# Patient Record
Sex: Female | Born: 1945 | Race: White | Hispanic: No | Marital: Married | Smoking: Former smoker
Health system: Southern US, Community
[De-identification: ages and names within clinical notes are randomized; demographics above are authoritative.]

## PROBLEM LIST (undated history)

## (undated) DIAGNOSIS — G43909 Migraine, unspecified, not intractable, without status migrainosus: Secondary | ICD-10-CM

## (undated) DIAGNOSIS — F1020 Alcohol dependence, uncomplicated: Secondary | ICD-10-CM

## (undated) DIAGNOSIS — E079 Disorder of thyroid, unspecified: Secondary | ICD-10-CM

## (undated) DIAGNOSIS — IMO0001 Reserved for inherently not codable concepts without codable children: Secondary | ICD-10-CM

## (undated) DIAGNOSIS — E039 Hypothyroidism, unspecified: Secondary | ICD-10-CM

## (undated) DIAGNOSIS — M1990 Unspecified osteoarthritis, unspecified site: Secondary | ICD-10-CM

## (undated) HISTORY — PX: KNEE ARTHROSCOPY: SHX127

## (undated) HISTORY — PX: JOINT REPLACEMENT: SHX530

## (undated) HISTORY — DX: Disorder of thyroid, unspecified: E07.9

## (undated) HISTORY — DX: Alcohol dependence, uncomplicated: F10.20

## (undated) HISTORY — PX: TONSILLECTOMY: SUR1361

## (undated) HISTORY — DX: Reserved for inherently not codable concepts without codable children: IMO0001

## (undated) HISTORY — DX: Unspecified osteoarthritis, unspecified site: M19.90

## (undated) HISTORY — PX: COLONOSCOPY: SHX5424

---

## 1975-12-04 HISTORY — PX: BREAST BIOPSY: SHX20

## 1975-12-04 HISTORY — PX: TUBAL LIGATION: SHX77

## 1977-12-03 DIAGNOSIS — G43909 Migraine, unspecified, not intractable, without status migrainosus: Secondary | ICD-10-CM

## 1977-12-03 HISTORY — DX: Migraine, unspecified, not intractable, without status migrainosus: G43.909

## 1998-03-16 ENCOUNTER — Other Ambulatory Visit: Admission: RE | Admit: 1998-03-16 | Discharge: 1998-03-16 | Payer: Self-pay | Admitting: Obstetrics and Gynecology

## 1999-03-21 ENCOUNTER — Other Ambulatory Visit: Admission: RE | Admit: 1999-03-21 | Discharge: 1999-03-21 | Payer: Self-pay | Admitting: Obstetrics and Gynecology

## 1999-12-29 ENCOUNTER — Ambulatory Visit (HOSPITAL_BASED_OUTPATIENT_CLINIC_OR_DEPARTMENT_OTHER): Admission: RE | Admit: 1999-12-29 | Discharge: 1999-12-29 | Payer: Self-pay | Admitting: Orthopedic Surgery

## 1999-12-29 ENCOUNTER — Encounter (INDEPENDENT_AMBULATORY_CARE_PROVIDER_SITE_OTHER): Payer: Self-pay | Admitting: *Deleted

## 2000-05-16 ENCOUNTER — Other Ambulatory Visit: Admission: RE | Admit: 2000-05-16 | Discharge: 2000-05-16 | Payer: Self-pay | Admitting: Obstetrics and Gynecology

## 2001-06-03 ENCOUNTER — Other Ambulatory Visit: Admission: RE | Admit: 2001-06-03 | Discharge: 2001-06-03 | Payer: Self-pay | Admitting: Obstetrics and Gynecology

## 2001-07-15 ENCOUNTER — Ambulatory Visit (HOSPITAL_COMMUNITY): Admission: RE | Admit: 2001-07-15 | Discharge: 2001-07-15 | Payer: Self-pay | Admitting: Plastic Surgery

## 2002-03-26 ENCOUNTER — Encounter (INDEPENDENT_AMBULATORY_CARE_PROVIDER_SITE_OTHER): Payer: Self-pay | Admitting: *Deleted

## 2002-07-03 ENCOUNTER — Other Ambulatory Visit: Admission: RE | Admit: 2002-07-03 | Discharge: 2002-07-03 | Payer: Self-pay | Admitting: Obstetrics and Gynecology

## 2002-12-03 HISTORY — PX: SHOULDER ARTHROSCOPY: SHX128

## 2003-03-31 ENCOUNTER — Encounter: Admission: RE | Admit: 2003-03-31 | Discharge: 2003-03-31 | Payer: Self-pay | Admitting: Orthopedic Surgery

## 2003-03-31 ENCOUNTER — Encounter: Payer: Self-pay | Admitting: Orthopedic Surgery

## 2003-08-03 ENCOUNTER — Other Ambulatory Visit: Admission: RE | Admit: 2003-08-03 | Discharge: 2003-08-03 | Payer: Self-pay | Admitting: Obstetrics and Gynecology

## 2004-08-10 ENCOUNTER — Other Ambulatory Visit: Admission: RE | Admit: 2004-08-10 | Discharge: 2004-08-10 | Payer: Self-pay | Admitting: Obstetrics and Gynecology

## 2004-12-27 ENCOUNTER — Ambulatory Visit: Payer: Self-pay | Admitting: Family Medicine

## 2005-04-26 ENCOUNTER — Ambulatory Visit: Payer: Self-pay | Admitting: Family Medicine

## 2005-06-12 ENCOUNTER — Ambulatory Visit: Payer: Self-pay | Admitting: Gastroenterology

## 2005-06-28 ENCOUNTER — Encounter (INDEPENDENT_AMBULATORY_CARE_PROVIDER_SITE_OTHER): Payer: Self-pay | Admitting: *Deleted

## 2005-06-28 ENCOUNTER — Ambulatory Visit: Payer: Self-pay | Admitting: Gastroenterology

## 2005-12-06 ENCOUNTER — Ambulatory Visit: Payer: Self-pay | Admitting: Family Medicine

## 2005-12-06 ENCOUNTER — Encounter: Admission: RE | Admit: 2005-12-06 | Discharge: 2005-12-06 | Payer: Self-pay | Admitting: Family Medicine

## 2005-12-26 ENCOUNTER — Other Ambulatory Visit: Admission: RE | Admit: 2005-12-26 | Discharge: 2005-12-26 | Payer: Self-pay | Admitting: Obstetrics and Gynecology

## 2006-06-20 ENCOUNTER — Ambulatory Visit: Payer: Self-pay | Admitting: Family Medicine

## 2006-07-03 ENCOUNTER — Ambulatory Visit: Payer: Self-pay | Admitting: Family Medicine

## 2007-07-15 ENCOUNTER — Encounter: Payer: Self-pay | Admitting: Family Medicine

## 2007-07-28 ENCOUNTER — Encounter (INDEPENDENT_AMBULATORY_CARE_PROVIDER_SITE_OTHER): Payer: Self-pay

## 2007-08-18 ENCOUNTER — Ambulatory Visit: Payer: Self-pay | Admitting: Family Medicine

## 2007-08-18 LAB — CONVERTED CEMR LAB
Glucose, Urine, Semiquant: NEGATIVE
Protein, U semiquant: NEGATIVE
WBC Urine, dipstick: NEGATIVE
pH: 7

## 2007-08-26 ENCOUNTER — Ambulatory Visit: Payer: Self-pay | Admitting: Family Medicine

## 2007-08-28 LAB — CONVERTED CEMR LAB
Basophils Absolute: 0 10*3/uL (ref 0.0–0.1)
Basophils Relative: 0.6 % (ref 0.0–1.0)
Bilirubin, Direct: 0.1 mg/dL (ref 0.0–0.3)
CO2: 29 meq/L (ref 19–32)
Creatinine, Ser: 0.9 mg/dL (ref 0.4–1.2)
Direct LDL: 105 mg/dL
HCT: 34.9 % — ABNORMAL LOW (ref 36.0–46.0)
Hemoglobin: 11.9 g/dL — ABNORMAL LOW (ref 12.0–15.0)
MCHC: 34.1 g/dL (ref 30.0–36.0)
Monocytes Absolute: 0.3 10*3/uL (ref 0.2–0.7)
Neutrophils Relative %: 60.8 % (ref 43.0–77.0)
Potassium: 4.6 meq/L (ref 3.5–5.1)
RDW: 12.5 % (ref 11.5–14.6)
Sodium: 143 meq/L (ref 135–145)
TSH: 0.81 microintl units/mL (ref 0.35–5.50)
Total Bilirubin: 0.8 mg/dL (ref 0.3–1.2)
Total CHOL/HDL Ratio: 2.5
Total Protein: 5.5 g/dL — ABNORMAL LOW (ref 6.0–8.3)

## 2007-09-23 ENCOUNTER — Encounter: Payer: Self-pay | Admitting: Family Medicine

## 2007-10-08 ENCOUNTER — Encounter: Payer: Self-pay | Admitting: Family Medicine

## 2007-10-16 ENCOUNTER — Telehealth: Payer: Self-pay | Admitting: Family Medicine

## 2008-09-03 DIAGNOSIS — M5137 Other intervertebral disc degeneration, lumbosacral region: Secondary | ICD-10-CM | POA: Insufficient documentation

## 2008-09-06 ENCOUNTER — Ambulatory Visit: Payer: Self-pay | Admitting: Family Medicine

## 2008-09-08 ENCOUNTER — Ambulatory Visit: Payer: Self-pay | Admitting: Family Medicine

## 2008-09-15 ENCOUNTER — Ambulatory Visit: Payer: Self-pay | Admitting: Family Medicine

## 2008-09-15 LAB — CONVERTED CEMR LAB
Nitrite: NEGATIVE
Specific Gravity, Urine: 1.015
WBC Urine, dipstick: NEGATIVE

## 2008-09-28 ENCOUNTER — Ambulatory Visit: Payer: Self-pay | Admitting: Family Medicine

## 2008-09-28 DIAGNOSIS — M479 Spondylosis, unspecified: Secondary | ICD-10-CM | POA: Insufficient documentation

## 2008-09-28 DIAGNOSIS — M899 Disorder of bone, unspecified: Secondary | ICD-10-CM | POA: Insufficient documentation

## 2008-09-28 DIAGNOSIS — M949 Disorder of cartilage, unspecified: Secondary | ICD-10-CM

## 2008-09-28 DIAGNOSIS — E039 Hypothyroidism, unspecified: Secondary | ICD-10-CM | POA: Insufficient documentation

## 2008-09-29 LAB — CONVERTED CEMR LAB
ALT: 32 units/L (ref 0–35)
AST: 34 units/L (ref 0–37)
Albumin: 3.7 g/dL (ref 3.5–5.2)
BUN: 16 mg/dL (ref 6–23)
Basophils Relative: 1 % (ref 0.0–3.0)
Chloride: 106 meq/L (ref 96–112)
Creatinine, Ser: 0.9 mg/dL (ref 0.4–1.2)
Direct LDL: 119.5 mg/dL
Eosinophils Absolute: 0.1 10*3/uL (ref 0.0–0.7)
Eosinophils Relative: 2.4 % (ref 0.0–5.0)
GFR calc non Af Amer: 68 mL/min
Glucose, Bld: 90 mg/dL (ref 70–99)
HCT: 37.6 % (ref 36.0–46.0)
MCV: 97.5 fL (ref 78.0–100.0)
Monocytes Relative: 9.5 % (ref 3.0–12.0)
Neutrophils Relative %: 47.8 % (ref 43.0–77.0)
RBC: 3.86 M/uL — ABNORMAL LOW (ref 3.87–5.11)
Total Protein: 5.9 g/dL — ABNORMAL LOW (ref 6.0–8.3)
WBC: 5.3 10*3/uL (ref 4.5–10.5)

## 2008-12-07 ENCOUNTER — Ambulatory Visit: Payer: Self-pay | Admitting: Family Medicine

## 2008-12-07 DIAGNOSIS — M129 Arthropathy, unspecified: Secondary | ICD-10-CM | POA: Insufficient documentation

## 2009-04-18 ENCOUNTER — Ambulatory Visit: Payer: Self-pay | Admitting: Internal Medicine

## 2009-04-18 DIAGNOSIS — J069 Acute upper respiratory infection, unspecified: Secondary | ICD-10-CM | POA: Insufficient documentation

## 2009-11-02 ENCOUNTER — Ambulatory Visit: Payer: Self-pay | Admitting: Family Medicine

## 2009-11-15 ENCOUNTER — Ambulatory Visit: Payer: Self-pay | Admitting: Family Medicine

## 2009-11-15 DIAGNOSIS — M19049 Primary osteoarthritis, unspecified hand: Secondary | ICD-10-CM | POA: Insufficient documentation

## 2009-11-21 LAB — CONVERTED CEMR LAB
ALT: 28 units/L (ref 0–35)
Albumin: 3.6 g/dL (ref 3.5–5.2)
Basophils Relative: 1.4 % (ref 0.0–3.0)
Bilirubin Urine: NEGATIVE
CO2: 30 meq/L (ref 19–32)
Chloride: 109 meq/L (ref 96–112)
Cholesterol: 204 mg/dL — ABNORMAL HIGH (ref 0–200)
Direct LDL: 104.6 mg/dL
Eosinophils Absolute: 0.1 10*3/uL (ref 0.0–0.7)
Eosinophils Relative: 3.3 % (ref 0.0–5.0)
HCT: 35.4 % — ABNORMAL LOW (ref 36.0–46.0)
Hemoglobin, Urine: NEGATIVE
Hemoglobin: 12 g/dL (ref 12.0–15.0)
MCHC: 33.9 g/dL (ref 30.0–36.0)
MCV: 97.2 fL (ref 78.0–100.0)
Monocytes Absolute: 0.4 10*3/uL (ref 0.1–1.0)
Neutro Abs: 2.2 10*3/uL (ref 1.4–7.7)
Nitrite: NEGATIVE
Potassium: 4.4 meq/L (ref 3.5–5.1)
RBC: 3.64 M/uL — ABNORMAL LOW (ref 3.87–5.11)
Sodium: 142 meq/L (ref 135–145)
Total CHOL/HDL Ratio: 2
Total Protein, Urine: NEGATIVE mg/dL
Total Protein: 5.9 g/dL — ABNORMAL LOW (ref 6.0–8.3)
Urine Glucose: NEGATIVE mg/dL
Urobilinogen, UA: 0.2 (ref 0.0–1.0)
VLDL: 4.8 mg/dL (ref 0.0–40.0)
WBC: 4 10*3/uL — ABNORMAL LOW (ref 4.5–10.5)

## 2010-02-15 ENCOUNTER — Ambulatory Visit: Payer: Self-pay | Admitting: Family Medicine

## 2010-02-15 DIAGNOSIS — K589 Irritable bowel syndrome without diarrhea: Secondary | ICD-10-CM | POA: Insufficient documentation

## 2010-02-15 DIAGNOSIS — K6289 Other specified diseases of anus and rectum: Secondary | ICD-10-CM | POA: Insufficient documentation

## 2010-03-07 ENCOUNTER — Telehealth: Payer: Self-pay | Admitting: Family Medicine

## 2010-03-08 ENCOUNTER — Telehealth: Payer: Self-pay | Admitting: Internal Medicine

## 2010-03-14 ENCOUNTER — Ambulatory Visit: Payer: Self-pay | Admitting: Internal Medicine

## 2010-03-14 DIAGNOSIS — Z860100 Personal history of colon polyps, unspecified: Secondary | ICD-10-CM | POA: Insufficient documentation

## 2010-03-14 DIAGNOSIS — R159 Full incontinence of feces: Secondary | ICD-10-CM | POA: Insufficient documentation

## 2010-03-14 DIAGNOSIS — K625 Hemorrhage of anus and rectum: Secondary | ICD-10-CM | POA: Insufficient documentation

## 2010-03-16 DIAGNOSIS — R198 Other specified symptoms and signs involving the digestive system and abdomen: Secondary | ICD-10-CM | POA: Insufficient documentation

## 2010-03-31 ENCOUNTER — Ambulatory Visit: Payer: Self-pay | Admitting: Internal Medicine

## 2010-03-31 LAB — HM COLONOSCOPY

## 2010-08-03 ENCOUNTER — Telehealth: Payer: Self-pay | Admitting: Family Medicine

## 2010-08-21 ENCOUNTER — Encounter: Payer: Self-pay | Admitting: Family Medicine

## 2010-09-01 ENCOUNTER — Encounter: Payer: Self-pay | Admitting: Family Medicine

## 2010-10-05 ENCOUNTER — Telehealth: Payer: Self-pay | Admitting: Family Medicine

## 2010-10-18 ENCOUNTER — Ambulatory Visit (HOSPITAL_BASED_OUTPATIENT_CLINIC_OR_DEPARTMENT_OTHER): Admission: RE | Admit: 2010-10-18 | Discharge: 2010-10-18 | Payer: Self-pay | Admitting: Orthopedic Surgery

## 2011-01-02 NOTE — Procedures (Signed)
Summary: Sanda Klein, MD   Colonoscopy  Procedure date:  06/28/2010  Findings:      Location:  [facility].  Results: Normal.  Patient Name: Erin Carroll, Erin Carroll MRN: [id number] Procedure Procedures: Colonoscopy CPT: [phone].  Personnel: Endoscopist: Ulyess Mort, MD.  Exam Location: Exam performed in Outpatient Clinic. Outpatient  Patient Consent: Procedure, Alternatives, Risks and Benefits discussed, consent obtained, from patient. Consent was obtained by the RN.  Indications  Surveillance of: Adenomatous Polyp(s).  History  Current Medications: Patient is not currently taking Coumadin.  Pre-Exam Physical: Performed Mar 26, 2002. Entire physical exam was normal. Rectal exam, Abdominal exam, Extremity exam, Mental status exam WNL.  Exam Exam: Extent of exam reached: Cecum, extent intended: Cecum.  The cecum was identified by appendiceal orifice and IC valve. Colon retroflexion performed. Images were not taken. ASA Classification: II. Tolerance: excellent.  Monitoring: Pulse and BP monitoring, Oximetry used. Supplemental O2 given.  Sedation Meds: Patient assessed and found to be appropriate for moderate (conscious) sedation. Fentanyl given IV. Versed given IV.  Findings - NOT SEEN ON EXAM: Cecum to Rectum. Polyps, AVM's, Colitis, Tumors, Melanosis, Crohn's, Diverticulosis,   Assessment Normal examination.  Events  Unplanned Interventions: No intervention was required.  Unplanned Events: There were no complications. Plans Medication Plan: Continue current medications.  Patient Education: Patient given standard instructions for: a normal exam. Yearly hemoccult testing recommended. Patient instructed to get routine colonoscopy every 5 years.  Disposition: After procedure patient sent to recovery. After recovery patient sent home.   This report was created from the original endoscopy report, which was reviewed and signed by the  above listed endoscopist.   cc:  Dianna Limbo, MD

## 2011-01-02 NOTE — Progress Notes (Signed)
Summary: rectal leakage not better LMTCB 4-5  Phone Note Call from Patient Call back at [facility] Phone [phone]   Summary of Call: Rectal leakage not significantly better on Hyoscyamine.  Also now seeing an occasional smear of blood on toliet paper, red & not old blood.  No pain.  Still urgency at times & then leakage.  Some leakage without urgency.  No fever.  No constipation.  Not diarrhea, but loose.  3BM's in am, a change for her over time.  [facility]  NKDA.   Initial call taken by: Rudy Jew, RN,  March 07, 2010 2:58 PM  Follow-up for Phone Call        will refer to GI doctor --has she been seen by GI for anything in past if we will use same one  Follow-up by: Pura Spice, RN,  March 07, 2010 3:05 PM  Additional Follow-up for Phone Call Additional follow up Details #1::        No answer. Rudy Jew, RN  March 07, 2010 3:10 PM Left message to call back. Rudy Jew, RN  March 07, 2010 3:39 PM      Additional Follow-up for Phone Call Additional follow up Details #2::    pt returned call and per dr Scotty Court refer to Dr Marina Goodell. Pt aware that Camelia Eng will call with appt.  Follow-up by: Pura Spice, RN,  March 08, 2010 11:06 AM

## 2011-01-02 NOTE — Procedures (Signed)
Summary: Colonoscopy  Patient: Erin Carroll Note: All result statuses are Final unless otherwise noted.  Tests: (1) Colonoscopy (COL)   COL Colonoscopy           DONE     [facility]     [address]     [place], [place]  [address]           COLONOSCOPY PROCEDURE REPORT           PATIENT:  [name], [name]  MR#:  [id number]     BIRTHDATE:  [birth year], 64 yrs. old  GENDER:  female     ENDOSCOPIST:  Wilhemina Bonito. Eda Keys, MD     REF. BY:  Office     PROCEDURE DATE:  03/31/2010     PROCEDURE:  Surveillance Colonoscopy     ASA CLASS:  Class II     INDICATIONS:  history of pre-cancerous (adenomatous) colon polyps     ; INDEX 2003 W/ ADENOMA; F/U 2006 NEG.     ALSO C/O INCONTINENCE     MEDICATIONS:   Fentanyl 75 mcg IV, Versed 9 mg IV           DESCRIPTION OF PROCEDURE:   After the risks benefits and     alternatives of the procedure were thoroughly explained, informed     consent was obtained.  Digital rectal exam was performed and     revealed no abnormalities.   The LB CF-[id number] endoscope     was introduced through the anus and advanced to the cecum, which     was identified by both the appendix and ileocecal valve, without     limitations.TIME TO CECUM = 3:38 MIN. The quality of the prep was     good, using MoviPrep.  The instrument was then slowly withdrawn     (TIME = 10:15 MIN.)as the colon was fully examined.     <<PROCEDUREIMAGES>>           FINDINGS:  A normal appearing cecum, ileocecal valve, and     appendiceal orifice were identified. The ascending, hepatic     flexure, transverse, splenic flexure, descending, sigmoid colon,     and rectum appeared unremarkable.  No polyps or cancers were seen.     Retroflexed views in the rectum revealed no abnormalities.    The     scope was then withdrawn from the patient and the procedure     completed.           COMPLICATIONS:  None     ENDOSCOPIC IMPRESSION:     1) Normal colon     2) No polyps or cancers  RECOMMENDATIONS:     1) Follow up colonoscopy in 5 years           ______________________________     Wilhemina Bonito. Eda Keys, MD           CC:  Dianna Limbo, MD;The Patient           n.     Rosalie DoctorWilhemina Bonito. Eda Keys at 03/31/2010 05:09 PM           Golden Hurter, [phone]  Note: An exclamation mark (!) indicates a result that was not dispersed into the flowsheet. Document Creation Date: 03/31/2010 5:09 PM _______________________________________________________________________  (1) Order result status: Final Collection or observation date-time: 03/31/2010 17:02 Requested date-time:  Receipt date-time:  Reported date-time:  Referring Physician:   Ordering Physician: Fransico Setters [phone]) Specimen Source:  Source: Launa Grill Order Number: [id number] Lab site:   Appended Document: Colonoscopy    Clinical Lists Changes  Observations: Added new observation of COLONNXTDUE: 03/2015 (03/31/2010 17:48)      Appended Document: Colonoscopy reviewed

## 2011-01-02 NOTE — Letter (Signed)
Summary: [facility]  [facility]   Imported By: Maryln Gottron 08/28/2010 15:44:26  _____________________________________________________________________  External Attachment:    Type:   Image     Comment:   External Document

## 2011-01-02 NOTE — Progress Notes (Signed)
Summary: Vitamin D level  Phone Note Call from Patient Call back at Home Phone [phone] Call back at Work Phone [phone]   Caller: Patient Call For: Judithann Sheen MD Summary of Call: Pt needs to know her last Vitamin D level.  Initial call taken by: Lynann Beaver CMA,  August 03, 2010 10:14 AM  Follow-up for Phone Call        last vit d level was in 2009 and was at 35 no tx was needed thanks Follow-up by: Pura Spice, RN,  August 03, 2010 10:39 AM  Additional Follow-up for Phone Call Additional follow up Details #1::        Left message on personal voice mai. Additional Follow-up by: Lynann Beaver CMA,  August 03, 2010 10:44 AM

## 2011-01-02 NOTE — Progress Notes (Signed)
Summary: Celebrex refill  Phone Note Call from Patient   Caller: Patient Call For: Judithann Sheen MD Summary of Call: [facility]) [phone] Needs Celebrex refill ASAP!  She works at [employer] and is almost out of her meds.  Please call if you have any questions. Initial call taken by: [facility] CMA AAMA,  October 05, 2010 2:38 PM    Prescriptions: CELEBREX 200 MG CAPS (CELECOXIB) 1 capsule two times a day  #180 Capsule x 0   Entered by:   Josph Macho RMA   Authorized by:   Danise Edge MD   Signed by:   Josph Macho RMA on 10/05/2010   Method used:   [facility]ronically to        [facility] [phone]* (retail)       [address]       [place], [place]  [address]       Ph: [phone]       Fax: [phone]   RxID:   [id number]   Appended Document: Celebrex refill Patient informed and states its there

## 2011-01-02 NOTE — Assessment & Plan Note (Signed)
Summary: GI CONCERNS / PAIN / BLOATING // RS   Vital Signs:  Patient profile:   65 year old female Weight:      118 pounds O2 Sat:      95120 % Temp:     98 degrees F Pulse rate:   80 / minute BP sitting:   120 / 76  (left arm) Cuff size:   regular  Vitals Entered By: Pura Spice, RN (February 15, 2010 2:25 PM) CC: rectal incont.    History of Present Illness: This 65 year old white married female is in today complaining of light rectal incontinence or seepage of stool when she when she bends over or has any abdominal pressure she also complaints of some ROM and hand slight discomfort over the lower abdomen. As several bowel movements during the day multiple culture and formed stool however with his seepage he has some mucus with the stool, has not seen any blood at any time. Lungs Examination is up-to-date there no upper GI symptoms  Allergies (verified): No Known Drug Allergies  Past History:  Past Medical History: Last updated: 06/17/2007 Thyroid problems  Past Surgical History: Last updated: 06/17/2007 Lt knee arthroscopy-1987/1991 Lt Shoulder arthroscopy-2004 Breast bx-1977 Colonoscopy-06/2005  Social History: Last updated: 04/18/2009 Married Former Smoker Alcohol use-no Drug use-no Regular exercise-yes Occupation:the pharmacist    Risk Factors: Smoking Status: quit (04/18/2009) Passive Smoke Exposure: no (04/18/2009)  Review of Systems  The patient denies anorexia, fever, weight loss, weight gain, vision loss, decreased hearing, hoarseness, chest pain, syncope, dyspnea on exertion, peripheral edema, prolonged cough, headaches, hemoptysis, abdominal pain, melena, hematochezia, severe indigestion/heartburn, hematuria, incontinence, genital sores, muscle weakness, suspicious skin lesions, transient blindness, difficulty walking, depression, unusual weight change, abnormal bleeding, enlarged lymph nodes, angioedema, breast masses, and testicular masses.     Physical Exam  General:  Well-developed,well-nourished,in no acute distress; alert,appropriate and cooperative throughout examination Lungs:  Normal respiratory effort, chest expands symmetrically. Lungs are clear to auscultation, no crackles or wheezes. Heart:  Normal rate and regular rhythm. S1 and S2 normal without gallop, murmur, click, rub or other extra sounds. 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 1 qd  Complete Medication List: 1)  Meloxicam 15 Mg Tabs (Meloxicam) .... One by mouth daily 2)  Synthroid 75 Mcg Tabs (Levothyroxine sodium) .Marland Kitchen.. 1 qd 3)  Hyoscyamine Sulfate 0.125 Mg Tabs (Hyoscyamine sulfate) .Marland Kitchen.. 1 morn, luch and bedtime for irritable bowel  Patient Instructions: 1)  I feel the rectal seepage is secondary to beginning year-old bowel syndrome and possible early proctitis if there is new improvement we'll treat the proctitis with Analpram-HC crem 2)  Continue eating yogurt 3)  Start Hyoscamine .125 mg  3 times daily before meals Prescriptions: HYOSCYAMINE SULFATE 0.125 MG TABS (HYOSCYAMINE SULFATE) 1 morn, luch and bedtime for irritable bowel  #90 x 1   Entered and Authorized by:   Ivar Drape  Danice Goltz MD   Signed by:   Judithann Sheen MD on 02/15/2010   Method used:   Electronically to         [facility] [phone]* (retail)       [address]       [place], [place]  [address]       Ph: [phone]       Fax: [phone]   RxID:   [id number]

## 2011-01-02 NOTE — Procedures (Signed)
Summary: Sanda Klein, MD   Colonoscopy  Procedure date:  03/26/2002  Findings:      [facility]  Pathology:  Adenomatous polyp.          Procedures Next Due Date:    Colonoscopy: 04/2005 Patient Name: Erin Carroll, Erin Carroll MRN: [id number] Procedure Procedures: Colonoscopy CPT: 72536.    with Hot Biopsy(s)CPT: Z451292.  Colorectal cancer screening, average risk CPT: G0121.  Personnel: Endoscopist: Ulyess Mort, MD.  Referred By: Rogene Houston, MD.  Exam Location: Exam performed in Outpatient Clinic. Outpatient  Patient Consent: Procedure, Alternatives, Risks and Benefits discussed, consent obtained, from patient. Consent was obtained by the RN.  Indications  Average Risk Screening Routine.  History  Pre-Exam Physical: Performed Mar 26, 2002. Rectal exam, Abdominal exam, Extremity exam, Mental status exam WNL.  Exam Exam: Extent of exam reached: Cecum, extent intended: Cecum.  The cecum was identified by appendiceal orifice and IC valve. Colon retroflexion performed. Images were not taken. ASA Classification: I. Tolerance: good.  Monitoring: Pulse and BP monitoring, Oximetry used. Supplemental O2 given.  Colon Prep Prep results: fair, exam compromised.  Sedation Meds: Patient assessed and found to be appropriate for moderate (conscious) sedation. Fentanyl 100 mcg. given IV. Versed 5 mg. given IV.  Findings POLYP: Ascending Colon, Maximum size: 6 mm. sessile polyp. Procedure:  hot biopsy, removed, retrieved, Polyp sent to pathology. ICD9: Colon Polyps: 211.3.  - NOT SEEN ON EXAM: Cecum to Rectum. AVM's, Colitis, Tumors, Melanosis, Crohn's, Diverticulosis, Hemorrhoids,   Assessment Abnormal examination, see findings above.  Diagnoses: 211.3: Colon Polyps.   Events  Unplanned Interventions: No intervention was required.  Unplanned Events: There were no complications. Plans Medication Plan: Continue current  medications.  Patient Education: Patient given standard instructions for: Polyps. Yearly hemoccult testing recommended. Patient instructed to get routine colonoscopy every 3 years.  Disposition: After procedure patient sent to recovery. After recovery patient sent home.   This report was created from the original endoscopy report, which was reviewed and signed by the above listed endoscopist.   cc:  Rogene Houston, MD

## 2011-01-02 NOTE — Assessment & Plan Note (Signed)
Summary: rectal seepage -Ref.by Dr.Stafford    History of Present Illness Visit Type: new pateint  Primary GI MD: Yancey Flemings MD Primary Provider: Valarie Merino, MD  Requesting Provider: n/a Chief Complaint: fecal incontinence and rectal bleeding  History of Present Illness:   65 year old female with a history of hypothyroidism, osteoarthritis, remote alcoholism, and adenomatous colon polyps. She is seen today regarding change in bowel habits, fecal incontinence, and minor rectal bleeding. Her index colonoscopy in 2003 revealed adenomatous colon polyps. Followup colonoscopy in 2006 revealed no polyps. She is due for followup this year. The patient's present history is that of change in bowel habits over a six-month period. She describes rectal seepage of liquid and frank incontinence when bending. She also describes a sensation of rectal prolapse or fullness. The symptoms are frequent in terms of seepage and infrequent in terms of frank incontinence. She cannot identify any factors that improve the situation, though Valsalva seems to exacerbate symptoms. Recently she mentions trivial amounts of bleeding on the tissue only. She has been treated with Levsin but this did not help significantly. Her GI review of systems otherwise negativeshe denies urinary incontinence or back pain. Her chronic medical problems are stable. She is employed as a Teacher, early years/pre at [employer].   GI Review of Systems      Denies abdominal pain, acid reflux, belching, bloating, chest pain, dysphagia with liquids, dysphagia with solids, heartburn, loss of appetite, nausea, vomiting, vomiting blood, weight loss, and  weight gain.      Reports change in bowel habits, fecal incontinence, and  rectal bleeding.     Denies anal fissure, black tarry stools, constipation, diarrhea, diverticulosis, heme positive stool, hemorrhoids, irritable bowel syndrome, jaundice, light color stool, liver problems, and  rectal pain.    Current  Medications (verified): 1)  Meloxicam 15 Mg Tabs (Meloxicam) .... One By Mouth Daily 2)  Synthroid 75 Mcg Tabs (Levothyroxine Sodium) .Marland Kitchen.. 1 Qd 3)  Hyoscyamine Sulfate 0.125 Mg Tabs (Hyoscyamine Sulfate) .Marland Kitchen.. 1 Morn, Luch and Bedtime For Irritable Bowel 4)  Aspir-Low 81 Mg Tbec (Aspirin) .... One Tablet By Mouth Once Daily 5)  Transdermal Patch 2" X 3"  Ptch (Transdermal Patch) .... Change Twice A Week 6)  Multivitamins   Tabs (Multiple Vitamin) .... One Tablet By Mouth Once Daily  Allergies (verified): No Known Drug Allergies  Past History:  Past Medical History: Thyroid problems Alcoholism--Sober since 1996  Past Surgical History: Reviewed history from 06/17/2007 and no changes required. Lt knee arthroscopy-1987/1991 Lt Shoulder arthroscopy-2004 Breast bx-1977 Colonoscopy-06/2005  Family History: Family History High cholesterol Family History Hypertension Family History of Cardiovascular disorder: Mom and Dad  No FH of Colon Cancer:  Social History: Pharmacist  Married Former Smoker Alcohol use-no Drug use-no Regular exercise-yes  Daily Caffeine Use: six cups of coffee daily   Review of Systems  The patient denies allergy/sinus, anemia, anxiety-new, arthritis/joint pain, back pain, blood in urine, breast changes/lumps, change in vision, confusion, cough, coughing up blood, depression-new, fainting, fatigue, fever, headaches-new, hearing problems, heart murmur, heart rhythm changes, itching, menstrual pain, muscle pains/cramps, night sweats, nosebleeds, pregnancy symptoms, shortness of breath, skin rash, sleeping problems, sore throat, swelling of feet/legs, swollen lymph glands, thirst - excessive , urination - excessive , urination changes/pain, urine leakage, vision changes, and voice change.    Vital Signs:  Patient profile:   65 year old female Height:      62 inches Weight:      117 pounds BMI:     21.48  BSA:     1.52 Pulse rate:   88 / minute Pulse rhythm:    regular BP sitting:   128 / 76  (left arm) Cuff size:   regular  Vitals Entered By: Ok Anis CMA (March 14, 2010 11:24 AM)  Physical Exam  General:  Well developed, well nourished, no acute distress. Head:  Normocephalic and atraumatic. Eyes:  PERRLA, no icterus. Ears:  Normal auditory acuity. Nose:  No deformity, discharge,  or lesions. Mouth:  No deformity or lesions, dentition normal. Neck:  Supple; no masses or thyromegaly. Lungs:  Clear throughout to auscultation. Heart:  Regular rate and rhythm; no murmurs, rubs,  or bruits. Abdomen:  Soft, nontender and nondistended. No masses, hepatosplenomegaly or hernias noted. Normal bowel sounds. Rectal:  external sensation is intact. Rectal tone normal. No tenderness or mass. Brown Hemoccult negative stool. Msk:  Symmetrical with no gross deformities. Normal posture. Pulses:  Normal pulses noted. Extremities:  No clubbing, cyanosis, edema or deformities noted. Neurologic:  Alert and  oriented x4;  grossly normal neurologically. Skin:  Intact without significant lesions or rashes. Cervical Nodes:  No significant cervical adenopathy.no supraclavicular adenopathy Psych:  Alert and cooperative. Normal mood and affect.   Impression & Recommendations:  Problem # 1:  FECAL INCONTINENCE (ICD-787.6) intermittent fecal incontinence with intact sphincter and sensation.  Plan #1. Bulk stools with fiber #2. Protective undergarments #3. Colonoscopy to exclude anatomic abnormality for symptom as well as provide neoplasia surveillance for which she is due. See below  Problem # 2:  OTHER SYMPTOMS INVOLVING DIGESTIVE SYSTEM OTHER (ICD-787.99) see above  Problem # 3:  RECTAL BLEEDING (ICD-569.3) minor. No abnormalities on physical exam today. Further evaluation with colonoscopy see below  Problem # 4:  PERSONAL HX COLONIC POLYPS (ICD-V12.72) personal history of adenomatous colon polyps in 2003. Negative exam in 2006. Now due for followup  surveillance.  .Plan: #1. Colonoscopy. The nature of the procedure as well as the risks, benefits, and alternatives were reviewed. She understood and agreed to proceed. #2. Movi prep prescribed. The patient instructed on its use.  Patient Instructions: 1)  Colonoscopy [facility] 03/31/10 [facility] 4:00 pm arrive at 3:00 pm 2)  Movi prep instructions given to pt. and Rx. sent to your pharmacy for you to pick up. 3)  Colonoscopy brochure given.  4)  Copy sent to : Rickard Patience, MD 5)  The medication list was reviewed and reconciled.  All changed / newly prescribed medications were explained.  A complete medication list was provided to the patient / caregiver. 6)  printed and given to patient. Milford Cage [facility]  March 14, 2010 12:00 PM Prescriptions: MOVIPREP 100 GM  SOLR (PEG-KCL-NACL-NASULF-NA ASC-C) As per prep instructions.  #1 x 0   Entered by:   Milford Cage NCMA   Authorized by:   Hilarie Fredrickson MD   Signed by:   Milford Cage NCMA on 03/14/2010   Method used:   Electronically to        [facility] [phone][facility] (retail)       [address]       [place], [place]  [address]       Ph: [phone]       Fax: [phone]   RxID:   [id number]   Appended Document: rectal seepage -Ref.by Dr.Stafford ADDENDUM: Review of outside laboratories from December 2010 show unremarkable CBC and comprehensive metabolic panel as well as TSH and urinalysis  Appended Document:  rectal seepage -Ref.by Dr.Stafford [email or web address]

## 2011-01-02 NOTE — Progress Notes (Signed)
Summary: Triage; loose stools not diarrhea   Phone Note From Other Clinic   Call For:   Caller: Terr @ Dr Charmian Muff office [phone] Call For: Dr Marina Goodell Reason for Call: Schedule Patient Appt Summary of Call: Red blood on toilet paper, some leakage.loose stools not diarrhea.  Former pt of Dr Corinda Gubler and Dr Scotty Court would like patient seen by Dr Marina Goodell sooner than next available please. Initial call taken by: Leanor Kail [facility],  March 08, 2010 3:31 PM  Follow-up for Phone Call        Message left on Teri's voice mail that appt. is 03/14/10 at 11 am and she needs to contact pt. Follow-up by: Teryl Lucy RN,  March 08, 2010 3:48 PM

## 2011-01-02 NOTE — Letter (Signed)
Summary: [facility] Instructions  [facility]  [address] [place], [place] [address]   Phone: [phone]  Fax: [phone]       Erin Carroll    March 21, 1960    MRN: [id number]        Procedure Day /Date:FRIDAY, 03/31/10     Arrival Time:3:00 PM     Procedure Time:4:00 PM     Location of Procedure:                    X  [facility] (4th Floor)   PREPARATION FOR COLONOSCOPY WITH MOVIPREP   Starting 5 days prior to your procedure 03/26/10 do not eat nuts, seeds, popcorn, corn, beans, peas,  salads, or any raw vegetables.  Do not take any fiber supplements (e.g. Metamucil, Citrucel, and Benefiber).  THE DAY BEFORE YOUR PROCEDURE         DATE:03/30/10  DAY: THURSDAY  1.  Drink clear liquids the entire day-NO SOLID FOOD  2.  Do not drink anything colored red or purple.  Avoid juices with pulp.  No orange juice.  3.  Drink at least 64 oz. (8 glasses) of fluid/clear liquids during the day to prevent dehydration and help the prep work efficiently.  CLEAR LIQUIDS INCLUDE: Water Jello Ice Popsicles Tea (sugar ok, no milk/cream) Powdered fruit flavored drinks Coffee (sugar ok, no milk/cream) Gatorade Juice: apple, white grape, white cranberry  Lemonade Clear bullion, consomm, broth Carbonated beverages (any kind) Strained chicken noodle soup Hard Candy                             4.  In the morning, mix first dose of MoviPrep solution:    Empty 1 Pouch A and 1 Pouch B into the disposable container    Add lukewarm drinking water to the top line of the container. Mix to dissolve    Refrigerate (mixed solution should be used within 24 hrs)  5.  Begin drinking the prep at 5:00 p.m. The MoviPrep container is divided by 4 marks.   Every 15 minutes drink the solution down to the next mark (approximately 8 oz) until the full liter is complete.   6.  Follow completed prep with 16 oz of clear liquid of your choice (Nothing red or purple).  Continue  to drink clear liquids until bedtime.  7.  Before going to bed, mix second dose of MoviPrep solution:    Empty 1 Pouch A and 1 Pouch B into the disposable container    Add lukewarm drinking water to the top line of the container. Mix to dissolve    Refrigerate  THE DAY OF YOUR PROCEDURE      DATE: 03/31/10 DAY: FRIDAY  Beginning at 11:00 a.m. (5 hours before procedure):         1. Every 15 minutes, drink the solution down to the next mark (approx 8 oz) until the full liter is complete.  2. Follow completed prep with 16 oz. of clear liquid of your choice.    3. You may drink clear liquids until 2:00 PM (2 HOURS BEFORE PROCEDURE).   MEDICATION INSTRUCTIONS  Unless otherwise instructed, you should take regular prescription medications with a small sip of water   as early as possible the morning of your procedure.         OTHER INSTRUCTIONS  You will need a responsible adult at least 63 years of age to  accompany you and drive you home.   This person must remain in the waiting room during your procedure.  Wear loose fitting clothing that is easily removed.  Leave jewelry and other valuables at home.  However, you may wish to bring a book to read or  an iPod/MP3 player to listen to music as you wait for your procedure to start.  Remove all body piercing jewelry and leave at home.  Total time from sign-in until discharge is approximately 2-3 hours.  You should go home directly after your procedure and rest.  You can resume normal activities the  day after your procedure.  The day of your procedure you should not:   Drive   Make legal decisions   Operate machinery   Drink alcohol   Return to work  You will receive specific instructions about eating, activities and medications before you leave.    The above instructions have been reviewed and explained to me by   _______________________    I fully understand and can verbalize these instructions  _____________________________ Date _________

## 2011-02-12 ENCOUNTER — Encounter: Payer: Self-pay | Admitting: Internal Medicine

## 2011-02-12 ENCOUNTER — Ambulatory Visit (INDEPENDENT_AMBULATORY_CARE_PROVIDER_SITE_OTHER): Payer: BC Managed Care – PPO | Admitting: Internal Medicine

## 2011-02-12 VITALS — BP 110/70 | Temp 98.4°F | Wt 123.0 lb

## 2011-02-12 DIAGNOSIS — J029 Acute pharyngitis, unspecified: Secondary | ICD-10-CM

## 2011-02-12 LAB — POCT RAPID STREP A (OFFICE): Rapid Strep A Screen: NEGATIVE

## 2011-02-12 NOTE — Progress Notes (Signed)
  Subjective:    Patient ID: Erin Carroll, female    DOB: August 04, 1946, 65 y.o.   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 She is exposed to multiple ill individuals throughout her workday. She had fever yesterday which was low grade her chief complaint is sore throat but today has developed some slight hoarseness. A rapid strep negative associated symptoms include some mild myalgias and chills    Review of Systems  Constitutional: Positive for fever, chills and fatigue.       Objective:   Physical Exam  Constitutional: She is oriented to person, place, and time. She appears well-developed and well-nourished.  HENT:  Head: Normocephalic.  Right Ear: External ear normal.  Left Ear: External ear normal.        Mild erythema of the oropharynx without cervical adenopathy. There is no exudate noted  Eyes: Conjunctivae and EOM are normal. Pupils are equal, round, and reactive to light.  Neck: Normal range of motion. Neck supple. No thyromegaly present.  Cardiovascular: Normal rate, regular rhythm, normal heart sounds and intact distal pulses.   Pulmonary/Chest: Effort normal and breath sounds normal.  Abdominal: Soft. Bowel sounds are normal. She exhibits no mass. There is no tenderness.  Musculoskeletal: Normal range of motion.  Lymphadenopathy:    She has no cervical adenopathy.  Neurological: She is alert and oriented to person, place, and time.  Skin: Skin is warm and dry. No rash noted.  Psychiatric: She has a normal mood and affect. Her behavior is normal.          Assessment & Plan:   viral pharyngitis. We'll continue symptomatic treatment with Alka-Seltzer plus. She will add Cepacol lozenge or

## 2011-02-12 NOTE — Patient Instructions (Signed)
Get plenty of rest, Drink lots of  clear liquids, and use Tylenol or ibuprofen for fever and discomfort.    

## 2011-02-19 ENCOUNTER — Encounter: Payer: Self-pay | Admitting: Family Medicine

## 2011-02-28 ENCOUNTER — Other Ambulatory Visit (INDEPENDENT_AMBULATORY_CARE_PROVIDER_SITE_OTHER): Payer: BC Managed Care – PPO | Admitting: Family Medicine

## 2011-02-28 DIAGNOSIS — Z0000 Encounter for general adult medical examination without abnormal findings: Secondary | ICD-10-CM

## 2011-02-28 LAB — HEPATIC FUNCTION PANEL
AST: 39 U/L — ABNORMAL HIGH (ref 0–37)
Albumin: 3.7 g/dL (ref 3.5–5.2)
Alkaline Phosphatase: 57 U/L (ref 39–117)
Bilirubin, Direct: 0.1 mg/dL (ref 0.0–0.3)
Total Bilirubin: 0.1 mg/dL — ABNORMAL LOW (ref 0.3–1.2)

## 2011-02-28 LAB — POCT URINALYSIS DIPSTICK
Glucose, UA: NEGATIVE
Nitrite, UA: NEGATIVE
Spec Grav, UA: 1.015
Urobilinogen, UA: 0.2

## 2011-02-28 LAB — CBC WITH DIFFERENTIAL/PLATELET
Basophils Absolute: 0 10*3/uL (ref 0.0–0.1)
Hemoglobin: 11.8 g/dL — ABNORMAL LOW (ref 12.0–15.0)
Lymphocytes Relative: 29.4 % (ref 12.0–46.0)
Monocytes Relative: 8.7 % (ref 3.0–12.0)
Neutro Abs: 2.3 10*3/uL (ref 1.4–7.7)
Neutrophils Relative %: 56.9 % (ref 43.0–77.0)
RDW: 13.9 % (ref 11.5–14.6)

## 2011-02-28 LAB — BASIC METABOLIC PANEL
Calcium: 9.1 mg/dL (ref 8.4–10.5)
GFR: 85.58 mL/min (ref >60.00)
Glucose, Bld: 78 mg/dL (ref 70–99)
Sodium: 141 mEq/L (ref 135–145)

## 2011-02-28 LAB — TSH: TSH: 0.44 u[IU]/mL (ref 0.35–5.50)

## 2011-02-28 LAB — LDL CHOLESTEROL, DIRECT: Direct LDL: 107.5 mg/dL

## 2011-02-28 LAB — LIPID PANEL
Total CHOL/HDL Ratio: 2
VLDL: 6.2 mg/dL (ref 0.0–40.0)

## 2011-03-08 ENCOUNTER — Ambulatory Visit (INDEPENDENT_AMBULATORY_CARE_PROVIDER_SITE_OTHER): Payer: BC Managed Care – PPO | Admitting: Family Medicine

## 2011-03-08 ENCOUNTER — Encounter: Payer: Self-pay | Admitting: Family Medicine

## 2011-03-08 VITALS — BP 112/70 | HR 91 | Temp 97.9°F | Ht 62.25 in | Wt 117.0 lb

## 2011-03-08 DIAGNOSIS — Z136 Encounter for screening for cardiovascular disorders: Secondary | ICD-10-CM

## 2011-03-08 DIAGNOSIS — Z0000 Encounter for general adult medical examination without abnormal findings: Secondary | ICD-10-CM

## 2011-03-08 DIAGNOSIS — M25549 Pain in joints of unspecified hand: Secondary | ICD-10-CM

## 2011-03-08 DIAGNOSIS — M1990 Unspecified osteoarthritis, unspecified site: Secondary | ICD-10-CM

## 2011-03-08 MED ORDER — LEVOTHYROXINE SODIUM 75 MCG PO TABS: 75.0000 ug | ORAL_TABLET | Freq: Every day | ORAL | Status: DC

## 2011-03-08 MED ORDER — DICLOFENAC SODIUM 75 MG PO TBEC: 75.0000 mg | DELAYED_RELEASE_TABLET | Freq: Two times a day (BID) | ORAL | Status: DC

## 2011-03-08 NOTE — Progress Notes (Signed)
  Subjective:    Patient ID: Erin Carroll, female    DOB: 08-15-1946, 65 y.o.   MRN: [id number] [phone] yr old white married female is in for annual physical examination. Her primary complaints stiffness and painful hands or fingers. In  Nov had arthroscopic surgery rt knee, torn medial meniscus And is doing much better. No  Other complaints. Has continued AA for 16 yrs, doing fine.   HPI    Review of Systems     Objective:   Physical Exam        Assessment & Plan:

## 2011-03-13 ENCOUNTER — Ambulatory Visit (INDEPENDENT_AMBULATORY_CARE_PROVIDER_SITE_OTHER): Payer: BC Managed Care – PPO | Admitting: Internal Medicine

## 2011-03-13 ENCOUNTER — Encounter: Payer: Self-pay | Admitting: Internal Medicine

## 2011-03-13 VITALS — BP 100/60 | HR 85 | Temp 98.8°F | Wt 120.0 lb

## 2011-03-13 DIAGNOSIS — R059 Cough, unspecified: Secondary | ICD-10-CM

## 2011-03-13 DIAGNOSIS — R05 Cough: Secondary | ICD-10-CM

## 2011-03-13 DIAGNOSIS — J042 Acute laryngotracheitis: Secondary | ICD-10-CM | POA: Insufficient documentation

## 2011-03-13 MED ORDER — HYDROCODONE-HOMATROPINE 5-1.5 MG/5ML PO SYRP: 5.0000 mL | ORAL_SOLUTION | ORAL | Status: AC | PRN

## 2011-03-13 NOTE — Progress Notes (Signed)
  Subjective:    Patient ID: Erin Carroll, female    DOB: [birth year], 65 y.o.   MRN: [id number]  HPI  Patient comes in for an acute visit with the onset of sore throat 2-3 days ago and then becoming very congested in the nasal area. She is using saline irrigation but still is very stuffy and clogged. She cannot take pseudoephedrine because it causes palpitations .  NyQuil was plus minus helpful.  No fever.       No asthma  Allergies .  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 She is a nonsmoker and has no history of asthma or chronic lung disease she states that her throat feels tight but has no wheezing or shortness of breath.   Review of Systems As per history of present illness  no significant headache nausea vomiting diarrhea skin rashes.    Objective:   Physical Exam Well-developed well-nourished in no acute distress who is moderately hoarse with an occasional cough quiet respirations without retractions good color and capillary perfusion.  HEENT: Normocephalic ;atraumatic , Eyes;  PERRL, EOMs  Full, lids and conjunctiva clear,,Ears: no deformities, canals nl, TM landmarks normal, Nose: no deformity or discharge 3+ turbinates face nontender  Mouth : OP clear without lesion or edema . Some drainage tracts noted.   Neck: Tender a.c. nodes no enlargement supple Chest:  Clear to A&P without wheezes rales or rhonchi  there is no stridor CV:  S1-S2 no gallops or murmurs peripheral perfusion is normal Skin normal perfusion.      Assessment & Plan:  Acute laryngitis tracheitis most likely viral although allergy could be playing some part. However she has not had a history of allergy.   Discussed treatment regimen and expectant management antibiotics would not be helpful local care.   Can use cough medicine if needed but does not need to get this filled.

## 2011-03-13 NOTE — Patient Instructions (Signed)
Take afrin   For 2-3 days especially at night   For nose congestion . Cough med as needed Steam and voice rest .    Expect  Voice to improve  in the next 2 days cough may last 7-10 days. Call if fever shortness of breath or wheezing.

## 2011-03-16 ENCOUNTER — Telehealth: Payer: Self-pay | Admitting: *Deleted

## 2011-03-16 NOTE — Telephone Encounter (Signed)
This is not unusual to last this long.  If still with laryngitis in one week f/u with Dr Fabian Sharp.

## 2011-03-16 NOTE — Telephone Encounter (Addendum)
Pt is still having symptoms of laryngitis, and wants to know if she should  come back.  Dr. Fabian Sharp diagnosed her  With a  viral illness.

## 2011-03-16 NOTE — Telephone Encounter (Signed)
Pt informed and she voiced her understanding 

## 2011-05-09 ENCOUNTER — Telehealth: Payer: Self-pay | Admitting: Family Medicine

## 2011-05-09 NOTE — Telephone Encounter (Signed)
Triage vm----------was on Diclofenac for arthritis. Her started falling out, so she stopped it. Having a twitch in her left eyelid for 3 months. Please advise.

## 2011-05-11 NOTE — Telephone Encounter (Signed)
Dr. Stafford called pt.  

## 2011-05-22 ENCOUNTER — Telehealth: Payer: Self-pay | Admitting: Family Medicine

## 2011-05-22 NOTE — Telephone Encounter (Signed)
Pt is still having eye lid issues  - problem is more frequent -  pt needs to know if she should see the doctor or be referred to a specialist.. Another contact# [phone]

## 2011-05-23 NOTE — Telephone Encounter (Signed)
Called left message to call in am

## 2011-05-23 NOTE — Telephone Encounter (Signed)
Left message to return call in am

## 2011-05-29 ENCOUNTER — Other Ambulatory Visit: Payer: Self-pay | Admitting: Family Medicine

## 2011-05-31 ENCOUNTER — Other Ambulatory Visit: Payer: Self-pay | Admitting: Family Medicine

## 2011-05-31 ENCOUNTER — Other Ambulatory Visit: Payer: Self-pay

## 2011-05-31 MED ORDER — CELECOXIB 200 MG PO CAPS: 200.0000 mg | ORAL_CAPSULE | Freq: Two times a day (BID) | ORAL | Status: AC

## 2011-05-31 NOTE — Telephone Encounter (Signed)
Called uable to reach pt, to call later

## 2011-05-31 NOTE — Telephone Encounter (Signed)
Ok per Dr. Scotty Court to switch from diclofenac to celebrex

## 2011-05-31 NOTE — Telephone Encounter (Signed)
Pt does mnot want to take valium nor beta blocker for twitching

## 2011-06-08 ENCOUNTER — Encounter: Payer: Self-pay | Admitting: Internal Medicine

## 2011-06-08 ENCOUNTER — Ambulatory Visit (INDEPENDENT_AMBULATORY_CARE_PROVIDER_SITE_OTHER): Payer: BC Managed Care – PPO | Admitting: Internal Medicine

## 2011-06-08 VITALS — BP 90/66 | HR 80 | Ht 62.0 in | Wt 116.0 lb

## 2011-06-08 DIAGNOSIS — R159 Full incontinence of feces: Secondary | ICD-10-CM

## 2011-06-08 DIAGNOSIS — Z86010 Personal history of colonic polyps: Secondary | ICD-10-CM

## 2011-06-08 DIAGNOSIS — K623 Rectal prolapse: Secondary | ICD-10-CM

## 2011-06-08 NOTE — Progress Notes (Signed)
HISTORY OF PRESENT ILLNESS:  Erin Carroll is a 65 y.o. female with a history of hypothyroidism, osteoarthritis, remote alcoholism, and adenomatous colon polyps. She was evaluated in April of 2011 regarding change in bowel habits, fecal incontinence, and minor rectal bleeding. Previous colonoscopies were performed in 2003 and 2006. She subsequently underwent colonoscopy April 2011. Examination was normal including a retroflexed view of the rectum. Routine followup in 5 years recommended. She now reports to me a several month history of intermittent problems with prolapsing of smooth pinkish mucosa with defecation. Occasional minor rectal bleeding. Still problems with incontinence. Previously advised to bulk stools with fibrin where protective undergarments. No abdominal pain or weight loss. Only other GI complaint is that of increased gas  REVIEW OF SYSTEMS:  All non-GI ROS negative except for arthritis  Past Medical History  Diagnosis Date  . Thyroid disease     problems with thyroid  . Alcoholism /alcohol abuse     sober since 1996  . Arthritis     Past Surgical History  Procedure Date  . Right knee arthroscopy 1987/1991  . Shoulder arthroscopy 2004  . Breast biopsy 1977    left  . Tonsillectomy     Social History Erin Carroll  reports that she quit smoking about 23 years ago. She has never used smokeless tobacco. She reports that she does not drink alcohol or use illicit drugs.  family history includes Aneurysm in her father; Arthritis in her mother; and Heart attack in her father.  No Known Allergies     PHYSICAL EXAMINATION: Vital signs: BP 90/66  Pulse 80  Ht 5\' 2"  (1.575 m)  Wt 116 lb (52.617 kg)  BMI 21.22 kg/m2  Constitutional: generally well-appearing, no acute distress Psychiatric: alert and oriented x3, cooperative Eyes: extraocular movements intact, anicteric, conjunctiva pink Mouth: oral pharynx moist, no lesions Neck: supple no  lymphadenopathy Cardiovascular: heart regular rate and rhythm, no murmur Lungs: clear to auscultation bilaterally Abdomen: soft, nontender, nondistended, no obvious ascites, no peritoneal signs, normal bowel sounds, no organomegaly Rectal: No external abnormalities no internal tenderness or mass. Suggestion of mild rectal prolapse with Valsalva. Hemoccult negative stool Extremities: no lower extremity edema bilaterally Skin: no lesions on visible extremities Neuro: No focal deficits.    ASSESSMENT:  #1. Minor rectal prolapse #2. History of incontinence as previously evaluated #3. History of adenomatous colon polyps   PLAN: #1. Fiber supplementation #2. Discussion on rectal prolapse today #3. Routine colonoscopy around 2016, April #4. Contact the office in the term for worsening problems, new problems, or questions.

## 2011-06-08 NOTE — Patient Instructions (Signed)
Follow up as needed

## 2011-07-02 ENCOUNTER — Ambulatory Visit: Payer: BC Managed Care – PPO | Admitting: Internal Medicine

## 2011-11-19 ENCOUNTER — Ambulatory Visit (INDEPENDENT_AMBULATORY_CARE_PROVIDER_SITE_OTHER): Payer: BC Managed Care – PPO | Admitting: Internal Medicine

## 2011-11-19 ENCOUNTER — Encounter: Payer: Self-pay | Admitting: Internal Medicine

## 2011-11-19 DIAGNOSIS — R7401 Elevation of levels of liver transaminase levels: Secondary | ICD-10-CM

## 2011-11-19 DIAGNOSIS — D649 Anemia, unspecified: Secondary | ICD-10-CM

## 2011-11-19 DIAGNOSIS — R748 Abnormal levels of other serum enzymes: Secondary | ICD-10-CM

## 2011-11-19 DIAGNOSIS — E039 Hypothyroidism, unspecified: Secondary | ICD-10-CM

## 2011-11-19 DIAGNOSIS — Z79899 Other long term (current) drug therapy: Secondary | ICD-10-CM

## 2011-11-20 LAB — COMPREHENSIVE METABOLIC PANEL
ALT: 24 U/L (ref 0–35)
AST: 33 U/L (ref 0–37)
Albumin: 4.2 g/dL (ref 3.5–5.2)
Alkaline Phosphatase: 62 U/L (ref 39–117)
Glucose, Bld: 82 mg/dL (ref 70–99)
Potassium: 4.9 mEq/L (ref 3.5–5.3)
Sodium: 140 mEq/L (ref 135–145)
Total Protein: 6.2 g/dL (ref 6.0–8.3)

## 2011-11-20 LAB — CBC
MCV: 96.6 fL (ref 78.0–100.0)
Platelets: 262 10*3/uL (ref 150–400)
RBC: 3.84 MIL/uL — ABNORMAL LOW (ref 3.87–5.11)
WBC: 7.4 10*3/uL (ref 4.0–10.5)

## 2011-11-20 LAB — FERRITIN: Ferritin: 24 ng/mL (ref 10–291)

## 2011-11-20 LAB — T4, FREE: Free T4: 0.99 ng/dL (ref 0.80–1.80)

## 2011-11-25 DIAGNOSIS — D649 Anemia, unspecified: Secondary | ICD-10-CM | POA: Insufficient documentation

## 2011-11-25 DIAGNOSIS — R748 Abnormal levels of other serum enzymes: Secondary | ICD-10-CM | POA: Insufficient documentation

## 2011-11-25 NOTE — Assessment & Plan Note (Signed)
Stable and asx. Obtain tsh and free t4.

## 2011-11-25 NOTE — Assessment & Plan Note (Signed)
Asx. Obtain cbc, b12, iron levels

## 2011-11-25 NOTE — Progress Notes (Signed)
  Subjective:    Patient ID: Erin Carroll, female    DOB: 1946-04-21, 65 y.o.   MRN: [id number]  HPI Pt presents to clinic to establish care and to follow up multiple medical problems.h/o hypothyroidism without sx's of hypo or hyperthyroidism. Last labs reviewed with pt. Minimally elevated AST. +mild anemia without gross active bleeding. Pap smear, mammogram and colonoscopy utd. No active complaints  Past Medical History  Diagnosis Date  . Thyroid disease     problems with thyroid  . Alcoholism /alcohol abuse     sober since 1996  . Arthritis    Past Surgical History  Procedure Date  . Right knee arthroscopy 1987/1991  . Shoulder arthroscopy 2004  . Breast biopsy 1977    left  . Tonsillectomy     reports that she quit smoking about 23 years ago. She has never used smokeless tobacco. She reports that she does not drink alcohol or use illicit drugs. family history includes Aneurysm in her father; Arthritis in her mother; and Heart attack in her father. No Known Allergies   Review of Systems  Constitutional: Negative for unexpected weight change.  Gastrointestinal: Negative for abdominal pain and blood in stool.  All other systems reviewed and are negative.       Objective:   Physical Exam  Physical Exam  Nursing note and vitals reviewed. Constitutional: Appears well-developed and well-nourished. No distress.  HENT:  Head: Normocephalic and atraumatic.  Right Ear: External ear normal.  Left Ear: External ear normal.  Eyes: Conjunctivae are normal. No scleral icterus.  Neck: Neck supple. Carotid bruit is not present.  Cardiovascular: Normal rate, regular rhythm and normal heart sounds.  Exam reveals no gallop and no friction rub.   No murmur heard. Pulmonary/Chest: Effort normal and breath sounds normal. No respiratory distress. He has no wheezes. no rales.  Lymphadenopathy:    He has no cervical adenopathy.  Neurological:Alert.  Skin: Skin is warm and dry. Not  diaphoretic.  Psychiatric: Has a normal mood and affect.        Assessment & Plan:

## 2011-11-25 NOTE — Assessment & Plan Note (Signed)
Minimally elevated. Asx. Repeat lft

## 2011-12-04 HISTORY — PX: METACARPOPHALANGEAL JOINT ARTHROPLASTY: SUR72

## 2011-12-24 ENCOUNTER — Other Ambulatory Visit: Payer: Self-pay | Admitting: Family Medicine

## 2011-12-25 NOTE — Telephone Encounter (Signed)
ok 

## 2011-12-25 NOTE — Telephone Encounter (Signed)
Rx refill sent to pharmacy. 

## 2012-02-11 ENCOUNTER — Other Ambulatory Visit: Payer: Self-pay | Admitting: Family Medicine

## 2012-02-13 NOTE — Telephone Encounter (Signed)
Rx refill sent to pharmacy. 

## 2012-05-19 ENCOUNTER — Ambulatory Visit (INDEPENDENT_AMBULATORY_CARE_PROVIDER_SITE_OTHER): Payer: BC Managed Care – PPO | Admitting: Internal Medicine

## 2012-05-19 ENCOUNTER — Telehealth: Payer: Self-pay | Admitting: Internal Medicine

## 2012-05-19 ENCOUNTER — Ambulatory Visit (HOSPITAL_BASED_OUTPATIENT_CLINIC_OR_DEPARTMENT_OTHER)
Admission: RE | Admit: 2012-05-19 | Discharge: 2012-05-19 | Disposition: A | Discharge status: Home / Self Care | Payer: BC Managed Care – PPO | Source: Ambulatory Visit | Attending: Internal Medicine | Admitting: Internal Medicine

## 2012-05-19 ENCOUNTER — Encounter: Payer: Self-pay | Admitting: Internal Medicine

## 2012-05-19 VITALS — BP 104/70 | HR 69 | Temp 98.2°F | Resp 16 | Ht 62.25 in | Wt 120.0 lb

## 2012-05-19 DIAGNOSIS — Q762 Congenital spondylolisthesis: Secondary | ICD-10-CM | POA: Insufficient documentation

## 2012-05-19 DIAGNOSIS — E039 Hypothyroidism, unspecified: Secondary | ICD-10-CM

## 2012-05-19 DIAGNOSIS — R209 Unspecified disturbances of skin sensation: Secondary | ICD-10-CM | POA: Insufficient documentation

## 2012-05-19 DIAGNOSIS — R200 Anesthesia of skin: Secondary | ICD-10-CM

## 2012-05-19 DIAGNOSIS — M5030 Other cervical disc degeneration, unspecified cervical region: Secondary | ICD-10-CM | POA: Insufficient documentation

## 2012-05-19 DIAGNOSIS — Z0000 Encounter for general adult medical examination without abnormal findings: Secondary | ICD-10-CM

## 2012-05-19 LAB — T4, FREE: Free T4: 1.17 ng/dL (ref 0.80–1.80)

## 2012-05-19 LAB — TSH: TSH: 0.898 u[IU]/mL (ref 0.350–4.500)

## 2012-05-19 MED ORDER — MELOXICAM 15 MG PO TABS: 15.0000 mg | ORAL_TABLET | Freq: Every day | ORAL | Status: DC

## 2012-05-19 MED ORDER — LEVOTHYROXINE SODIUM 75 MCG PO TABS: 75.0000 ug | ORAL_TABLET | Freq: Every day | ORAL | Status: DC

## 2012-05-19 NOTE — Patient Instructions (Signed)
Please schedule fasting labs prior to next visit Cbc, chem7, lipid, lft, tsh, free t4, ua with reflex micro-v70.0

## 2012-05-19 NOTE — Telephone Encounter (Signed)
Lab orders entered for December 2013. 

## 2012-05-19 NOTE — Assessment & Plan Note (Signed)
Obtain right shoulder and cspine xray.

## 2012-05-19 NOTE — Assessment & Plan Note (Addendum)
Obtain tsh/free t4 

## 2012-05-19 NOTE — Progress Notes (Signed)
  Subjective:    Patient ID: Erin Carroll, female    DOB: [birth year], 66 y.o.   MRN: [id number]  HPI Pt presents to clinic for followup of multiple medical problems. Notes intermittent episodes of right arm numbness from shoulder/upper arm to mid arm. No trigger. Often relieved by position change. No muscle weakness. Occurs daily. No other alleviating or exacerbating factors.  Past Medical History  Diagnosis Date  . Thyroid disease     problems with thyroid  . Alcoholism /alcohol abuse     sober since 1996  . Arthritis    Past Surgical History  Procedure Date  . Right knee arthroscopy 1987/1991  . Shoulder arthroscopy 2004  . Breast biopsy 1977    left  . Tonsillectomy     reports that she quit smoking about 24 years ago. She has never used smokeless tobacco. She reports that she does not drink alcohol or use illicit drugs. family history includes Aneurysm in her father; Arthritis in her mother; and Heart attack in her father. No Known Allergies    Review of Systems see hpi     Objective:   Physical Exam  Physical Exam  Nursing note and vitals reviewed. Constitutional: Appears well-developed and well-nourished. No distress.  HENT:  Head: Normocephalic and atraumatic.  Right Ear: External ear normal.  Left Ear: External ear normal.  Eyes: Conjunctivae are normal. No scleral icterus.  Neck: Neck supple. Carotid bruit is not present.  Cardiovascular: Normal rate, regular rhythm and normal heart sounds.  Exam reveals no gallop and no friction rub.   No murmur heard. Pulmonary/Chest: Effort normal and breath sounds normal. No respiratory distress. He has no wheezes. no rales.  Lymphadenopathy:    He has no cervical adenopathy.  Neurological:Alert.  Skin: Skin is warm and dry. Not diaphoretic.  Psychiatric: Has a normal mood and affect.  WUJ:WJXBJ shoulder:mild tenderness AC and glenoid. FROM. Post neck no midline cervical tenderness. Right hand 5/5 strength       Assessment & Plan:

## 2012-07-01 ENCOUNTER — Encounter: Payer: Self-pay | Admitting: Family

## 2012-07-01 ENCOUNTER — Ambulatory Visit (HOSPITAL_BASED_OUTPATIENT_CLINIC_OR_DEPARTMENT_OTHER)
Admission: RE | Admit: 2012-07-01 | Discharge: 2012-07-01 | Disposition: A | Discharge status: Home / Self Care | Payer: BC Managed Care – PPO | Source: Ambulatory Visit | Attending: Family | Admitting: Family

## 2012-07-01 ENCOUNTER — Telehealth: Payer: Self-pay | Admitting: Family

## 2012-07-01 ENCOUNTER — Other Ambulatory Visit: Payer: Self-pay | Admitting: Family

## 2012-07-01 ENCOUNTER — Ambulatory Visit (INDEPENDENT_AMBULATORY_CARE_PROVIDER_SITE_OTHER): Payer: BC Managed Care – PPO | Admitting: Family

## 2012-07-01 VITALS — BP 100/54 | HR 81 | Temp 98.7°F | Resp 16 | Ht 62.25 in | Wt 121.0 lb

## 2012-07-01 DIAGNOSIS — R079 Chest pain, unspecified: Secondary | ICD-10-CM | POA: Insufficient documentation

## 2012-07-01 DIAGNOSIS — R0781 Pleurodynia: Secondary | ICD-10-CM | POA: Insufficient documentation

## 2012-07-01 DIAGNOSIS — X58XXXA Exposure to other specified factors, initial encounter: Secondary | ICD-10-CM | POA: Insufficient documentation

## 2012-07-01 DIAGNOSIS — S2239XA Fracture of one rib, unspecified side, initial encounter for closed fracture: Secondary | ICD-10-CM | POA: Insufficient documentation

## 2012-07-01 MED ORDER — TRAMADOL HCL 50 MG PO TABS: 50.0000 mg | ORAL_TABLET | Freq: Three times a day (TID) | ORAL | Status: AC | PRN

## 2012-07-01 NOTE — Progress Notes (Signed)
  Subjective:    Patient ID: Erin Carroll, female    DOB: [birth year], 66 y.o.   MRN: [id number]  HPI  Erin Carroll is a 66 yr old female who presents today with chief complaint of left rib pain.  Reports that pain started after reaching over van seat 1 week ago. Reports that area is still tender to touch and getting worse. Meloxicam not helping. Pain is worse with twisting/bending movements.  She denies associated shortness of breath. Review of Systems    see HPI  Past Medical History  Diagnosis Date  . Thyroid disease     problems with thyroid  . Alcoholism /alcohol abuse     sober since 1996  . Arthritis     History   Social History  . Marital Status: Married    Spouse Name: N/A    Number of Children: 0  . Years of Education: N/A   Occupational History  . pharmacist    Social History Main Topics  . Smoking status: Former Smoker    Quit date: 12/04/1987  . Smokeless tobacco: Never Used  . Alcohol Use: No  . Drug Use: No  . Sexually Active: Yes   Other Topics Concern  . 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 Right knee arthroscopy 1987/1991  . Shoulder arthroscopy 2004  . Breast biopsy 1977    left  . Tonsillectomy     Family History  Problem Relation Age of Onset  . Arthritis Mother   . Heart attack Father   . Aneurysm Father     intercranial    No Known Allergies  Current Outpatient Prescriptions on File Prior to Visit  Medication Sig Dispense Refill  . aspirin 81 MG tablet Take 81 mg by mouth daily.        Marland Kitchen estradiol-norethindrone (COMBIPATCH) 0.05-0.14 MG/DAY Place 1 patch onto the skin once a week.      . levothyroxine (SYNTHROID) 75 MCG tablet Take 1 tablet (75 mcg total) by mouth daily.  90 tablet  1  . meloxicam (MOBIC) 15 MG tablet Take 1 tablet (15 mg total) by mouth daily.  90 tablet  1  . Multiple Vitamin (MULTIVITAMIN)  tablet Take 1 tablet by mouth daily.          BP 100/54  Pulse 81  Temp 98.7 F (37.1 C) (Oral)  Resp 16  Ht 5' 2.25" (1.581 m)  Wt 121 lb (54.885 kg)  BMI 21.95 kg/m2  SpO2 99%    Objective:   Physical Exam  Constitutional: She appears well-developed and well-nourished. No distress.  Cardiovascular: Normal rate and regular rhythm.   No murmur heard. Pulmonary/Chest: Effort normal and breath sounds normal. No respiratory distress. She has no wheezes. She has no rales. She exhibits no tenderness.  Musculoskeletal:       Left lateral Rib cage tender to palpation.  No erythema, no ecchymosis.  Psychiatric: She has a normal mood and affect. Her speech is normal and behavior is normal.          Assessment & Plan:

## 2012-07-01 NOTE — Patient Instructions (Addendum)
Please complete x-ray on the first floor.  

## 2012-07-01 NOTE — Telephone Encounter (Signed)
Reviewed X-ray- left ninth rib fracture- mildly displaced.  Recommended tramadol prn pain, pt to avoid heavy lifting, twisting etc.  Call if SOB, worsening pain, or if pain does not improve over the next several weeks.  Pt verbalizes understanding.

## 2012-07-01 NOTE — Assessment & Plan Note (Signed)
66 yr old female with hx of osteopenia and left rib pain, obtain rib film to exclude rib fracture.

## 2012-07-21 ENCOUNTER — Other Ambulatory Visit: Payer: Self-pay | Admitting: Internal Medicine

## 2012-07-21 NOTE — Telephone Encounter (Signed)
DENIED--too early for refill request [last Rx 06.17.13 #90x1]/SLS

## 2012-11-12 LAB — CBC
HCT: 35 % — ABNORMAL LOW (ref 36.0–46.0)
Hemoglobin: 12 g/dL (ref 12.0–15.0)
MCV: 91.9 fL (ref 78.0–100.0)
RDW: 14.5 % (ref 11.5–15.5)
WBC: 5.3 10*3/uL (ref 4.0–10.5)

## 2012-11-12 NOTE — Telephone Encounter (Signed)
Pt presented to the lab, future orders released. 

## 2012-11-12 NOTE — Addendum Note (Signed)
Addended by: Mervin Kung A on: 11/12/2012 09:01 AM   Modules accepted: Orders

## 2012-11-13 LAB — HEPATIC FUNCTION PANEL
Alkaline Phosphatase: 51 U/L (ref 39–117)
Bilirubin, Direct: 0.1 mg/dL (ref 0.0–0.3)
Indirect Bilirubin: 0.3 mg/dL (ref 0.0–0.9)
Total Bilirubin: 0.4 mg/dL (ref 0.3–1.2)
Total Protein: 5.4 g/dL — ABNORMAL LOW (ref 6.0–8.3)

## 2012-11-13 LAB — URINALYSIS, ROUTINE W REFLEX MICROSCOPIC
Glucose, UA: NEGATIVE mg/dL
Leukocytes, UA: NEGATIVE
Nitrite: NEGATIVE
Specific Gravity, Urine: 1.015 (ref 1.005–1.030)
pH: 6 (ref 5.0–8.0)

## 2012-11-13 LAB — BASIC METABOLIC PANEL
BUN: 16 mg/dL (ref 6–23)
Chloride: 107 mEq/L (ref 96–112)
Creat: 0.75 mg/dL (ref 0.50–1.10)
Glucose, Bld: 74 mg/dL (ref 70–99)

## 2012-11-13 LAB — LIPID PANEL
LDL Cholesterol: 115 mg/dL — ABNORMAL HIGH (ref 0–99)
Triglycerides: 43 mg/dL (ref <150)

## 2012-11-13 LAB — T4, FREE: Free T4: 0.97 ng/dL (ref 0.80–1.80)

## 2012-11-14 ENCOUNTER — Other Ambulatory Visit: Payer: Self-pay | Admitting: *Deleted

## 2012-11-14 ENCOUNTER — Encounter: Payer: Self-pay | Admitting: Internal Medicine

## 2012-11-14 ENCOUNTER — Ambulatory Visit (INDEPENDENT_AMBULATORY_CARE_PROVIDER_SITE_OTHER): Payer: BC Managed Care – PPO | Admitting: Internal Medicine

## 2012-11-14 VITALS — BP 112/74 | HR 66 | Temp 97.5°F | Resp 14 | Ht 62.25 in | Wt 118.0 lb

## 2012-11-14 DIAGNOSIS — Z0000 Encounter for general adult medical examination without abnormal findings: Secondary | ICD-10-CM | POA: Insufficient documentation

## 2012-11-14 MED ORDER — LEVOTHYROXINE SODIUM 75 MCG PO TABS: 75.0000 ug | ORAL_TABLET | Freq: Every day | ORAL | Status: DC

## 2012-11-14 MED ORDER — FLUCONAZOLE 150 MG PO TABS: 150.0000 mg | ORAL_TABLET | Freq: Once | ORAL | Status: DC

## 2012-11-14 NOTE — Progress Notes (Signed)
  Subjective:    Patient ID: Erin Carroll, female    DOB: 1946-06-15, 66 y.o.   MRN: [id number]  HPI Pt presents to clinic for annual physical. Sees gyn and utd with pap smear and mammogram. States recently took abx and now has yeast infxn. No other complaints.  Past Medical History  Diagnosis Date  . Thyroid disease     problems with thyroid  . Alcoholism /alcohol abuse     sober since 1996  . Arthritis    Past Surgical History  Procedure Date  . Right knee arthroscopy 1987/1991  . Shoulder arthroscopy 2004  . Breast biopsy 1977    left  . Tonsillectomy     reports that she quit smoking about 24 years ago. She has never used smokeless tobacco. She reports that she does not drink alcohol or use illicit drugs. family history includes Aneurysm in her father; Arthritis in her mother; and Heart attack in her father. No Known Allergies    Review of Systems see hpi    Objective:   Physical Exam  Nursing note and vitals reviewed. Constitutional: She appears well-developed and well-nourished. No distress.  HENT:  Head: Normocephalic and atraumatic.  Right Ear: Tympanic membrane, external ear and ear canal normal.  Left Ear: Tympanic membrane, external ear and ear canal normal.  Nose: Nose normal.  Mouth/Throat: Oropharynx is clear and moist. No oropharyngeal exudate.  Eyes: Conjunctivae normal and EOM are normal. Pupils are equal, round, and reactive to light. No scleral icterus.  Neck: Neck supple. Carotid bruit is not present. No thyromegaly present.  Cardiovascular: Normal rate, regular rhythm, normal heart sounds and intact distal pulses.  Exam reveals no gallop and no friction rub.   No murmur heard. Pulmonary/Chest: Effort normal and breath sounds normal. No respiratory distress. She has no wheezes. She has no rales.  Abdominal: Soft. Normal appearance and bowel sounds are normal. She exhibits no distension and no mass. There is no hepatosplenomegaly. There is no  tenderness. There is no rebound and no guarding.  Lymphadenopathy:    She has no cervical adenopathy.  Neurological: She is alert.  Skin: Skin is warm and dry. She is not diaphoretic.  Psychiatric: She has a normal mood and affect.          Assessment & Plan:

## 2012-11-14 NOTE — Assessment & Plan Note (Signed)
Nl exam. Pap smear/mammogram per gyn. Reviewed labs and provided copy. Provide with diflucan x one. Received outside influenza vaccine this season. EKG demonstrates SB 58 with left axis and nl intervals

## 2012-11-14 NOTE — Telephone Encounter (Signed)
Pt requested refill of levothyroxine be sent to [facility] [address]. Rx sent.

## 2013-05-06 ENCOUNTER — Ambulatory Visit (INDEPENDENT_AMBULATORY_CARE_PROVIDER_SITE_OTHER): Payer: Medicare Other | Admitting: Family

## 2013-05-06 ENCOUNTER — Encounter: Payer: Self-pay | Admitting: Family

## 2013-05-06 VITALS — BP 100/62 | HR 67 | Temp 98.3°F | Resp 16 | Ht 62.25 in | Wt 114.1 lb

## 2013-05-06 DIAGNOSIS — E039 Hypothyroidism, unspecified: Secondary | ICD-10-CM

## 2013-05-06 LAB — TSH: TSH: 0.458 u[IU]/mL (ref 0.350–4.500)

## 2013-05-06 NOTE — Patient Instructions (Addendum)
Please complete your lab work prior to leaving. Schedule medicare wellness at your convenience.

## 2013-05-06 NOTE — Assessment & Plan Note (Signed)
Clinically stable on synthroid, continue same. Obtain follow up TSH.  

## 2013-05-06 NOTE — Progress Notes (Signed)
  Subjective:    Patient ID: Erin Carroll, female    DOB: 30-Oct-1946, 67 y.o.   MRN: [id number]  HPI  Erin Carroll is a 67 yr old female who presents today for follow up.  1) Hypothyroidism- she is currently maintained on synthroid 75 mcg.   2) Trigger finger- reports that she had a trigger finger release left hand by Dr. Amanda Pea recently.  Review of Systems    see HPI Past Medical History  Diagnosis Date  . Thyroid disease     problems with thyroid  . Alcoholism /alcohol abuse     sober since 1996  . Arthritis     History   Social History  . Marital Status: Married    Spouse Name: N/A    Number of Children: 0  . Years of Education: N/A   Occupational History  . pharmacist    Social History Main Topics  . Smoking status: Former Smoker    Quit date: 12/04/1987  . Smokeless tobacco: Never Used  . Alcohol Use: No  . Drug Use: No  . Sexually Active: Yes   Other Topics Concern  . Not on file   Social History Narrative   Works as a Teacher, early years/pre    is married ex-smoker    does do regular exercise    Past Surgical History  Procedure Laterality Date  . Right knee arthroscopy  1987/1991  . Shoulder arthroscopy  2004  . Breast biopsy  1977    left  . Tonsillectomy      Family History  Problem Relation Age of Onset  . Arthritis Mother   . Heart attack Father   . Aneurysm Father     intercranial    No Known Allergies  Current Outpatient Prescriptions on File Prior to Visit  Medication Sig Dispense Refill  . aspirin 81 MG tablet Take 81 mg by mouth daily.        Marland Kitchen estradiol-norethindrone (COMBIPATCH) 0.05-0.14 MG/DAY Place 1 patch onto the skin once a week.      . levothyroxine (SYNTHROID) 75 MCG tablet Take 1 tablet (75 mcg total) by mouth daily.  90 tablet  1  . Multiple Vitamin (MULTIVITAMIN) tablet Take 1 tablet by mouth daily.         No current facility-administered medications on file prior to visit.    BP 100/62  Pulse 67  Temp(Src) 98.3 F  (36.8 C) (Oral)  Resp 16  Ht 5' 2.25" (1.581 m)  Wt 114 lb 1.9 oz (51.764 kg)  BMI 20.71 kg/m2  SpO2 97%    Objective:   Physical Exam  Constitutional: She appears well-developed and well-nourished. No distress.  Neck: No thyromegaly present.  Cardiovascular: Normal rate and regular rhythm.   No murmur heard. Pulmonary/Chest: Effort normal and breath sounds normal. No respiratory distress. She has no wheezes. She has no rales. She exhibits no tenderness.  Psychiatric: She has a normal mood and affect. Her behavior is normal. Judgment and thought content normal.          Assessment & Plan:

## 2013-05-07 ENCOUNTER — Telehealth: Payer: Self-pay | Admitting: Family

## 2013-05-07 MED ORDER — LEVOTHYROXINE SODIUM 125 MCG PO TABS: ORAL_TABLET | ORAL | Status: DC

## 2013-05-07 NOTE — Telephone Encounter (Signed)
Based on lab work I would like to decrease her synthroid slightly. I would like to change her from 75 mcg to 125 mcg (1/2 tab) once daily.  Follow  Up in 6 weeks for TSH (dx hypothyroid).

## 2013-05-07 NOTE — Telephone Encounter (Signed)
Left message on home # to return my call. 

## 2013-05-08 MED ORDER — LEVOTHYROXINE SODIUM 75 MCG PO TABS: 75.0000 ug | ORAL_TABLET | Freq: Every day | ORAL | Status: DC

## 2013-05-08 NOTE — Telephone Encounter (Signed)
Notified pt and she states that she is hesitant to change her dosage at present as she feels well. Not having any "hyperactive" symptoms at present. Pt wants to know if she can stay on current dose ( )?

## 2013-05-08 NOTE — Telephone Encounter (Signed)
FYI I  Did not send 90 day supply as we may need to change her dose.

## 2013-05-08 NOTE — Telephone Encounter (Signed)
Also, I had advised pt she is at the lower normal range and she would still like to continue her current dose. Please advise.

## 2013-05-08 NOTE — Telephone Encounter (Signed)
OK to continue , repeat TSH in 3 months please. I cancelled dose at pharmacy and sent 75 mcg refill.

## 2013-05-12 NOTE — Telephone Encounter (Signed)
Discuss with patient who indicated that she was not reluctant to change med but was a little upset at the fact that no one call to inform her of the change she just received call from pharmacy stating that a Rx was ready. Pt indicated that she now has 2 different doses of med because we failed to inform her of the med change. Pt will remain on current dose of 75 mcg and have labs done in 3 month. Pt advise if she decides to change to the 125 mcg since she has them she will needs labs in 6 weeks Pt ok but will stay on present dose but wanted to let Melissa know what the real issue was with the med.

## 2013-05-12 NOTE — Telephone Encounter (Signed)
Spoke to pt. Addressed her concerns. Questions answered. She will continue the .

## 2013-05-12 NOTE — Telephone Encounter (Signed)
Left message to call office to advise Pt to continue with the 75 mcg and repeat TSH in 3 months.

## 2013-07-03 LAB — HM PAP SMEAR: HM Pap smear: NORMAL

## 2013-08-19 ENCOUNTER — Telehealth: Payer: Self-pay | Admitting: *Deleted

## 2013-08-19 DIAGNOSIS — E039 Hypothyroidism, unspecified: Secondary | ICD-10-CM

## 2013-08-19 NOTE — Telephone Encounter (Signed)
Pt presented to the lab. ORder entered for TSH per 05/07/13 phone note.

## 2013-08-20 LAB — TSH: TSH: 0.524 u[IU]/mL (ref 0.350–4.500)

## 2013-08-24 ENCOUNTER — Other Ambulatory Visit: Payer: Self-pay | Admitting: Family

## 2013-10-28 ENCOUNTER — Encounter: Payer: Self-pay | Admitting: Internal Medicine

## 2013-10-28 ENCOUNTER — Ambulatory Visit (INDEPENDENT_AMBULATORY_CARE_PROVIDER_SITE_OTHER): Payer: Medicare Other | Admitting: Internal Medicine

## 2013-10-28 VITALS — BP 106/70 | HR 68 | Ht 62.25 in | Wt 118.0 lb

## 2013-10-28 DIAGNOSIS — K623 Rectal prolapse: Secondary | ICD-10-CM

## 2013-10-28 DIAGNOSIS — R141 Gas pain: Secondary | ICD-10-CM

## 2013-10-28 DIAGNOSIS — Z86010 Personal history of colonic polyps: Secondary | ICD-10-CM

## 2013-10-28 MED ORDER — RESTORA PO CAPS: 1.0000 | ORAL_CAPSULE | Freq: Every day | ORAL | Status: DC

## 2013-10-28 NOTE — Progress Notes (Signed)
HISTORY OF PRESENT ILLNESS:  Erin Carroll is a 67 y.o. female , retired Teacher, early years/pre, who presents today regarding progressive problems with rectal prolapse and increased intestinal gas manifested by flatus. The patient has a history of adenomatous colon polyps and has undergone prior colonoscopy in 2003, 2006, and most recently April of 2011. Her most recent colonoscopy was entirely normal. She was last evaluated in the office July 2012 with minor rectal prolapse associated with occasional incontinence and minor bleeding. She was prescribed fiber supplementation and protective undergarments. She presents today noticing prolapse with each bowel movement. States it is becoming progressively difficult to retract prolapsed tissue. Still with occasional incontinence, leakage, and difficulty cleaning the perirectal area. No bleeding. She does have daily bowel movements. No associated pain. Next, she reports worsening problems with intestinal gas over the past 6 months. More flatus. No other associated complaints. She does use yogurt  REVIEW OF SYSTEMS:  All non-GI ROS negative except for arthritis  Past Medical History  Diagnosis Date  . Thyroid disease     problems with thyroid  . Alcoholism /alcohol abuse     sober since 1996  . Arthritis     Past Surgical History  Procedure Laterality Date  . Right knee arthroscopy  1987/1991  . Shoulder arthroscopy  2004  . Breast biopsy  1977    left  . Tonsillectomy    . Metacarpophalangeal joint arthroplasty      Social History JOLEENE BURNHAM  reports that she quit smoking about 25 years ago. She has never used smokeless tobacco. She reports that she does not drink alcohol or use illicit drugs.  family history includes Aneurysm in her father; Arthritis in her mother; Heart attack in her father.  No Known Allergies     PHYSICAL EXAMINATION: Vital signs: BP 106/70  Pulse 68  Ht 5' 2.25" (1.581 m)  Wt 118 lb (53.524 kg)  BMI 21.41 kg/m2   Constitutional: generally well-appearing, no acute distress Psychiatric: alert and oriented x3, cooperative Eyes: extraocular movements intact, anicteric, conjunctiva pink Mouth: oral pharynx moist, no lesions Neck: supple no lymphadenopathy Cardiovascular: heart regular rate and rhythm, no murmur Lungs: clear to auscultation bilaterally Abdomen: soft, nontender, nondistended, no obvious ascites, no peritoneal signs, normal bowel sounds, no organomegaly Rectal: No external abnormalities. Able to prolapse the rectum slightly with Valsalva maneuver Extremities: no lower extremity edema bilaterally Skin: no lesions on visible extremities Neuro: Hearing aid. No focal deficits. No asterixis.    ASSESSMENT:  #1. Progressive rectal prolapse by history #2. Increased intestinal gas #3. History of adenomatous colon polyps. Last colonoscopy April 2011 was normal   PLAN:  #1. Discussion on intestinal gas #2. Given 3 weeks of probiotic samples to see if this helps #3. Literature on intestinal gas as well as anti-gas and flatulence dietary sheet provided for her review #4. Refer to [facility] motility disorder section for formal evaluation of her rectal prolapse and accompanying treatment recommendations #5. Surveillance colonoscopy around April 2016. Interval followup as needed

## 2013-10-28 NOTE — Patient Instructions (Signed)
You have been given some samples of Restora.  Take one a day until they are gone.  Erin Carroll will call you about setting up an appointment in [facility]

## 2013-11-02 LAB — HM MAMMOGRAPHY: HM Mammogram: NORMAL

## 2013-11-13 ENCOUNTER — Telehealth: Payer: Self-pay

## 2013-11-13 NOTE — Telephone Encounter (Signed)
Pt scheduled to see Dr. Villa Herb at [facility] 02/22/14@1 :20pm.

## 2013-11-16 ENCOUNTER — Other Ambulatory Visit: Payer: Self-pay | Admitting: Family

## 2013-11-17 NOTE — Telephone Encounter (Signed)
Pt was last seen in June and advised a fasting medicare wellness exam.  Please call pt to arrange.

## 2013-11-23 NOTE — Telephone Encounter (Signed)
Left message for patient to return my call.

## 2013-12-02 NOTE — Telephone Encounter (Signed)
Left message for patient to return my call.

## 2013-12-07 NOTE — Telephone Encounter (Signed)
Informed patient of medication refill and she scheduled appointment for 12/29/13

## 2013-12-18 ENCOUNTER — Other Ambulatory Visit: Payer: Self-pay | Admitting: Orthopedic Surgery

## 2013-12-29 ENCOUNTER — Encounter (HOSPITAL_COMMUNITY): Payer: Self-pay | Admitting: Pharmacy Technician

## 2013-12-29 ENCOUNTER — Encounter: Payer: Self-pay | Admitting: Family

## 2013-12-29 ENCOUNTER — Ambulatory Visit (INDEPENDENT_AMBULATORY_CARE_PROVIDER_SITE_OTHER): Payer: Medicare Other | Admitting: Family

## 2013-12-29 VITALS — BP 110/78 | HR 64 | Temp 98.1°F | Resp 16 | Ht 62.75 in | Wt 118.0 lb

## 2013-12-29 DIAGNOSIS — I998 Other disorder of circulatory system: Secondary | ICD-10-CM

## 2013-12-29 DIAGNOSIS — Z23 Encounter for immunization: Secondary | ICD-10-CM

## 2013-12-29 DIAGNOSIS — Z0000 Encounter for general adult medical examination without abnormal findings: Secondary | ICD-10-CM

## 2013-12-29 DIAGNOSIS — M8580 Other specified disorders of bone density and structure, unspecified site: Secondary | ICD-10-CM

## 2013-12-29 DIAGNOSIS — M899 Disorder of bone, unspecified: Secondary | ICD-10-CM

## 2013-12-29 DIAGNOSIS — E785 Hyperlipidemia, unspecified: Secondary | ICD-10-CM

## 2013-12-29 DIAGNOSIS — E039 Hypothyroidism, unspecified: Secondary | ICD-10-CM

## 2013-12-29 DIAGNOSIS — M949 Disorder of cartilage, unspecified: Secondary | ICD-10-CM

## 2013-12-29 DIAGNOSIS — R58 Hemorrhage, not elsewhere classified: Secondary | ICD-10-CM | POA: Insufficient documentation

## 2013-12-29 LAB — CBC WITH DIFFERENTIAL/PLATELET
BASOS ABS: 0.1 10*3/uL (ref 0.0–0.1)
Basophils Relative: 1 % (ref 0–1)
Eosinophils Absolute: 0.2 10*3/uL (ref 0.0–0.7)
Eosinophils Relative: 4 % (ref 0–5)
HEMATOCRIT: 36.3 % (ref 36.0–46.0)
HEMOGLOBIN: 11.9 g/dL — AB (ref 12.0–15.0)
LYMPHS ABS: 1.3 10*3/uL (ref 0.7–4.0)
LYMPHS PCT: 27 % (ref 12–46)
MCH: 30.9 pg (ref 26.0–34.0)
MCHC: 32.8 g/dL (ref 30.0–36.0)
MCV: 94.3 fL (ref 78.0–100.0)
MONO ABS: 0.5 10*3/uL (ref 0.1–1.0)
Monocytes Relative: 10 % (ref 3–12)
NEUTROS ABS: 2.8 10*3/uL (ref 1.7–7.7)
Neutrophils Relative %: 58 % (ref 43–77)
Platelets: 270 10*3/uL (ref 150–400)
RBC: 3.85 MIL/uL — ABNORMAL LOW (ref 3.87–5.11)
RDW: 13.9 % (ref 11.5–15.5)
WBC: 4.8 10*3/uL (ref 4.0–10.5)

## 2013-12-29 LAB — HEPATIC FUNCTION PANEL
ALT: 20 U/L (ref 0–35)
AST: 28 U/L (ref 0–37)
Albumin: 3.9 g/dL (ref 3.5–5.2)
Alkaline Phosphatase: 54 U/L (ref 39–117)
BILIRUBIN TOTAL: 0.5 mg/dL (ref 0.3–1.2)
Bilirubin, Direct: 0.1 mg/dL (ref 0.0–0.3)
Indirect Bilirubin: 0.4 mg/dL (ref 0.0–0.9)
Total Protein: 5.6 g/dL — ABNORMAL LOW (ref 6.0–8.3)

## 2013-12-29 LAB — LIPID PANEL
CHOL/HDL RATIO: 2.3 ratio
Cholesterol: 226 mg/dL — ABNORMAL HIGH (ref 0–200)
HDL: 100 mg/dL (ref >39)
LDL CALC: 116 mg/dL — AB (ref 0–99)
Triglycerides: 51 mg/dL (ref <150)
VLDL: 10 mg/dL (ref 0–40)

## 2013-12-29 LAB — TSH: TSH: 0.392 u[IU]/mL (ref 0.350–4.500)

## 2013-12-29 NOTE — Assessment & Plan Note (Signed)
Stable on synthroid, obtain TSH.  

## 2013-12-29 NOTE — Progress Notes (Signed)
Pre visit review using our clinic review tool, if applicable. No additional management support is needed unless otherwise documented below in the visit note. 

## 2013-12-29 NOTE — Assessment & Plan Note (Signed)
Add caltrate. Advised pt to schedule follow up osteopenia

## 2013-12-29 NOTE — Patient Instructions (Signed)
Please complete lab work prior to leaving. Follow up in 6 months sooner if problems/concerns. Please schedule bone density test. Add Caltrate 600mg  + D twice daily for bone health in addition to your regular weight bearing exercise.

## 2013-12-29 NOTE — Assessment & Plan Note (Signed)
She is concerned re: bruising on her arms. Will check CBC. She did have some mild anemia in the past.

## 2013-12-29 NOTE — Progress Notes (Signed)
Subjective:    Patient ID: Erin Carroll, female    DOB: 1946-09-16, 68 y.o.   MRN: [id number]  HPI  Subjective:   Patient here for Medicare annual wellness visit and management of other chronic and acute problems.  Patient presents today for complete physical.  Immunizations: due for pneumovax and zostavax Diet: healthy Exercise: walks, strength/core trainin Colonoscopy: up to date Dexa: due- pt wishes to schedule Pap Smear: 8/14. Sees GYN- richard Holland Mammogram: up to date  Hypothyroid- on synthroid  Hip tendon tear- left hip- has surgery scheduled with Dr. Maureen Ralphs  Risk factors:   Roster of Physicians Providing Medical Care to Patient: Dr. Matthew Saras Dr. Maureen Ralphs    Activities of Daily Living  In your present state of health, do you have any difficulty performing the following activities? Preparing food and eating?: No  Bathing yourself: No  Getting dressed: No  Using the toilet:No  Moving around from place to place: No  In the past year have you fallen or had a near fall?:No    Home Safety: Has smoke detector and wears seat belts. No firearms. No excess sun exposure.  Diet and Exercise  Current exercise habits:  regulra Dietary issues discussed: healthy diet   Depression Screen  (Note: if answer to either of the following is "Yes", then a more complete depression screening is indicated)  Q1: Over the past two weeks, have you felt down, depressed or hopeless?no  Q2: Over the past two weeks, have you felt little interest or pleasure in doing things? no   The following portions of the patient's history were reviewed and updated as appropriate: allergies, current medications, past family history, past medical history, past social history, past surgical history and problem list.   Objective:   Vision: see nuring Hearing: wears hearing aids- able to hear forced whisper at 6 feet Body mass index: Cognitive Impairment Assessment: cognition, memory and judgment  appear normal.  Assessment:   Medicare wellness utd on preventive parameters   Plan:    During the course of the visit the patient was educated and counseled about appropriate screening and preventive services including:  Bone densitometry screening  Diabetes screening  Nutrition counseling   Vaccines / LABS Zostavax rx- hand written and give to pt. She will bring to pharmacy. Pnemonccoal Vaccine  today  PSA  Patient Instructions (the written plan) was given to the patient.       Review of Systems     Objective:   Physical Exam Physical Exam  Constitutional: She is oriented to person, place, and time. She appears well-developed and well-nourished. No distress.  HENT:  Head: Normocephalic and atraumatic.  Right Ear: Tympanic membrane and ear canal normal.  Left Ear: Tympanic membrane and ear canal normal.  Mouth/Throat: Oropharynx is clear and moist.  Eyes: Pupils are equal, round, and reactive to light. No scleral icterus.  Neck: Normal range of motion. No thyromegaly present.  Cardiovascular: Normal rate and regular rhythm.   No murmur heard. Pulmonary/Chest: Effort normal and breath sounds normal. No respiratory distress. He has no wheezes. She has no rales. She exhibits no tenderness.  Abdominal: Soft. Bowel sounds are normal. He exhibits no distension and no mass. There is no tenderness. There is no rebound and no guarding.  Musculoskeletal: She exhibits no edema.  Lymphadenopathy:    She has no cervical adenopathy.  Neurological: She is alert and oriented to person, place, and time.  She exhibits normal muscle tone. Coordination normal.  Skin:  Skin is warm and dry. Mild ecchymosis noted left forearm Psychiatric: She has a normal mood and affect. Her behavior is normal. Judgment and thought content normal.          Assessment & Plan:          Assessment & Plan:

## 2013-12-31 ENCOUNTER — Encounter (HOSPITAL_COMMUNITY)
Admission: RE | Admit: 2013-12-31 | Discharge: 2013-12-31 | Disposition: A | Discharge status: Home / Self Care | Payer: Medicare Other | Source: Ambulatory Visit | Attending: Orthopedic Surgery | Admitting: Orthopedic Surgery

## 2013-12-31 ENCOUNTER — Encounter (HOSPITAL_COMMUNITY): Payer: Self-pay

## 2013-12-31 DIAGNOSIS — Z01818 Encounter for other preprocedural examination: Secondary | ICD-10-CM | POA: Insufficient documentation

## 2013-12-31 HISTORY — PX: FINGER SURGERY: SHX640

## 2013-12-31 HISTORY — DX: Hypothyroidism, unspecified: E03.9

## 2013-12-31 NOTE — Patient Instructions (Signed)
[place]  12/31/2013   Your procedure is scheduled on:   01-08-2014  Report to [place] at    1045    AM .  Call this number if you have problems the morning of surgery: [phone]  Or Presurgical Testing [phone](Dodie Parisi) For Living Will and/or Health Care Power Attorney Forms: please provide copy for your medical record,may bring AM of surgery(Forms should be already notarized -we do not provide this service).     Do not eat food:After Midnight.  May have Clear liquids -tea, black coffee, apple or grape juice, broth.May have until 0700 AM.  Take these medicines the morning of surgery with A SIP OF WATER: Levothyroxine   Do not wear jewelry, make-up or nail polish.  Do not wear lotions, powders, or perfumes. You may wear deodorant.  Do not shave 12 hours prior to first CHG shower(legs and under arms).(face and neck okay.)  Do not bring valuables to the hospital.(Hospital is not responsible for lost valuables).  Contacts, dentures or removable bridgework, body piercing, hair pins may not be worn into surgery.  Leave suitcase in the car. After surgery it may be brought to your room.  For patients admitted to the hospital, checkout time is 11:00 AM the day of discharge.(Restricted visitors-Persons, age 23 or younger - may not visit at this time.)    Patients discharged the day of surgery will not be allowed to drive home. Must have responsible person with you x 24 hours once discharged.  Name and phone number of your driver:Ray Wiemers -spouse [phone] cell   Special Instructions: CHG(Chlorhedine 4%-"Hibiclens","Betasept","Aplicare") Shower Use Special Wash: see special instructions.(avoid face and genitals)   Please read over the following fact sheets that you were given:  Incentive Spirometry Instruction.    Failure to follow these instructions may result in Cancellation of your surgery.   Patient  signature_______________________________________________________

## 2014-01-08 ENCOUNTER — Ambulatory Visit (HOSPITAL_COMMUNITY)
Admission: RE | Admit: 2014-01-08 | Discharge: 2014-01-08 | Disposition: A | Discharge status: Home / Self Care | Payer: Medicare Other | Source: Ambulatory Visit | Attending: Orthopedic Surgery | Admitting: Orthopedic Surgery

## 2014-01-08 ENCOUNTER — Encounter (HOSPITAL_COMMUNITY): Payer: Medicare Other | Admitting: Certified Registered"

## 2014-01-08 ENCOUNTER — Ambulatory Visit (HOSPITAL_COMMUNITY): Payer: Medicare Other | Admitting: Certified Registered"

## 2014-01-08 ENCOUNTER — Encounter (HOSPITAL_COMMUNITY)
Admission: RE | Disposition: A | Discharge status: Home / Self Care | Payer: Self-pay | Source: Ambulatory Visit | Attending: Orthopedic Surgery

## 2014-01-08 ENCOUNTER — Encounter (HOSPITAL_COMMUNITY): Payer: Self-pay | Admitting: *Deleted

## 2014-01-08 DIAGNOSIS — M1710 Unilateral primary osteoarthritis, unspecified knee: Secondary | ICD-10-CM | POA: Insufficient documentation

## 2014-01-08 DIAGNOSIS — R32 Unspecified urinary incontinence: Secondary | ICD-10-CM | POA: Insufficient documentation

## 2014-01-08 DIAGNOSIS — F1021 Alcohol dependence, in remission: Secondary | ICD-10-CM | POA: Insufficient documentation

## 2014-01-08 DIAGNOSIS — E039 Hypothyroidism, unspecified: Secondary | ICD-10-CM | POA: Insufficient documentation

## 2014-01-08 DIAGNOSIS — X58XXXA Exposure to other specified factors, initial encounter: Secondary | ICD-10-CM | POA: Insufficient documentation

## 2014-01-08 DIAGNOSIS — Z87891 Personal history of nicotine dependence: Secondary | ICD-10-CM | POA: Insufficient documentation

## 2014-01-08 DIAGNOSIS — M19049 Primary osteoarthritis, unspecified hand: Secondary | ICD-10-CM | POA: Insufficient documentation

## 2014-01-08 DIAGNOSIS — Z7982 Long term (current) use of aspirin: Secondary | ICD-10-CM | POA: Insufficient documentation

## 2014-01-08 DIAGNOSIS — M76899 Other specified enthesopathies of unspecified lower limb, excluding foot: Secondary | ICD-10-CM | POA: Insufficient documentation

## 2014-01-08 DIAGNOSIS — E079 Disorder of thyroid, unspecified: Secondary | ICD-10-CM | POA: Insufficient documentation

## 2014-01-08 DIAGNOSIS — Z79899 Other long term (current) drug therapy: Secondary | ICD-10-CM | POA: Insufficient documentation

## 2014-01-08 DIAGNOSIS — IMO0002 Reserved for concepts with insufficient information to code with codable children: Secondary | ICD-10-CM | POA: Insufficient documentation

## 2014-01-08 DIAGNOSIS — M7070 Other bursitis of hip, unspecified hip: Secondary | ICD-10-CM | POA: Diagnosis present

## 2014-01-08 HISTORY — PX: EXCISION/RELEASE BURSA HIP: SHX5014

## 2014-01-08 SURGERY — RELEASE, BURSA, TROCHANTERIC
Anesthesia: Spinal | Site: Hip | Laterality: Left

## 2014-01-08 MED ORDER — ACETAMINOPHEN 10 MG/ML IV SOLN
1000.0000 mg | Freq: Once | INTRAVENOUS | Status: AC
  Administered 2014-01-08: 1000 mg via INTRAVENOUS
  Filled 2014-01-08: qty 100

## 2014-01-08 MED ORDER — PROMETHAZINE HCL 25 MG/ML IJ SOLN: 6.2500 mg | INTRAMUSCULAR | Status: DC | PRN

## 2014-01-08 MED ORDER — MIDAZOLAM HCL 2 MG/2ML IJ SOLN
INTRAMUSCULAR | Status: AC
  Filled 2014-01-08: qty 2

## 2014-01-08 MED ORDER — BUPIVACAINE HCL 0.25 % IJ SOLN
INTRAMUSCULAR | Status: DC | PRN
  Administered 2014-01-08: 20 mL

## 2014-01-08 MED ORDER — FENTANYL CITRATE 0.05 MG/ML IJ SOLN: 25.0000 ug | INTRAMUSCULAR | Status: DC | PRN

## 2014-01-08 MED ORDER — ONDANSETRON HCL 4 MG/2ML IJ SOLN
INTRAMUSCULAR | Status: AC
  Filled 2014-01-08: qty 2

## 2014-01-08 MED ORDER — MEPERIDINE HCL 50 MG/ML IJ SOLN: 6.2500 mg | INTRAMUSCULAR | Status: DC | PRN

## 2014-01-08 MED ORDER — PROPOFOL INFUSION 10 MG/ML OPTIME
INTRAVENOUS | Status: DC | PRN
  Administered 2014-01-08: 100 ug/kg/min via INTRAVENOUS

## 2014-01-08 MED ORDER — CHLORHEXIDINE GLUCONATE 4 % EX LIQD: 60.0000 mL | Freq: Once | CUTANEOUS | Status: DC

## 2014-01-08 MED ORDER — ACETAMINOPHEN 10 MG/ML IV SOLN
INTRAVENOUS | Status: DC | PRN
  Administered 2014-01-08: 1000 mg via INTRAVENOUS

## 2014-01-08 MED ORDER — BUPIVACAINE LIPOSOME 1.3 % IJ SUSP
INTRAMUSCULAR | Status: DC | PRN
  Administered 2014-01-08: 14:00:00

## 2014-01-08 MED ORDER — CEFAZOLIN SODIUM 1 G IJ SOLR: 2.0000 g | INTRAMUSCULAR | Status: DC | PRN

## 2014-01-08 MED ORDER — METHOCARBAMOL 500 MG PO TABS
500.0000 mg | ORAL_TABLET | Freq: Once | ORAL | Status: AC
  Administered 2014-01-08: 500 mg via ORAL
  Filled 2014-01-08: qty 1

## 2014-01-08 MED ORDER — DEXAMETHASONE SODIUM PHOSPHATE 10 MG/ML IJ SOLN
10.0000 mg | Freq: Once | INTRAMUSCULAR | Status: AC
  Administered 2014-01-08: 10 mg via INTRAVENOUS

## 2014-01-08 MED ORDER — BUPIVACAINE HCL (PF) 0.25 % IJ SOLN
INTRAMUSCULAR | Status: AC
  Filled 2014-01-08: qty 30

## 2014-01-08 MED ORDER — PROPOFOL 10 MG/ML IV BOLUS
INTRAVENOUS | Status: AC
  Filled 2014-01-08: qty 20

## 2014-01-08 MED ORDER — EPHEDRINE SULFATE 50 MG/ML IJ SOLN
INTRAMUSCULAR | Status: DC | PRN
  Administered 2014-01-08 (×2): 5 mg via INTRAVENOUS

## 2014-01-08 MED ORDER — CEFAZOLIN SODIUM-DEXTROSE 2-3 GM-% IV SOLR: 2.0000 g | INTRAVENOUS | Status: DC | PRN

## 2014-01-08 MED ORDER — FENTANYL CITRATE 0.05 MG/ML IJ SOLN
INTRAMUSCULAR | Status: AC
  Filled 2014-01-08: qty 2

## 2014-01-08 MED ORDER — SODIUM CHLORIDE 0.9 % IV SOLN
INTRAVENOUS | Status: DC
Start: 2014-01-08 — End: 2014-01-08

## 2014-01-08 MED ORDER — CEFAZOLIN SODIUM-DEXTROSE 2-3 GM-% IV SOLR
INTRAVENOUS | Status: AC
  Filled 2014-01-08: qty 50

## 2014-01-08 MED ORDER — HYDROCODONE-ACETAMINOPHEN 5-325 MG PO TABS: 1.0000 | ORAL_TABLET | Freq: Four times a day (QID) | ORAL | Status: DC | PRN

## 2014-01-08 MED ORDER — DEXAMETHASONE SODIUM PHOSPHATE 10 MG/ML IJ SOLN
INTRAMUSCULAR | Status: AC
  Filled 2014-01-08: qty 1

## 2014-01-08 MED ORDER — BUPIVACAINE LIPOSOME 1.3 % IJ SUSP
20.0000 mL | Freq: Once | INTRAMUSCULAR | Status: DC
Start: 2014-01-08 — End: 2014-01-08
  Filled 2014-01-08: qty 20

## 2014-01-08 MED ORDER — BUPIVACAINE IN DEXTROSE 0.75-8.25 % IT SOLN
INTRATHECAL | Status: DC | PRN
  Administered 2014-01-08: 1.8 mL via INTRATHECAL

## 2014-01-08 MED ORDER — METHOCARBAMOL 500 MG PO TABS: 500.0000 mg | ORAL_TABLET | Freq: Four times a day (QID) | ORAL | Status: DC

## 2014-01-08 MED ORDER — OXYCODONE HCL 5 MG PO TABS
5.0000 mg | ORAL_TABLET | ORAL | Status: DC | PRN
  Administered 2014-01-08: 5 mg via ORAL
  Filled 2014-01-08: qty 1

## 2014-01-08 MED ORDER — EPHEDRINE SULFATE 50 MG/ML IJ SOLN
INTRAMUSCULAR | Status: AC
  Filled 2014-01-08: qty 1

## 2014-01-08 MED ORDER — LACTATED RINGERS IV SOLN
INTRAVENOUS | Status: DC
  Administered 2014-01-08 (×2): via INTRAVENOUS

## 2014-01-08 MED ORDER — OXYCODONE HCL 5 MG PO TABS: 5.0000 mg | ORAL_TABLET | ORAL | Status: DC | PRN

## 2014-01-08 MED ORDER — CEFAZOLIN SODIUM-DEXTROSE 2-3 GM-% IV SOLR
INTRAVENOUS | Status: DC | PRN
  Administered 2014-01-08: 2 g via INTRAVENOUS

## 2014-01-08 MED ORDER — SODIUM CHLORIDE 0.9 % IJ SOLN
INTRAMUSCULAR | Status: AC
  Filled 2014-01-08: qty 50

## 2014-01-08 MED ORDER — 0.9 % SODIUM CHLORIDE (POUR BTL) OPTIME
TOPICAL | Status: DC | PRN
  Administered 2014-01-08: 1000 mL

## 2014-01-08 SURGICAL SUPPLY — 44 items
ANCH SUT 2 CP-2 EBND QANCHR+ (Anchor) ×2 IMPLANT
ANCHOR SUPER QUICK (Anchor) ×3 IMPLANT
BAG SPEC THK2 15X12 ZIP CLS (MISCELLANEOUS)
BAG ZIPLOCK 12X15 (MISCELLANEOUS) ×1 IMPLANT
BIT DRILL 2.8 QUICK RELEASE (BIT) ×1 IMPLANT
BLADE EXTENDED COATED 6.5IN (ELECTRODE) ×1 IMPLANT
DRAPE INCISE IOBAN 66X45 STRL (DRAPES) ×2 IMPLANT
DRAPE ORTHO SPLIT 77X108 STRL (DRAPES) ×4
DRAPE POUCH INSTRU U-SHP 10X18 (DRAPES) ×2 IMPLANT
DRAPE SURG ORHT 6 SPLT 77X108 (DRAPES) ×2 IMPLANT
DRAPE U-SHAPE 47X51 STRL (DRAPES) ×2 IMPLANT
DRILL 2.8 QUICK RELEASE (BIT)
DRSG ADAPTIC 3X8 NADH LF (GAUZE/BANDAGES/DRESSINGS) ×2 IMPLANT
DRSG MEPILEX BORDER 4X4 (GAUZE/BANDAGES/DRESSINGS) IMPLANT
DRSG MEPILEX BORDER 4X8 (GAUZE/BANDAGES/DRESSINGS) ×1 IMPLANT
DURAPREP 26ML APPLICATOR (WOUND CARE) ×2 IMPLANT
ELECT REM PT RETURN 9FT ADLT (ELECTROSURGICAL) ×2
ELECTRODE REM PT RTRN 9FT ADLT (ELECTROSURGICAL) ×1 IMPLANT
GLOVE BIO SURGEON STRL SZ7.5 (GLOVE) ×2 IMPLANT
GLOVE BIO SURGEON STRL SZ8 (GLOVE) ×3 IMPLANT
GLOVE BIOGEL PI IND STRL 8 (GLOVE) ×3 IMPLANT
GLOVE BIOGEL PI INDICATOR 8 (GLOVE) ×2
GOWN STRL REUS W/TWL LRG LVL3 (GOWN DISPOSABLE) ×2 IMPLANT
GOWN STRL REUS W/TWL XL LVL3 (GOWN DISPOSABLE) ×3 IMPLANT
KIT BASIN OR (CUSTOM PROCEDURE TRAY) ×2 IMPLANT
MANIFOLD NEPTUNE II (INSTRUMENTS) ×2 IMPLANT
NDL SAFETY ECLIPSE 18X1.5 (NEEDLE) ×2 IMPLANT
NEEDLE HYPO 18GX1.5 SHARP (NEEDLE) ×4
NS IRRIG 1000ML POUR BTL (IV SOLUTION) ×2 IMPLANT
PACK TOTAL JOINT (CUSTOM PROCEDURE TRAY) ×2 IMPLANT
PASSER SUT SWANSON 36MM LOOP (INSTRUMENTS) ×1 IMPLANT
POSITIONER SURGICAL ARM (MISCELLANEOUS) ×2 IMPLANT
SPONGE GAUZE 4X4 12PLY (GAUZE/BANDAGES/DRESSINGS) ×2 IMPLANT
STAPLER VISISTAT 35W (STAPLE) ×1 IMPLANT
STRIP CLOSURE SKIN 1/2X4 (GAUZE/BANDAGES/DRESSINGS) ×3 IMPLANT
SUT ETHIBOND NAB CT1 #1 30IN (SUTURE) ×2 IMPLANT
SUT MNCRL AB 4-0 PS2 18 (SUTURE) ×2 IMPLANT
SUT VIC AB 1 CT1 27 (SUTURE)
SUT VIC AB 1 CT1 27XBRD ANTBC (SUTURE) ×3 IMPLANT
SUT VIC AB 2-0 CT1 27 (SUTURE) ×6
SUT VIC AB 2-0 CT1 TAPERPNT 27 (SUTURE) ×3 IMPLANT
SYR 20CC LL (SYRINGE) ×2 IMPLANT
SYR 50ML LL SCALE MARK (SYRINGE) ×2 IMPLANT
TOWEL OR 17X26 10 PK STRL BLUE (TOWEL DISPOSABLE) ×3 IMPLANT

## 2014-01-08 NOTE — Interval H&P Note (Signed)
History and Physical Interval Note:  01/08/2014 12:40 PM  Erin Carroll  has presented today for surgery, with the diagnosis of left hip gluteal tendon tear  The various methods of treatment have been discussed with the patient and family. After consideration of risks, benefits and other options for treatment, the patient has consented to  Procedure(s): [place] (Left) as a surgical intervention .  The patient's history has been reviewed, patient examined, no change in status, stable for surgery.  I have reviewed the patient's chart and labs.  Questions were answered to the patient's satisfaction.     [name]

## 2014-01-08 NOTE — Brief Op Note (Signed)
01/08/2014  2:14 PM  PATIENT:  Erin Carroll  68 y.o. female  PRE-OPERATIVE DIAGNOSIS:  left hip gluteal tendon tear  POST-OPERATIVE DIAGNOSIS:  left hip gluteal tendon tear  PROCEDURE:  Procedure(s): LEFT HIP BURSECTOMY WITH GLUTEAL TENDON REPAIR (Left)  SURGEON:  Surgeon(s) and Role:    * [name], MD - Primary  PHYSICIAN ASSISTANT:   ASSISTANTS: Arlee Muslim, PA-C   ANESTHESIA:   spinal  EBL:  Total I/O In: 1000 [I.V.:1000] Out: -   LOCAL MEDICATIONS USED:  OTHER Exparel  COUNTS:  YES  TOURNIQUET:  * No tourniquets in log *  DICTATION: .Other Dictation: Dictation Number [id number]  PLAN OF CARE: Discharge to home after PACU  PATIENT DISPOSITION:  PACU - hemodynamically stable.

## 2014-01-08 NOTE — Anesthesia Preprocedure Evaluation (Signed)
Anesthesia Evaluation  Patient identified by MRN, date of birth, ID band Patient awake    Reviewed: Allergy & Precautions, H&P , NPO status , Patient's Chart, lab work & pertinent test results  Airway Mallampati: II TM Distance: >3 FB Neck ROM: Full    Dental no notable dental hx.    Pulmonary neg pulmonary ROS, former smoker,  breath sounds clear to auscultation  Pulmonary exam normal       Cardiovascular negative cardio ROS  Rhythm:Regular Rate:Normal     Neuro/Psych negative neurological ROS  negative psych ROS   GI/Hepatic negative GI ROS, Neg liver ROS,   Endo/Other  negative endocrine ROSHypothyroidism   Renal/GU negative Renal ROS  negative genitourinary   Musculoskeletal negative musculoskeletal ROS (+)   Abdominal   Peds negative pediatric ROS (+)  Hematology negative hematology ROS (+)   Anesthesia Other Findings   Reproductive/Obstetrics negative OB ROS                           Anesthesia Physical Anesthesia Plan  ASA: II  Anesthesia Plan: Spinal   Post-op Pain Management:    Induction:   Airway Management Planned: Simple Face Mask  Additional Equipment:   Intra-op Plan:   Post-operative Plan:   Informed Consent: I have reviewed the patients History and Physical, chart, labs and discussed the procedure including the risks, benefits and alternatives for the proposed anesthesia with the patient or authorized representative who has indicated his/her understanding and acceptance.   Dental advisory given  Plan Discussed with: CRNA  Anesthesia Plan Comments:         Anesthesia Quick Evaluation

## 2014-01-08 NOTE — Anesthesia Procedure Notes (Signed)
Spinal  Patient location during procedure: OR End time: 01/08/2014 1:22 PM Staffing CRNA/Resident: Noralyn Pick Performed by: anesthesiologist and resident/CRNA  Preanesthetic Checklist Completed: patient identified, site marked, surgical consent, pre-op evaluation, timeout performed, IV checked, risks and benefits discussed and monitors and equipment checked Spinal Block Patient position: sitting Prep: Betadine Patient monitoring: heart rate, continuous pulse ox and blood pressure Approach: midline Location: L2-3 Injection technique: single-shot Needle Needle type: Sprotte  Needle gauge: 24 G Needle length: 9 cm Assessment Sensory level: T6 Additional Notes Expiration date of kit checked and confirmed. Patient tolerated procedure well, without complications.

## 2014-01-08 NOTE — H&P (Signed)
  CC- Erin Carroll is a 68 y.o. female who presents with left hip pain  Hip Pain: Patient complains of left hip pain. Onset of the symptoms was several months ago. Inciting event: none. Current symptoms include lateral hip pain. Associated symptoms: none. Aggravating symptoms: lying on left side.  Evaluation to date: MRI with gluteal tendon tear.  Treatment to date: injection with partial temporary relief.  Past Medical History  Diagnosis Date  . Thyroid disease     problems with thyroid  . Alcoholism /alcohol abuse     sober since 1996  . Hypothyroidism   . Arthritis     hands, knees.    Past Surgical History  Procedure Laterality Date  . Right knee arthroscopy  1987/1991  . Shoulder arthroscopy  2004  . Breast biopsy  1977    left  . Tonsillectomy    . Metacarpophalangeal joint arthroplasty Bilateral     bilateral thumbs- Gramig  . Finger surgery Left 12-31-13    left middle finger cyst -aspirated    Prior to Admission medications   Medication Sig Start Date End Date Taking? Authorizing Provider  aspirin 81 MG tablet Take 81 mg by mouth daily.      Historical Provider, MD  celecoxib (CELEBREX) 200 MG capsule Take 200 mg by mouth daily.    Historical Provider, MD  estradiol-norethindrone [facility]) 0.05-0.14 MG/DAY Place 1 patch onto the skin once a week.    Historical Provider, MD  levothyroxine (SYNTHROID, LEVOTHROID) 75 MCG tablet Take 75 mcg by mouth daily before breakfast.    Historical Provider, MD  Multiple Vitamin (MULTIVITAMIN) tablet Take 1 tablet by mouth daily.      Historical Provider, MD    Physical Examination: General appearance - alert, well appearing, and in no distress Mental status - alert, oriented to person, place, and time Chest - clear to auscultation, no wheezes, rales or rhonchi, symmetric air entry Heart - normal rate, regular rhythm, normal S1, S2, no murmurs, rubs, clicks or gallops Abdomen - soft, nontender, nondistended, no masses or  organomegaly Neurological - alert, oriented, normal speech, no focal findings or movement disorder noted  A left hip exam was performed. SKIN: intact SWELLING: none WARMTH: no warmth TENDERNESS: maximal at greater trochanter ROM: normal STRENGTH: slight weakness with hip abduction GAIT: normal  ASSESSMENT: Left hip gluteal tendon tear  Plan: Left hip bursectomy and gluteal tendon tear. Discussed procedure, risks and potential complications with patient who elects to proceed.  Dione Plover Erin Licea, MD    01/08/2014, 10:58 AM

## 2014-01-08 NOTE — Anesthesia Postprocedure Evaluation (Signed)
  Anesthesia Post-op Note  Patient: Erin Carroll  Procedure(s) Performed: Procedure(s) (LRB): LEFT HIP BURSECTOMY WITH GLUTEAL TENDON REPAIR (Left)  Patient Location: PACU  Anesthesia Type: Spinal  Level of Consciousness: awake and alert   Airway and Oxygen Therapy: Patient Spontanous Breathing  Post-op Pain: mild  Post-op Assessment: Post-op Vital signs reviewed, Patient's Cardiovascular Status Stable, Respiratory Function Stable, Patent Airway and No signs of Nausea or vomiting  Last Vitals:  Filed Vitals:   01/08/14 1629  BP: 110/64  Pulse: 54  Temp: 36.4 C  Resp: 14    Post-op Vital Signs: stable   Complications: No apparent anesthesia complications

## 2014-01-08 NOTE — Transfer of Care (Signed)
Immediate Anesthesia Transfer of Care Note  Patient: Erin Carroll  Procedure(s) Performed: Procedure(s): LEFT HIP BURSECTOMY WITH GLUTEAL TENDON REPAIR (Left)  Patient Location: PACU  Anesthesia Type:Regional  Level of Consciousness: awake, alert  and oriented  Airway & Oxygen Therapy: Patient Spontanous Breathing and Patient connected to face mask oxygen  Post-op Assessment: Report given to PACU RN and Post -op Vital signs reviewed and stable  Post vital signs: Reviewed and stable  Complications: No apparent anesthesia complications

## 2014-01-08 NOTE — Progress Notes (Addendum)
Patient reports that she does not like hydrocodone as it makes her feel high. She prefers oxycodone. Called into OR and spoke with Dr Wynelle Link. He will write Rx for oxycodone when he gets out of surgery.  71 Dr Wynelle Link delivered prescription for oxycodone IR 5 mg-10 mg Q4H to patient's room.

## 2014-01-08 NOTE — Progress Notes (Signed)
Patient stood at bedside with walker s/p spinal for bursectomy. Able to stand but legs are weak. She was incontinent of urine and unaware of it. Back to bed and skin care given.

## 2014-01-09 NOTE — Op Note (Signed)
NAMEMADDUX, FIRST NO.:  [id number]  MEDICAL RECORD NO.:  [id number]  LOCATION:  [facility]                         FACILITY:  [facility]  PHYSICIAN:  Gaynelle Arabian, M.D.    DATE OF BIRTH:  05-26-1946  DATE OF PROCEDURE:  01/08/2014 DATE OF DISCHARGE:  01/08/2014                              OPERATIVE REPORT   PREOPERATIVE DIAGNOSIS:  Left hip intractable bursitis with gluteal tendon tear.  POSTOPERATIVE DIAGNOSIS:  Left hip intractable bursitis with gluteal tendon tear.  PROCEDURE:  Left hip bursectomy with gluteal tendon repair.  SURGEON:  Gaynelle Arabian, MD  ASSISTANT:  Alexzandrew L. Perkins, PA-C  ANESTHESIA:  Spinal.  ESTIMATED BLOOD LOSS:  Minimal.  DRAINS:  None.  COMPLICATIONS:  None.  CONDITION:  Stable to recovery.  BRIEF CLINICAL NOTE:  Ms. Gignac is a 68 year old female, who has a several month history of significant left lateral hip pain, which has been refractory to nonoperative management including therapeutic exercise and injection.  She had an MRI scan which showed a gluteus medius tendon tear.  She presents now for left hip bursectomy and tendon repair.  PROCEDURE IN DETAIL:  After successful administration of spinal anesthetic, the patient was placed in the right lateral decubitus position with the left side up and held with the hip positioner.  Left lower extremity was isolated from her perineum with plastic drapes and prepped and draped in the usual sterile fashion.  Short lateral incision was made centered over the tip of the greater trochanter.  Skin cut with a 10 blade through subcutaneous tissue to the level of the fascia lata. This was incised in line with the skin incision.  The bursa was identified, fluid filled and thickened.  The bursa was removed with electrocautery.  Meticulous hemostasis was achieved throughout the procedure.  There was a lot of thickened bursa right at the tip of the trochanter.  I removed that and  there was an obvious tear of about 3rd of the gluteus medius tendon and started anterior and courses to the middle aspect of the tendon.  There was a spur present at the tip of the trochanter and this was excised with a rongeur.  I created a trough in the greater trochanter with a rongeur and placed 2 Mitek anchors into the trough.  The sutures were passed through the free edges of the tendon and the tendon is advanced down to the trough and the sutures were tied and then oversewn.  This was a very stable repair.  The hips again inspected and rotated and no other tears were identified.  The wound was copiously irrigated with saline solution and then the fascia lata is closed with a running #1 V-Loc suture and a small triangular area of fascia lata is removed over the tip of the greater trochanter to prevent friction.  We then injected the gluteus medius and the fascia lata, as well as gluteus maximus and subcu tissues with a combination of 20 mL Exparel and 30 mL saline and injected with an additional 20 mL of 0.25% Marcaine into the same tissues.  Subcu then closed with interrupted 2-0 Vicryl in 2 layers and subcuticular running 4-0 Monocryl.  Incision  was cleaned and dried and Steri-Strips and a sterile dressing applied.  She was awakened and transported to recovery in stable condition.     Gaynelle Arabian, M.D.     FA/MEDQ  D:  01/08/2014  T:  01/09/2014  Job:  [id number]

## 2014-01-11 ENCOUNTER — Telehealth: Payer: Self-pay | Admitting: *Deleted

## 2014-01-11 ENCOUNTER — Encounter (HOSPITAL_COMMUNITY): Payer: Self-pay | Admitting: Orthopedic Surgery

## 2014-01-11 DIAGNOSIS — D649 Anemia, unspecified: Secondary | ICD-10-CM

## 2014-01-11 NOTE — Telephone Encounter (Signed)
Message copied by Ronny Flurry on Mon Jan 11, 2014  8:48 AM ------      Message from: Debbrah Alar      Created: Sat Jan 09, 2014  4:35 PM       Please ask pt to return for additional blood work due to mild anemia.   Cholesterol is mildly elevated. She should work on low fat/low cholesterol diet and exercise. Thyroid testing looks good. Liver testing ok. ------

## 2014-01-11 NOTE — Telephone Encounter (Signed)
Message copied by Ronny Flurry on Mon Jan 11, 2014  1:07 PM ------      Message from: O'SULLIVAN, MELISSA      Created: Mon Jan 11, 2014  9:12 AM      Regarding: RE: b12/folate dx       I don't have another diagnosis code unfortunately.  If pt wants to pay out of pocket she can or we will need to cancel b12/folate.       ----- Message -----         From: Ronny Flurry, CMA         Sent: 01/11/2014   9:04 AM           To: Debbrah Alar, NP      Subject: b12/folate dx                                            B12 / folate is flagging as non-covered diagnosis for 285.9.  Is there another dx code we can use?       ------

## 2014-01-11 NOTE — Telephone Encounter (Signed)
Notified pt and she voices understanding. She states she just had surgery and is not able to drive yet. Will be about 2 weeks before she can complete additional tests. Future lab orders entered.  IFOB kit mailed to pt.    Notes Recorded by Debbrah Alar, NP on 01/09/2014 at 4:35 PM Please ask pt to return for additional blood work due to mild anemia. Cholesterol is mildly elevated. She should work on low fat/low cholesterol diet and exercise. Thyroid testing looks good. Liver testing ok. Notes Recorded by Ronny Flurry, CMA on 01/05/2014 at 9:44 AM Lab is unable to add additional tests. I can send pt a letter. Were her other results normal? Notes Recorded by Debbrah Alar, NP on 01/04/2014 at 8:14 AM Please call lab and ask them to add on serum iron, tibc, ferritin, folate, b12- dx anemia. I would also like for pt to complete and IFOB kit please.

## 2014-02-02 ENCOUNTER — Telehealth: Payer: Self-pay | Admitting: Family

## 2014-02-02 MED ORDER — LEVOTHYROXINE SODIUM 75 MCG PO TABS: 75.0000 ug | ORAL_TABLET | Freq: Every day | ORAL | Status: DC

## 2014-02-02 NOTE — Telephone Encounter (Signed)
Refill synthroid

## 2014-02-02 NOTE — Telephone Encounter (Signed)
Refill sent.

## 2014-02-05 ENCOUNTER — Telehealth: Payer: Self-pay | Admitting: Family

## 2014-02-05 NOTE — Telephone Encounter (Signed)
Patient called in to speak with me about her medication refill, She needs to have a new script sent to the pharmacy for a 90 day supply with 1 refill due to her copay. She would like for this to be sent in to [facility] on [address] for them to have on file when her refill is due.

## 2014-02-08 MED ORDER — LEVOTHYROXINE SODIUM 75 MCG PO TABS: 75.0000 ug | ORAL_TABLET | Freq: Every day | ORAL | Status: DC

## 2014-02-08 NOTE — Telephone Encounter (Signed)
Refill sent to pharmacy.   

## 2014-03-02 ENCOUNTER — Ambulatory Visit: Payer: Medicare Other | Admitting: Family Medicine

## 2014-03-10 NOTE — Telephone Encounter (Signed)
Spoke with pt. She states she never received IFOB kit in the mail and does not wish to proceed with additional lab tests at this time. States she just had a rectal manometry done today to determine if she has a rectal prolapse. States she is in the process of finding another PCP and does not wish to persue further testing at this time.

## 2014-03-10 NOTE — Telephone Encounter (Signed)
Per [employer], selfpay cost for b12/folate will be $150. Attempted to reach pt and left message to return my call.

## 2014-04-02 HISTORY — PX: RECTAL PROLAPSE REPAIR: SHX759

## 2014-06-18 ENCOUNTER — Other Ambulatory Visit: Payer: Self-pay | Admitting: Internal Medicine

## 2014-06-18 NOTE — Telephone Encounter (Signed)
Please advise refill? Patient has not taken since 2013.

## 2014-06-21 NOTE — Telephone Encounter (Signed)
Opened in error

## 2014-12-23 ENCOUNTER — Ambulatory Visit: Payer: Self-pay | Admitting: Orthopedic Surgery

## 2014-12-23 NOTE — Progress Notes (Signed)
Preoperative surgical orders have been place into the Epic hospital system for Erin Carroll on 12/23/2014, 5:36 PM  by Mickel Crow for surgery on 01-10-2015.  Preop Total Knee orders including Experal, IV Tylenol, and IV Decadron as long as there are no contraindications to the above medications. Arlee Muslim, PA-C

## 2014-12-31 NOTE — Patient Instructions (Addendum)
Erin Carroll  12/31/2014   Your procedure is scheduled on: 01/10/15   Report to [facility] Main  Entrance and follow signs to               [place] at 7:30 AM.   Call this number if you have problems the morning of surgery [phone]   Remember:  Do not eat food or drink liquids :After Midnight.     Take these medicines the morning of surgery with A SIP OF WATER: LEVOTHYROXINE / MAY TAKE TRAMADOL OR ROBAXIN IF NEEDED                               You may not have any metal on your body including hair pins and              piercings  Do not wear jewelry, make-up, lotions, powders or perfumes.             Do not wear nail polish.  Do not shave  48 hours prior to surgery.              Men may shave face and neck.   Do not bring valuables to the hospital. [place].  Contacts, dentures or bridgework may not be worn into surgery.  Leave suitcase in the car. After surgery it may be brought to your room.     Patients discharged the day of surgery will not be allowed to drive home.  Name and phone number of your driver:  Special Instructions: N/A              Please read over the following fact sheets you were given: _____________________________________________________________________                                                     [place]  Before surgery, you can play an important role.  Because skin is not sterile, your skin needs to be as free of germs as possible.  You can reduce the number of germs on your skin by washing with CHG (chlorahexidine gluconate) soap before surgery.  CHG is an antiseptic cleaner which kills germs and bonds with the skin to continue killing germs even after washing. Please DO NOT use if you have an allergy to CHG or antibacterial soaps.  If your skin becomes reddened/irritated stop using the CHG and inform your nurse when you arrive  at Short Stay. Do not shave (including legs and underarms) for at least 48 hours prior to the first CHG shower.  You may shave your face. Please follow these instructions carefully:   1.  Shower with CHG Soap the night before surgery and the  morning of Surgery.   2.  If you choose to wash your hair, wash your hair first as usual with your  normal  Shampoo.   3.  After you shampoo, rinse your hair and body thoroughly to remove the  shampoo.  4.  Use CHG as you would any other liquid soap.  You can apply chg directly  to the skin and wash . Gently wash with scrungie or clean wascloth    5.  Apply the CHG Soap to your body ONLY FROM THE NECK DOWN.   Do not use on open                           Wound or open sores. Avoid contact with eyes, ears mouth and genitals (private parts).                        Genitals (private parts) with your normal soap.              6.  Wash thoroughly, paying special attention to the area where your surgery  will be performed.   7.  Thoroughly rinse your body with warm water from the neck down.   8.  DO NOT shower/wash with your normal soap after using and rinsing off  the CHG Soap .                9.  Pat yourself dry with a clean towel.             10.  Wear clean pajamas.             11.  Place clean sheets on your bed the night of your first shower and do not  sleep with pets.  Day of Surgery : Do not apply any lotions/deodorants the morning of surgery.  Please wear clean clothes to the hospital/surgery center.  FAILURE TO FOLLOW THESE INSTRUCTIONS MAY RESULT IN THE CANCELLATION OF YOUR SURGERY    PATIENT SIGNATURE_________________________________  ______________________________________________________________________     [name]  An incentive spirometer is a tool that can help keep your lungs clear and active. This tool measures how well you are filling your lungs with each breath. Taking  long deep breaths may help reverse or decrease the chance of developing breathing (pulmonary) problems (especially infection) following:  A long period of time when you are unable to move or be active. BEFORE THE PROCEDURE   If the spirometer includes an indicator to show your best effort, your nurse or respiratory therapist will set it to a desired goal.  If possible, sit up straight or lean slightly forward. Try not to slouch.  Hold the incentive spirometer in an upright position. INSTRUCTIONS FOR USE   Sit on the edge of your bed if possible, or sit up as far as you can in bed or on a chair.  Hold the incentive spirometer in an upright position.  Breathe out normally.  Place the mouthpiece in your mouth and seal your lips tightly around it.  Breathe in slowly and as deeply as possible, raising the piston or the ball toward the top of the column.  Hold your breath for 3-5 seconds or for as long as possible. Allow the piston or ball to fall to the bottom of the column.  Remove the mouthpiece from your mouth and breathe out normally.  Rest for a few seconds and repeat Steps 1 through 7 at least 10 times every 1-2 hours when you are awake. Take your time and take a few normal breaths between deep breaths.  The spirometer may include an indicator to show your best effort. Use the indicator as a goal to work toward during  each repetition.  After each set of 10 deep breaths, practice coughing to be sure your lungs are clear. If you have an incision (the cut made at the time of surgery), support your incision when coughing by placing a pillow or rolled up towels firmly against it. Once you are able to get out of bed, walk around indoors and cough well. You may stop using the incentive spirometer when instructed by your caregiver.  RISKS AND COMPLICATIONS  Take your time so you do not get dizzy or light-headed.  If you are in pain, you may need to take or ask for pain medication before  doing incentive spirometry. It is harder to take a deep breath if you are having pain. AFTER USE  Rest and breathe slowly and easily.  It can be helpful to keep track of a log of your progress. Your caregiver can provide you with a simple table to help with this. If you are using the spirometer at home, follow these instructions: [place] IF:   You are having difficultly using the spirometer.  You have trouble using the spirometer as often as instructed.  Your pain medication is not giving enough relief while using the spirometer.  You develop fever of 100.5 F (38.1 C) or higher. SEEK IMMEDIATE MEDICAL CARE IF:   You cough up bloody sputum that had not been present before.  You develop fever of 102 F (38.9 C) or greater.  You develop worsening pain at or near the incision site. MAKE SURE YOU:   Understand these instructions.  Will watch your condition.  Will get help right away if you are not doing well or get worse. Document Released: 04/01/2007 Document Revised: 02/11/2012 Document Reviewed: 06/02/2007 Exit[employer] Patient Information 2014 [employer], [place].   ________________________________________________________________________  WHAT IS A BLOOD TRANSFUSION? Blood Transfusion Information  A transfusion is the replacement of blood or some of its parts. Blood is made up of multiple cells which provide different functions.  Red blood cells carry oxygen and are used for blood loss replacement.  White blood cells fight against infection.  Platelets control bleeding.  Plasma helps clot blood.  Other blood products are available for specialized needs, such as hemophilia or other clotting disorders. BEFORE THE TRANSFUSION  Who gives blood for transfusions?   Healthy volunteers who are fully evaluated to make sure their blood is safe. This is blood bank blood. Transfusion therapy is the safest it has ever been in the practice of medicine. Before blood is taken  from a donor, a complete history is taken to make sure that person has no history of diseases nor engages in risky social behavior (examples are intravenous drug use or sexual activity with multiple partners). The donor's travel history is screened to minimize risk of transmitting infections, such as malaria. The donated blood is tested for signs of infectious diseases, such as HIV and hepatitis. The blood is then tested to be sure it is compatible with you in order to minimize the chance of a transfusion reaction. If you or a relative donates blood, this is often done in anticipation of surgery and is not appropriate for emergency situations. It takes many days to process the donated blood. RISKS AND COMPLICATIONS Although transfusion therapy is very safe and saves many lives, the main dangers of transfusion include:   Getting an infectious disease.  Developing a transfusion reaction. This is an allergic reaction to something in the blood you were given. Every precaution is taken to prevent  this. The decision to have a blood transfusion has been considered carefully by your caregiver before blood is given. Blood is not given unless the benefits outweigh the risks. AFTER THE TRANSFUSION  Right after receiving a blood transfusion, you will usually feel much better and more energetic. This is especially true if your red blood cells have gotten low (anemic). The transfusion raises the level of the red blood cells which carry oxygen, and this usually causes an energy increase.  The nurse administering the transfusion will monitor you carefully for complications. HOME CARE INSTRUCTIONS  No special instructions are needed after a transfusion. You may find your energy is better. Speak with your caregiver about any limitations on activity for underlying diseases you may have. SEEK MEDICAL CARE IF:   Your condition is not improving after your transfusion.  You develop redness or irritation at the  intravenous (IV) site. SEEK IMMEDIATE MEDICAL CARE IF:  Any of the following symptoms occur over the next 12 hours:  Shaking chills.  You have a temperature by mouth above 102 F (38.9 C), not controlled by medicine.  Chest, back, or muscle pain.  People around you feel you are not acting correctly or are confused.  Shortness of breath or difficulty breathing.  Dizziness and fainting.  You get a rash or develop hives.  You have a decrease in urine output.  Your urine turns a dark color or changes to pink, red, or brown. Any of the following symptoms occur over the next 10 days:  You have a temperature by mouth above 102 F (38.9 C), not controlled by medicine.  Shortness of breath.  Weakness after normal activity.  The white part of the eye turns yellow (jaundice).  You have a decrease in the amount of urine or are urinating less often.  Your urine turns a dark color or changes to pink, red, or brown. Document Released: 11/16/2000 Document Revised: 02/11/2012 Document Reviewed: 07/05/2008 [facility] Patient Information 2014 [place], [place].  _______________________________________________________________________

## 2015-01-03 ENCOUNTER — Encounter (HOSPITAL_COMMUNITY)
Admission: RE | Admit: 2015-01-03 | Discharge: 2015-01-03 | Disposition: A | Discharge status: Home / Self Care | Payer: Medicare Other | Source: Ambulatory Visit | Attending: Orthopedic Surgery | Admitting: Orthopedic Surgery

## 2015-01-03 ENCOUNTER — Encounter (HOSPITAL_COMMUNITY): Payer: Self-pay

## 2015-01-03 DIAGNOSIS — M179 Osteoarthritis of knee, unspecified: Secondary | ICD-10-CM | POA: Diagnosis not present

## 2015-01-03 DIAGNOSIS — Z01818 Encounter for other preprocedural examination: Secondary | ICD-10-CM | POA: Diagnosis present

## 2015-01-03 LAB — URINALYSIS, ROUTINE W REFLEX MICROSCOPIC
BILIRUBIN URINE: NEGATIVE
Glucose, UA: NEGATIVE mg/dL
Hgb urine dipstick: NEGATIVE
Ketones, ur: NEGATIVE mg/dL
LEUKOCYTES UA: NEGATIVE
Nitrite: NEGATIVE
Protein, ur: NEGATIVE mg/dL
SPECIFIC GRAVITY, URINE: 1.006 (ref 1.005–1.030)
Urobilinogen, UA: 0.2 mg/dL (ref 0.0–1.0)
pH: 6 (ref 5.0–8.0)

## 2015-01-03 LAB — PROTIME-INR
INR: 1.01 (ref 0.00–1.49)
Prothrombin Time: 13.4 seconds (ref 11.6–15.2)

## 2015-01-03 LAB — APTT: aPTT: 29 seconds (ref 24–37)

## 2015-01-03 LAB — SURGICAL PCR SCREEN
MRSA, PCR: NEGATIVE
Staphylococcus aureus: POSITIVE — AB

## 2015-01-03 NOTE — Progress Notes (Signed)
CBC / CMET done 12/21/14 with medical clearance on chart

## 2015-01-04 ENCOUNTER — Ambulatory Visit: Payer: Self-pay | Admitting: Orthopedic Surgery

## 2015-01-04 NOTE — H&P (Signed)
Erin Carroll DOB: 25-May-1946 Married / Language: English / Race: White Female Date of Admission:  01/10/2015 CC:  Left Knee Pain History of Present Illness  The patient is a 69 year old female who comes in  for a preoperative History and Physical. The patient is scheduled for a left total knee arthroplasty to be performed by Dr. Dione Plover. Aluisio, MD on 01-10-2015. Knee pain. She has not done well with cortisone or viscosupplements. The patient has not gotten any relief of their symptoms with viscosupplementation. She has near bone on bone arthritis. Unfortunately, she has had progressively worsening pain and dysfunction. At this point the most predictable means of improving pain and function is going to be total knee arthroplasty. She is definitely in favor of doing this because the knee is having a very negative effect on her lifestyle. We will get her set up for surgical treatment. At this point, the most predictable means of improving pain and function is total knee arthroplasty. The procedure, risks, potential complications and rehab course are discussed in detail and the patient elects to proceed. They have been treated conservatively in the past for the above stated problem and despite conservative measures, they continue to have progressive pain and severe functional limitations and dysfunction. They have failed non-operative management including home exercise, medications, and injections. It is felt that they would benefit from undergoing total joint replacement. Risks and benefits of the procedure have been discussed with the patient and they elect to proceed with surgery. There are no active contraindications to surgery such as ongoing infection or rapidly progressive neurological disease.  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 mother Hypertension mother and father Heart Disease father  Social History Alcohol use former drinker Tobacco use Former smoker. quit in 1989 former smoker Copy of Drug/Alcohol Rehab (Previously) yes Exercise Exercises daily; does running / walking and gym / weights Current work status working full time Drug/Alcohol Rehab (Currently) no Pain Contract no Living situation live with spouse Marital status married Tobacco / smoke exposure no Number of flights of stairs before winded 2-3 Advance Directives Living Will, Healthcare POA  Medication History  CeleBREX (200MG  Capsule, 1 (one) Capsule Oral daily, Taken starting 11/04/2014) Active. (with food) Biotin 5000 (5MG  Capsule, Oral) Active. Multivitamins (Oral) Active. Aspirin EC (81MG  Tablet DR, Oral) Active. Synthroid (75MCG Tablet, Oral) Active.  Past Surgical History Tonsillectomy Total Knee Replacement right Arthroscopic Knee Surgery - Right Date: 2011. Bilateral CMC Joint Repairs Date: 2013. Left Hip Tendon Repair Date: 01/2014. Repair Rectal Prolaspe Date: 04/2014.  Review of Systems General Not Present- Chills, Fatigue, Fever, Memory Loss, Night Sweats, Weight Gain and Weight Loss. Skin Not Present- Eczema, Hives, Itching, Lesions and Rash. HEENT Present- Hearing Loss. Not Present- Dentures, Double Vision, Headache, Tinnitus and Visual Loss. Respiratory Not Present- Allergies, Chronic Cough, Coughing up blood, Shortness of breath at rest and Shortness of breath with  exertion. Cardiovascular Not Present- Chest Pain, Difficulty Breathing Lying Down, Murmur, Palpitations, Racing/skipping heartbeats and Swelling. Gastrointestinal Not Present- Abdominal Pain, Bloody Stool, Constipation, Diarrhea, Difficulty Swallowing, Heartburn, Jaundice, Loss of  appetitie, Nausea and Vomiting. Female Genitourinary Not Present- Blood in Urine, Discharge, Flank Pain, Incontinence, Painful Urination, Urgency, Urinary frequency, Urinary Retention, Urinating at Night and Weak urinary stream. Musculoskeletal Present- Joint Pain, Joint Swelling and Muscle Weakness. Not Present- Back Pain, Morning Stiffness, Muscle Pain and Spasms. Neurological Not Present- Blackout spells, Difficulty with balance, Dizziness, Paralysis, Tremor and Weakness. Psychiatric Not Present- Insomnia.  Vitals  Weight: 112 lb Height: 62in Weight was reported by patient. Height was reported by patient. Body Surface Area: 1.49 m Body Mass Index: 20.48 kg/m  BP: 118/68 (Sitting, Right Arm, Standard)   Physical Exam  General Mental Status -Alert, cooperative and good historian. General Appearance-pleasant, Not in acute distress. Orientation-Oriented X3. Build & Nutrition-Well nourished and Well developed.  Head and Neck Head-normocephalic, atraumatic . Neck Global Assessment - supple, no bruit auscultated on the right, no bruit auscultated on the left. Note: hearing aids   Eye Vision-Wears contact lenses. Pupil - Bilateral-Regular and Round. Motion - Bilateral-EOMI.  Chest and Lung Exam Auscultation Breath sounds - clear at anterior chest wall and clear at posterior chest wall. Adventitious sounds - No Adventitious sounds.  Cardiovascular Auscultation Rhythm - Regular rate and rhythm. Heart Sounds - S1 WNL and S2 WNL. Murmurs & Other Heart Sounds - Auscultation of the heart reveals - No Murmurs.  Abdomen Palpation/Percussion Tenderness - Abdomen is non-tender to  palpation. Rigidity (guarding) - Abdomen is soft. Auscultation Auscultation of the abdomen reveals - Bowel sounds normal.  Musculoskeletal Note: Well developed female in no distress. Left knee shows slight valgus deformity. Range is 5-130. Moderate crepitus on range of motion. Tender lateral greater than medial with no instability noted.  RADIOGRAPHS: Radiographs from earlier in the year. She is essentially bone on bone in the lateral compartment on the left with patellofemoral involvement also. Assessment & Plan Primary osteoarthritis of knee, left  Note:Plan is for a Left Total Knee Replacement by Dr. Wynelle Link.  Plan is to go home.  PCP - Anselm Pancoast, PA-C - Patient has been seen preoperatively and felt to be stable for surgery. [place], [place] [address] FAX [phone]  The patient does not have any contraindications and will receive TXA (tranexamic acid) prior to surgery.  Signed electronically by Joelene Millin, III PA-C

## 2015-01-10 ENCOUNTER — Encounter (HOSPITAL_COMMUNITY)
Admission: RE | Disposition: A | Discharge status: Home / Self Care | Payer: Self-pay | Source: Ambulatory Visit | Attending: Orthopedic Surgery

## 2015-01-10 ENCOUNTER — Encounter (HOSPITAL_COMMUNITY): Payer: Self-pay | Admitting: *Deleted

## 2015-01-10 ENCOUNTER — Inpatient Hospital Stay (HOSPITAL_COMMUNITY): Payer: Medicare Other | Admitting: Anesthesiology

## 2015-01-10 ENCOUNTER — Inpatient Hospital Stay (HOSPITAL_COMMUNITY)
Admission: RE | Admit: 2015-01-10 | Discharge: 2015-01-12 | DRG: 470 | Disposition: A | Discharge status: Home / Self Care | Payer: Medicare Other | Source: Ambulatory Visit | Attending: Orthopedic Surgery | Admitting: Orthopedic Surgery

## 2015-01-10 DIAGNOSIS — E039 Hypothyroidism, unspecified: Secondary | ICD-10-CM | POA: Diagnosis present

## 2015-01-10 DIAGNOSIS — M19042 Primary osteoarthritis, left hand: Secondary | ICD-10-CM | POA: Diagnosis present

## 2015-01-10 DIAGNOSIS — M1710 Unilateral primary osteoarthritis, unspecified knee: Secondary | ICD-10-CM | POA: Diagnosis present

## 2015-01-10 DIAGNOSIS — Z87891 Personal history of nicotine dependence: Secondary | ICD-10-CM

## 2015-01-10 DIAGNOSIS — M1712 Unilateral primary osteoarthritis, left knee: Secondary | ICD-10-CM | POA: Diagnosis present

## 2015-01-10 DIAGNOSIS — M179 Osteoarthritis of knee, unspecified: Secondary | ICD-10-CM | POA: Insufficient documentation

## 2015-01-10 DIAGNOSIS — Z96651 Presence of right artificial knee joint: Secondary | ICD-10-CM | POA: Diagnosis present

## 2015-01-10 DIAGNOSIS — Z7982 Long term (current) use of aspirin: Secondary | ICD-10-CM

## 2015-01-10 DIAGNOSIS — M25562 Pain in left knee: Secondary | ICD-10-CM | POA: Diagnosis present

## 2015-01-10 HISTORY — PX: TOTAL KNEE ARTHROPLASTY: SHX125

## 2015-01-10 LAB — TYPE AND SCREEN
ABO/RH(D): O POS
Antibody Screen: NEGATIVE

## 2015-01-10 LAB — ABO/RH: ABO/RH(D): O POS

## 2015-01-10 SURGERY — ARTHROPLASTY, KNEE, TOTAL
Anesthesia: Spinal | Site: Knee | Laterality: Left

## 2015-01-10 MED ORDER — BUPIVACAINE IN DEXTROSE 0.75-8.25 % IT SOLN
INTRATHECAL | Status: DC | PRN
  Administered 2015-01-10: 2 mL via INTRATHECAL

## 2015-01-10 MED ORDER — CEFAZOLIN SODIUM-DEXTROSE 2-3 GM-% IV SOLR
INTRAVENOUS | Status: AC
  Filled 2015-01-10: qty 50

## 2015-01-10 MED ORDER — EPHEDRINE SULFATE 50 MG/ML IJ SOLN
INTRAMUSCULAR | Status: DC | PRN
  Administered 2015-01-10 (×2): 7.5 mg via INTRAVENOUS

## 2015-01-10 MED ORDER — MIDAZOLAM HCL 2 MG/2ML IJ SOLN
INTRAMUSCULAR | Status: AC
  Filled 2015-01-10: qty 2

## 2015-01-10 MED ORDER — EPHEDRINE SULFATE 50 MG/ML IJ SOLN
INTRAMUSCULAR | Status: AC
  Filled 2015-01-10: qty 1

## 2015-01-10 MED ORDER — BUPIVACAINE LIPOSOME 1.3 % IJ SUSP
20.0000 mL | Freq: Once | INTRAMUSCULAR | Status: DC
  Filled 2015-01-10: qty 20

## 2015-01-10 MED ORDER — CEFAZOLIN SODIUM-DEXTROSE 2-3 GM-% IV SOLR
2.0000 g | INTRAVENOUS | Status: AC
  Administered 2015-01-10: 2 g via INTRAVENOUS

## 2015-01-10 MED ORDER — CHLORHEXIDINE GLUCONATE 4 % EX LIQD: 60.0000 mL | Freq: Once | CUTANEOUS | Status: DC

## 2015-01-10 MED ORDER — TRANEXAMIC ACID 100 MG/ML IV SOLN
1000.0000 mg | INTRAVENOUS | Status: AC
  Administered 2015-01-10: 1000 mg via INTRAVENOUS
  Filled 2015-01-10: qty 10

## 2015-01-10 MED ORDER — LEVOTHYROXINE SODIUM 75 MCG PO TABS
75.0000 ug | ORAL_TABLET | Freq: Every day | ORAL | Status: DC
  Administered 2015-01-11 – 2015-01-12 (×2): 75 ug via ORAL
  Filled 2015-01-10 (×3): qty 1

## 2015-01-10 MED ORDER — ACETAMINOPHEN 500 MG PO TABS
1000.0000 mg | ORAL_TABLET | Freq: Four times a day (QID) | ORAL | Status: AC
  Administered 2015-01-10 – 2015-01-11 (×3): 1000 mg via ORAL
  Filled 2015-01-10 (×3): qty 2

## 2015-01-10 MED ORDER — SODIUM CHLORIDE 0.9 % IV SOLN: INTRAVENOUS | Status: DC

## 2015-01-10 MED ORDER — SODIUM CHLORIDE 0.9 % IJ SOLN
INTRAMUSCULAR | Status: AC
  Filled 2015-01-10: qty 50

## 2015-01-10 MED ORDER — MIDAZOLAM HCL 5 MG/5ML IJ SOLN
INTRAMUSCULAR | Status: DC | PRN
  Administered 2015-01-10: 2 mg via INTRAVENOUS

## 2015-01-10 MED ORDER — ACETAMINOPHEN 325 MG PO TABS: 650.0000 mg | ORAL_TABLET | Freq: Four times a day (QID) | ORAL | Status: DC | PRN

## 2015-01-10 MED ORDER — FLEET ENEMA 7-19 GM/118ML RE ENEM: 1.0000 | ENEMA | Freq: Once | RECTAL | Status: AC | PRN

## 2015-01-10 MED ORDER — BUPIVACAINE LIPOSOME 1.3 % IJ SUSP
INTRAMUSCULAR | Status: DC | PRN
  Administered 2015-01-10: 20 mL

## 2015-01-10 MED ORDER — BUPIVACAINE HCL (PF) 0.25 % IJ SOLN
INTRAMUSCULAR | Status: AC
  Filled 2015-01-10: qty 30

## 2015-01-10 MED ORDER — TRAMADOL HCL 50 MG PO TABS
50.0000 mg | ORAL_TABLET | Freq: Four times a day (QID) | ORAL | Status: DC | PRN
  Administered 2015-01-11: 100 mg via ORAL
  Filled 2015-01-10: qty 2

## 2015-01-10 MED ORDER — ONDANSETRON HCL 4 MG/2ML IJ SOLN
4.0000 mg | Freq: Four times a day (QID) | INTRAMUSCULAR | Status: DC | PRN
Start: 2015-01-10 — End: 2015-01-12

## 2015-01-10 MED ORDER — ONDANSETRON HCL 4 MG PO TABS: 4.0000 mg | ORAL_TABLET | Freq: Four times a day (QID) | ORAL | Status: DC | PRN

## 2015-01-10 MED ORDER — MENTHOL 3 MG MT LOZG
1.0000 | LOZENGE | OROMUCOSAL | Status: DC | PRN
  Filled 2015-01-10: qty 9

## 2015-01-10 MED ORDER — BUPIVACAINE HCL 0.25 % IJ SOLN
INTRAMUSCULAR | Status: DC | PRN
  Administered 2015-01-10: 20 mL

## 2015-01-10 MED ORDER — HYDROMORPHONE HCL 1 MG/ML IJ SOLN: 0.2500 mg | INTRAMUSCULAR | Status: DC | PRN

## 2015-01-10 MED ORDER — PROPOFOL 10 MG/ML IV BOLUS
INTRAVENOUS | Status: AC
  Filled 2015-01-10: qty 20

## 2015-01-10 MED ORDER — METHOCARBAMOL 1000 MG/10ML IJ SOLN
500.0000 mg | Freq: Four times a day (QID) | INTRAVENOUS | Status: DC | PRN
  Filled 2015-01-10: qty 5

## 2015-01-10 MED ORDER — SODIUM CHLORIDE 0.9 % IV SOLN
INTRAVENOUS | Status: DC
  Administered 2015-01-10: 15:00:00 via INTRAVENOUS

## 2015-01-10 MED ORDER — PHENOL 1.4 % MT LIQD: 1.0000 | OROMUCOSAL | Status: DC | PRN

## 2015-01-10 MED ORDER — DEXAMETHASONE SODIUM PHOSPHATE 10 MG/ML IJ SOLN
INTRAMUSCULAR | Status: AC
  Filled 2015-01-10: qty 1

## 2015-01-10 MED ORDER — POLYETHYLENE GLYCOL 3350 17 G PO PACK: 17.0000 g | PACK | Freq: Every day | ORAL | Status: DC | PRN

## 2015-01-10 MED ORDER — PROPOFOL 10 MG/ML IV BOLUS
INTRAVENOUS | Status: DC | PRN
  Administered 2015-01-10: 30 mg via INTRAVENOUS

## 2015-01-10 MED ORDER — CEFAZOLIN SODIUM-DEXTROSE 2-3 GM-% IV SOLR
2.0000 g | Freq: Four times a day (QID) | INTRAVENOUS | Status: AC
  Administered 2015-01-10 (×2): 2 g via INTRAVENOUS
  Filled 2015-01-10 (×2): qty 50

## 2015-01-10 MED ORDER — DEXAMETHASONE SODIUM PHOSPHATE 10 MG/ML IJ SOLN
10.0000 mg | Freq: Once | INTRAMUSCULAR | Status: AC
  Administered 2015-01-11: 10 mg via INTRAVENOUS
  Filled 2015-01-10: qty 1

## 2015-01-10 MED ORDER — METHOCARBAMOL 500 MG PO TABS
500.0000 mg | ORAL_TABLET | Freq: Four times a day (QID) | ORAL | Status: DC | PRN
  Administered 2015-01-10 – 2015-01-12 (×5): 500 mg via ORAL
  Filled 2015-01-10 (×5): qty 1

## 2015-01-10 MED ORDER — DIPHENHYDRAMINE HCL 12.5 MG/5ML PO ELIX: 12.5000 mg | ORAL_SOLUTION | ORAL | Status: DC | PRN

## 2015-01-10 MED ORDER — RIVAROXABAN 10 MG PO TABS
10.0000 mg | ORAL_TABLET | Freq: Every day | ORAL | Status: DC
  Administered 2015-01-11 – 2015-01-12 (×2): 10 mg via ORAL
  Filled 2015-01-10 (×3): qty 1

## 2015-01-10 MED ORDER — METOCLOPRAMIDE HCL 5 MG/ML IJ SOLN: 5.0000 mg | Freq: Three times a day (TID) | INTRAMUSCULAR | Status: DC | PRN

## 2015-01-10 MED ORDER — SODIUM CHLORIDE 0.9 % IJ SOLN
INTRAMUSCULAR | Status: AC
  Filled 2015-01-10: qty 10

## 2015-01-10 MED ORDER — MORPHINE SULFATE 10 MG/ML IJ SOLN
1.0000 mg | INTRAMUSCULAR | Status: DC | PRN
  Administered 2015-01-10: 2 mg via INTRAVENOUS
  Administered 2015-01-10: 1 mg via INTRAVENOUS
  Filled 2015-01-10 (×2): qty 1

## 2015-01-10 MED ORDER — OXYCODONE HCL 5 MG PO TABS
5.0000 mg | ORAL_TABLET | ORAL | Status: DC | PRN
  Administered 2015-01-10: 5 mg via ORAL
  Administered 2015-01-10 – 2015-01-12 (×11): 10 mg via ORAL
  Filled 2015-01-10 (×2): qty 2
  Filled 2015-01-10: qty 1
  Filled 2015-01-10 (×9): qty 2

## 2015-01-10 MED ORDER — DOCUSATE SODIUM 100 MG PO CAPS
100.0000 mg | ORAL_CAPSULE | Freq: Two times a day (BID) | ORAL | Status: DC
  Administered 2015-01-10 – 2015-01-12 (×4): 100 mg via ORAL

## 2015-01-10 MED ORDER — LACTATED RINGERS IV SOLN
INTRAVENOUS | Status: DC
  Administered 2015-01-10: 1000 mL via INTRAVENOUS
  Administered 2015-01-10 (×3): via INTRAVENOUS

## 2015-01-10 MED ORDER — PROMETHAZINE HCL 25 MG/ML IJ SOLN: 6.2500 mg | INTRAMUSCULAR | Status: DC | PRN

## 2015-01-10 MED ORDER — SODIUM CHLORIDE 0.9 % IJ SOLN
INTRAMUSCULAR | Status: DC | PRN
  Administered 2015-01-10: 30 mL

## 2015-01-10 MED ORDER — FENTANYL CITRATE 0.05 MG/ML IJ SOLN
INTRAMUSCULAR | Status: AC
Start: 2015-01-10 — End: 2015-01-10
  Filled 2015-01-10: qty 2

## 2015-01-10 MED ORDER — PROPOFOL INFUSION 10 MG/ML OPTIME
INTRAVENOUS | Status: DC | PRN
  Administered 2015-01-10: 120 ug/kg/min via INTRAVENOUS

## 2015-01-10 MED ORDER — KETOROLAC TROMETHAMINE 15 MG/ML IJ SOLN: 7.5000 mg | Freq: Four times a day (QID) | INTRAMUSCULAR | Status: AC | PRN

## 2015-01-10 MED ORDER — ACETAMINOPHEN 650 MG RE SUPP: 650.0000 mg | Freq: Four times a day (QID) | RECTAL | Status: DC | PRN

## 2015-01-10 MED ORDER — ACETAMINOPHEN 10 MG/ML IV SOLN
1000.0000 mg | Freq: Once | INTRAVENOUS | Status: AC
  Administered 2015-01-10: 1000 mg via INTRAVENOUS
  Filled 2015-01-10: qty 100

## 2015-01-10 MED ORDER — DEXAMETHASONE SODIUM PHOSPHATE 10 MG/ML IJ SOLN
10.0000 mg | Freq: Once | INTRAMUSCULAR | Status: AC
  Administered 2015-01-10: 10 mg via INTRAVENOUS

## 2015-01-10 MED ORDER — MORPHINE SULFATE 2 MG/ML IJ SOLN
INTRAMUSCULAR | Status: AC
  Filled 2015-01-10: qty 1

## 2015-01-10 MED ORDER — FENTANYL CITRATE 0.05 MG/ML IJ SOLN
INTRAMUSCULAR | Status: DC | PRN
  Administered 2015-01-10: 50 ug via INTRAVENOUS

## 2015-01-10 MED ORDER — METOCLOPRAMIDE HCL 5 MG PO TABS
5.0000 mg | ORAL_TABLET | Freq: Three times a day (TID) | ORAL | Status: DC | PRN
  Filled 2015-01-10: qty 2

## 2015-01-10 MED ORDER — BISACODYL 10 MG RE SUPP: 10.0000 mg | Freq: Every day | RECTAL | Status: DC | PRN

## 2015-01-10 SURGICAL SUPPLY — 61 items
BAG SPEC THK2 15X12 ZIP CLS (MISCELLANEOUS) ×1
BAG ZIPLOCK 12X15 (MISCELLANEOUS) ×2 IMPLANT
BANDAGE ELASTIC 6 VELCRO ST LF (GAUZE/BANDAGES/DRESSINGS) ×2 IMPLANT
BANDAGE ESMARK 6X9 LF (GAUZE/BANDAGES/DRESSINGS) ×1 IMPLANT
BLADE SAG 18X100X1.27 (BLADE) ×2 IMPLANT
BLADE SAW SGTL 11.0X1.19X90.0M (BLADE) ×2 IMPLANT
BNDG CMPR 9X6 STRL LF SNTH (GAUZE/BANDAGES/DRESSINGS) ×1
BNDG ESMARK 6X9 LF (GAUZE/BANDAGES/DRESSINGS) ×2
BOWL SMART MIX CTS (DISPOSABLE) ×2 IMPLANT
CAPT KNEE TOTAL 3 ATTUNE ×1 IMPLANT
CEMENT HV SMART SET (Cement) ×4 IMPLANT
CUFF TOURN SGL QUICK 34 (TOURNIQUET CUFF) ×2
CUFF TRNQT CYL 34X4X40X1 (TOURNIQUET CUFF) ×1 IMPLANT
DECANTER SPIKE VIAL GLASS SM (MISCELLANEOUS) ×2 IMPLANT
DRAPE EXTREMITY T 121X128X90 (DRAPE) ×2 IMPLANT
DRAPE POUCH INSTRU U-SHP 10X18 (DRAPES) ×2 IMPLANT
DRAPE U-SHAPE 47X51 STRL (DRAPES) ×2 IMPLANT
DRSG ADAPTIC 3X8 NADH LF (GAUZE/BANDAGES/DRESSINGS) ×2 IMPLANT
DRSG PAD ABDOMINAL 8X10 ST (GAUZE/BANDAGES/DRESSINGS) ×2 IMPLANT
DURAPREP 26ML APPLICATOR (WOUND CARE) ×2 IMPLANT
ELECT REM PT RETURN 9FT ADLT (ELECTROSURGICAL) ×2
ELECTRODE REM PT RTRN 9FT ADLT (ELECTROSURGICAL) ×1 IMPLANT
EVACUATOR 1/8 PVC DRAIN (DRAIN) ×2 IMPLANT
FACESHIELD WRAPAROUND (MASK) ×10 IMPLANT
FACESHIELD WRAPAROUND OR TEAM (MASK) ×5 IMPLANT
GAUZE SPONGE 4X4 12PLY STRL (GAUZE/BANDAGES/DRESSINGS) ×2 IMPLANT
GLOVE BIO SURGEON STRL SZ7.5 (GLOVE) ×1 IMPLANT
GLOVE BIO SURGEON STRL SZ8 (GLOVE) ×2 IMPLANT
GLOVE BIOGEL PI IND STRL 6.5 (GLOVE) IMPLANT
GLOVE BIOGEL PI IND STRL 8 (GLOVE) ×1 IMPLANT
GLOVE BIOGEL PI INDICATOR 6.5 (GLOVE)
GLOVE BIOGEL PI INDICATOR 8 (GLOVE) ×1
GLOVE SURG SS PI 6.5 STRL IVOR (GLOVE) IMPLANT
GOWN STRL REUS W/TWL LRG LVL3 (GOWN DISPOSABLE) ×2 IMPLANT
GOWN STRL REUS W/TWL XL LVL3 (GOWN DISPOSABLE) IMPLANT
HANDPIECE INTERPULSE COAX TIP (DISPOSABLE) ×2
IMMOBILIZER KNEE 20 (SOFTGOODS) ×3 IMPLANT
IMMOBILIZER KNEE 20 THIGH 36 (SOFTGOODS) ×1 IMPLANT
KIT BASIN OR (CUSTOM PROCEDURE TRAY) ×2 IMPLANT
MANIFOLD NEPTUNE II (INSTRUMENTS) ×2 IMPLANT
NDL SAFETY ECLIPSE 18X1.5 (NEEDLE) ×2 IMPLANT
NEEDLE HYPO 18GX1.5 SHARP (NEEDLE) ×4
NS IRRIG 1000ML POUR BTL (IV SOLUTION) ×2 IMPLANT
PACK TOTAL JOINT (CUSTOM PROCEDURE TRAY) ×2 IMPLANT
PAD ABD 8X10 STRL (GAUZE/BANDAGES/DRESSINGS) ×1 IMPLANT
PADDING CAST COTTON 6X4 STRL (CAST SUPPLIES) ×5 IMPLANT
POSITIONER SURGICAL ARM (MISCELLANEOUS) ×2 IMPLANT
SET HNDPC FAN SPRY TIP SCT (DISPOSABLE) ×1 IMPLANT
STRIP CLOSURE SKIN 1/2X4 (GAUZE/BANDAGES/DRESSINGS) ×4 IMPLANT
SUCTION FRAZIER 12FR DISP (SUCTIONS) ×2 IMPLANT
SUT MNCRL AB 4-0 PS2 18 (SUTURE) ×2 IMPLANT
SUT VIC AB 2-0 CT1 27 (SUTURE) ×6
SUT VIC AB 2-0 CT1 TAPERPNT 27 (SUTURE) ×3 IMPLANT
SUT VLOC 180 0 24IN GS25 (SUTURE) ×2 IMPLANT
SYR 20CC LL (SYRINGE) ×2 IMPLANT
SYR 50ML LL SCALE MARK (SYRINGE) ×2 IMPLANT
TOWEL OR 17X26 10 PK STRL BLUE (TOWEL DISPOSABLE) ×2 IMPLANT
TOWEL OR NON WOVEN STRL DISP B (DISPOSABLE) IMPLANT
TRAY FOLEY CATH 14FRSI W/METER (CATHETERS) ×2 IMPLANT
WATER STERILE IRR 1500ML POUR (IV SOLUTION) ×2 IMPLANT
WRAP KNEE MAXI GEL POST OP (GAUZE/BANDAGES/DRESSINGS) ×2 IMPLANT

## 2015-01-10 NOTE — Interval H&P Note (Signed)
History and Physical Interval Note:  01/10/2015 8:28 AM  Erin Carroll  has presented today for surgery, with the diagnosis of OA LEFT KNEE  The various methods of treatment have been discussed with the patient and family. After consideration of risks, benefits and other options for treatment, the patient has consented to  Procedure(s): LEFT TOTAL KNEE ARTHROPLASTY (Left) as a surgical intervention .  The patient's history has been reviewed, patient examined, no change in status, stable for surgery.  I have reviewed the patient's chart and labs.  Questions were answered to the patient's satisfaction.     [name]

## 2015-01-10 NOTE — Anesthesia Procedure Notes (Signed)
Spinal Patient location during procedure: OR Staffing Anesthesiologist: Salley Scarlet Performed by: anesthesiologist  Preanesthetic Checklist Completed: patient identified, site marked, surgical consent, pre-op evaluation, timeout performed, IV checked, risks and benefits discussed and monitors and equipment checked Spinal Block Patient position: sitting Prep: Betadine Patient monitoring: heart rate, continuous pulse ox and blood pressure Approach: midline Location: L3-4 Injection technique: single-shot Needle Needle type: Spinocan  Needle gauge: 22 G Needle length: 9 cm Additional Notes Expiration date of kit checked and confirmed. Patient tolerated procedure well, without complications. CSF clear. No paresthesia. Meaningful verbal contact maintained throughout spinal placement.

## 2015-01-10 NOTE — Anesthesia Postprocedure Evaluation (Signed)
  Anesthesia Post-op Note  Patient: Erin Carroll  Procedure(s) Performed: Procedure(s) (LRB): LEFT TOTAL KNEE ARTHROPLASTY (Left)  Patient Location: PACU  Anesthesia Type: Spinal  Level of Consciousness: awake and alert   Airway and Oxygen Therapy: Patient Spontanous Breathing  Post-op Pain: mild  Post-op Assessment: Post-op Vital signs reviewed, Patient's Cardiovascular Status Stable, Respiratory Function Stable, Patent Airway and No signs of Nausea or vomiting  Last Vitals:  Filed Vitals:   01/10/15 1450  BP: 125/77  Pulse: 65  Temp: 36.6 C  Resp: 15    Post-op Vital Signs: stable   Complications: No apparent anesthesia complications

## 2015-01-10 NOTE — Progress Notes (Signed)
Utilization review completed.  

## 2015-01-10 NOTE — Op Note (Signed)
Pre-operative diagnosis- Osteoarthritis Left knee(s)  Post-operative diagnosis- Osteoarthritis  Left knee(s)  Procedure-   Left Total Knee Arthroplasty  Surgeon- Dione Plover. Miryam Mcelhinney, MD  Assistant- Arlee Muslim, PA-C   Anesthesia-  Spinal   EBL- * No blood loss amount entered *   Drains Hemovac   Tourniquet time  Total Tourniquet Time Documented: Thigh (Left) - 30 minutes Total: Thigh (Left) - 30 minutes    Complications- None  Condition-PACU - hemodynamically stable.   Brief Clinical Note  Erin Carroll is a 69 y.o. year old female with end stage OA of her left knee with progressively worsening pain and dysfunction. She has constant pain, with activity and at rest and significant functional deficits with difficulties even with ADLs. She has had extensive non-op management including analgesics, injections of cortisone and viscosupplements, and home exercise program, but remains in significant pain with significant dysfunction. Radiographs show bone on bone arthritis lateral and patellofemoral with valgus deformity. She presents now for left Total Knee Arthroplasty.    Procedure in detail---       The patient is brought into the operating room and positioned supine on the operating table. After successful administration of Spinal anesthetic, a tourniquet is placed high on the Left thigh(s) and the lower extremity is prepped and draped in the usual sterile fashion. Time out is performed by the operating team and then the Left  lower extremity is wrapped in Esmarch, knee flexed and the tourniquet inflated to 300 mmHg.       A midline incision is made with a ten blade through the subcutaneous tissue to the level of the extensor mechanism. A fresh blade is used to make a lateral parapatellar arthrotomy due to the patients' valgus deformity. Soft tissue over the proximal lateral tibia is subperiosteally elevated to the joint line with a knife to the posterolateral corner but not including  the structures of the posterolateral corner. Soft tissue over the proximal medial tibia is elevated with attention being paid to avoiding the patellar tendon on the tibial tubercle. The patella is everted medially, knee flexed 90 degrees and the ACL and PCL are removed. Findings are bone on bone lateral and patellofemoral with large global osteophytes. .       The drill is used to create a starting hole in the distal femur and the canal is thoroughly irrigated with sterile saline to remove the fatty contents. The 5 degree Left  valgus alignment guide is placed into the femoral canal and the distal femoral cutting block is pinned to remove 10  mm off the distal femur. Resection is made with an oscillating saw.      The tibia is subluxed forward and the menisci are removed. The extramedullary alignment guide is placed referencing proximally at the medial aspect of the tibial tubercle and distally along the second metatarsal axis and tibial crest. The block is pinned to remove 71mm off the more deficient lateral side. Resection is made with an oscillating saw. Size 4  is the most appropriate size for the tibia and the proximal tibia is prepared with the modular drill and keel punch for that size.      The femoral sizing guide is placed and size 5  is most appropriate. Rotation is marked off the epicondylar axis and confirmed by creating a rectangular flexion gap at 90 degrees. The size 5  cutting block is pinned in this rotation and the anterior, posterior and chamfer cuts are made with the oscillating saw.  The intercondylar block is then placed and that cut is made.      Trial size 4  tibial component, trial size 5  posterior stabilized femur and a 6  mm posterior stabilized rotating platform insert trial is placed. Full extension is achieved with excellent varus/valgus and   anterior/posterior balance throughout full range of motion. The patella is everted and thickness measured to be 22  mm. Free hand resection  is taken to 12 mm, a 35 template is placed, lug holes are drilled, trial patella is placed, and it tracks normally. Osteophytes are removed off the posterior femur with the trial in place. All trials are removed and the cut bone surfaces prepared with pulsatile lavage. Cement is mixed and once ready for implantation, the size 4  tibial implant, size 5 narrow posterior stabilized femoral component, and the size 35  patella are cemented in place and the patella is held with the clamp. The trial insert is placed and the knee held in full extension. The Exparel (20 ml mixed with 30 ml saline) and then 20 ml of .25% Bupivicaine is injected into the extensor mechanism, posterior capsule, medial and lateral gutters and subcutaneous tissues. All extruded cement is removed and once the cement is hard the permanent 6  mm posterior stabilized rotating platform insert is placed into the tibial tray.      The wound is copiously irrigated with saline solution and the tourniquet is released for a total   tourniquet time of 29  minutes. Bleeding is identified and controlled with electrocautery. The extensor mechanism is closed with interrupted #1 V-loc leaving open a small area from the superior to inferior pole of the patella to serve as a mini lateral release. Flexion against gravity is 140  degrees and the patella tracks normally. Subcutaneous tissue is closed with 2.0 vicryl and subcuticular with running 4.0 Monocryl.The incision is cleaned and dried and steri-strips and a bulky sterile dressing are applied. The limb is placed into a knee immobilizer and the patient is awakened and transported to recovery in stable condition.      Please note that a surgical assistant was a medical necessity for this procedure in order to perform it in a safe and expeditious manner. Surgical assistant was necessary to retract the ligaments and vital neurovascular structures to prevent injury to them and also necessary for proper positioning  of the limb to allow for anatomic placement of the prosthesis.    Dione Plover Truc Winfree, MD    01/10/2015, 11:26 AM

## 2015-01-10 NOTE — H&P (View-Only) (Signed)
Erin Carroll DOB: 05/25/1946 Married / Language: English / Race: White Female Date of Admission:  01/10/2015 CC:  Left Knee Pain History of Present Illness  The patient is a 69 year old female who comes in  for a preoperative History and Physical. The patient is scheduled for a left total knee arthroplasty to be performed by Dr. Dione Plover. Aluisio, MD on 01-10-2015. Knee pain. She has not done well with cortisone or viscosupplements. The patient has not gotten any relief of their symptoms with viscosupplementation. She has near bone on bone arthritis. Unfortunately, she has had progressively worsening pain and dysfunction. At this point the most predictable means of improving pain and function is going to be total knee arthroplasty. She is definitely in favor of doing this because the knee is having a very negative effect on her lifestyle. We will get her set up for surgical treatment. At this point, the most predictable means of improving pain and function is total knee arthroplasty. The procedure, risks, potential complications and rehab course are discussed in detail and the patient elects to proceed. They have been treated conservatively in the past for the above stated problem and despite conservative measures, they continue to have progressive pain and severe functional limitations and dysfunction. They have failed non-operative management including home exercise, medications, and injections. It is felt that they would benefit from undergoing total joint replacement. Risks and benefits of the procedure have been discussed with the patient and they elect to proceed with surgery. There are no active contraindications to surgery such as ongoing infection or rapidly progressive neurological disease.  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 mother Hypertension mother and father Heart Disease father  Social History Alcohol use former drinker Tobacco use Former smoker. quit in 1989 former smoker Copy of Drug/Alcohol Rehab (Previously) yes Exercise Exercises daily; does running / walking and gym / weights Current work status working full time Drug/Alcohol Rehab (Currently) no Pain Contract no Living situation live with spouse Marital status married Tobacco / smoke exposure no Number of flights of stairs before winded 2-3 Advance Directives Living Will, Healthcare POA  Medication History  CeleBREX (200MG  Capsule, 1 (one) Capsule Oral daily, Taken starting 11/04/2014) Active. (with food) Biotin 5000 (5MG  Capsule, Oral) Active. Multivitamins (Oral) Active. Aspirin EC (81MG  Tablet DR, Oral) Active. Synthroid (75MCG Tablet, Oral) Active.  Past Surgical History Tonsillectomy Total Knee Replacement right Arthroscopic Knee Surgery - Right Date: 2011. Bilateral CMC Joint Repairs Date: 2013. Left Hip Tendon Repair Date: 01/2014. Repair Rectal Prolaspe Date: 04/2014.  Review of Systems General Not Present- Chills, Fatigue, Fever, Memory Loss, Night Sweats, Weight Gain and Weight Loss. Skin Not Present- Eczema, Hives, Itching, Lesions and Rash. HEENT Present- Hearing Loss. Not Present- Dentures, Double Vision, Headache, Tinnitus and Visual Loss. Respiratory Not Present- Allergies, Chronic Cough, Coughing up blood, Shortness of breath at rest and Shortness of breath with  exertion. Cardiovascular Not Present- Chest Pain, Difficulty Breathing Lying Down, Murmur, Palpitations, Racing/skipping heartbeats and Swelling. Gastrointestinal Not Present- Abdominal Pain, Bloody Stool, Constipation, Diarrhea, Difficulty Swallowing, Heartburn, Jaundice, Loss of  appetitie, Nausea and Vomiting. Female Genitourinary Not Present- Blood in Urine, Discharge, Flank Pain, Incontinence, Painful Urination, Urgency, Urinary frequency, Urinary Retention, Urinating at Night and Weak urinary stream. Musculoskeletal Present- Joint Pain, Joint Swelling and Muscle Weakness. Not Present- Back Pain, Morning Stiffness, Muscle Pain and Spasms. Neurological Not Present- Blackout spells, Difficulty with balance, Dizziness, Paralysis, Tremor and Weakness. Psychiatric Not Present- Insomnia.  Vitals  Weight: 112 lb Height: 62in Weight was reported by patient. Height was reported by patient. Body Surface Area: 1.49 m Body Mass Index: 20.48 kg/m  BP: 118/68 (Sitting, Right Arm, Standard)   Physical Exam  General Mental Status -Alert, cooperative and good historian. General Appearance-pleasant, Not in acute distress. Orientation-Oriented X3. Build & Nutrition-Well nourished and Well developed.  Head and Neck Head-normocephalic, atraumatic . Neck Global Assessment - supple, no bruit auscultated on the right, no bruit auscultated on the left. Note: hearing aids   Eye Vision-Wears contact lenses. Pupil - Bilateral-Regular and Round. Motion - Bilateral-EOMI.  Chest and Lung Exam Auscultation Breath sounds - clear at anterior chest wall and clear at posterior chest wall. Adventitious sounds - No Adventitious sounds.  Cardiovascular Auscultation Rhythm - Regular rate and rhythm. Heart Sounds - S1 WNL and S2 WNL. Murmurs & Other Heart Sounds - Auscultation of the heart reveals - No Murmurs.  Abdomen Palpation/Percussion Tenderness - Abdomen is non-tender to  palpation. Rigidity (guarding) - Abdomen is soft. Auscultation Auscultation of the abdomen reveals - Bowel sounds normal.  Musculoskeletal Note: Well developed female in no distress. Left knee shows slight valgus deformity. Range is 5-130. Moderate crepitus on range of motion. Tender lateral greater than medial with no instability noted.  RADIOGRAPHS: Radiographs from earlier in the year. She is essentially bone on bone in the lateral compartment on the left with patellofemoral involvement also. Assessment & Plan Primary osteoarthritis of knee, left  Note:Plan is for a Left Total Knee Replacement by Dr. Wynelle Link.  Plan is to go home.  PCP - Anselm Pancoast, PA-C - Patient has been seen preoperatively and felt to be stable for surgery. [place] [place], [place] [address] FAX [phone]  The patient does not have any contraindications and will receive TXA (tranexamic acid) prior to surgery.  Signed electronically by Joelene Millin, III PA-C

## 2015-01-10 NOTE — Transfer of Care (Signed)
Immediate Anesthesia Transfer of Care Note  Patient: Erin Carroll  Procedure(s) Performed: Procedure(s): LEFT TOTAL KNEE ARTHROPLASTY (Left)  Patient Location: PACU  Anesthesia Type:Regional and Spinal  Level of Consciousness: awake, sedated and patient cooperative  Airway & Oxygen Therapy: Patient Spontanous Breathing and Patient connected to face mask oxygen  Post-op Assessment: Report given to RN and Post -op Vital signs reviewed and stable  Post vital signs: Reviewed and stable  Last Vitals:  Filed Vitals:   01/10/15 0729  BP: 118/74  Pulse: 71  Temp: 36.6 C  Resp: 18    Complications: No apparent anesthesia complications

## 2015-01-10 NOTE — Anesthesia Preprocedure Evaluation (Addendum)
Anesthesia Evaluation  Patient identified by MRN, date of birth, ID band Patient awake    Reviewed: Allergy & Precautions, NPO status , Patient's Chart, lab work & pertinent test results  Airway Mallampati: II  TM Distance: >3 FB Neck ROM: Full    Dental no notable dental hx.    Pulmonary former smoker,  breath sounds clear to auscultation  Pulmonary exam normal       Cardiovascular negative cardio ROS  Rhythm:Regular Rate:Normal     Neuro/Psych PSYCHIATRIC DISORDERS negative neurological ROS     GI/Hepatic negative GI ROS, Neg liver ROS,   Endo/Other  Hypothyroidism   Renal/GU negative Renal ROS  negative genitourinary   Musculoskeletal  (+) Arthritis -,   Abdominal   Peds negative pediatric ROS (+)  Hematology negative hematology ROS (+)   Anesthesia Other Findings   Reproductive/Obstetrics negative OB ROS                            Anesthesia Physical Anesthesia Plan  ASA: II  Anesthesia Plan: Spinal   Post-op Pain Management:    Induction: Intravenous  Airway Management Planned:   Additional Equipment:   Intra-op Plan:   Post-operative Plan:   Informed Consent: I have reviewed the patients History and Physical, chart, labs and discussed the procedure including the risks, benefits and alternatives for the proposed anesthesia with the patient or authorized representative who has indicated his/her understanding and acceptance.   Dental advisory given  Plan Discussed with: CRNA  Anesthesia Plan Comments: (Discussed spinal and general. Discussed risks/benefits of spinal including headache, backache, failure, bleeding, infection, and nerve damage. Patient consents to spinal. Questions answered. Coagulation studies and platelet count acceptable.)       Anesthesia Quick Evaluation

## 2015-01-11 ENCOUNTER — Encounter (HOSPITAL_COMMUNITY): Payer: Self-pay | Admitting: Orthopedic Surgery

## 2015-01-11 LAB — BASIC METABOLIC PANEL
ANION GAP: 7 (ref 5–15)
BUN: 12 mg/dL (ref 6–23)
CHLORIDE: 105 mmol/L (ref 96–112)
CO2: 26 mmol/L (ref 19–32)
Calcium: 8.3 mg/dL — ABNORMAL LOW (ref 8.4–10.5)
Creatinine, Ser: 0.68 mg/dL (ref 0.50–1.10)
GFR calc Af Amer: >90 mL/min (ref >90)
GFR calc non Af Amer: 89 mL/min — ABNORMAL LOW (ref >90)
GLUCOSE: 115 mg/dL — AB (ref 70–99)
POTASSIUM: 3.6 mmol/L (ref 3.5–5.1)
Sodium: 138 mmol/L (ref 135–145)

## 2015-01-11 LAB — CBC
HCT: 29.7 % — ABNORMAL LOW (ref 36.0–46.0)
HEMOGLOBIN: 9.7 g/dL — AB (ref 12.0–15.0)
MCH: 31.1 pg (ref 26.0–34.0)
MCHC: 32.7 g/dL (ref 30.0–36.0)
MCV: 95.2 fL (ref 78.0–100.0)
PLATELETS: 211 10*3/uL (ref 150–400)
RBC: 3.12 MIL/uL — ABNORMAL LOW (ref 3.87–5.11)
RDW: 14.5 % (ref 11.5–15.5)
WBC: 12.1 10*3/uL — AB (ref 4.0–10.5)

## 2015-01-11 MED ORDER — METHYLPREDNISOLONE 4 MG PO KIT
4.0000 mg | PACK | ORAL | Status: DC
Start: 2015-01-11 — End: 2015-01-12

## 2015-01-11 MED ORDER — TRAMADOL HCL 50 MG PO TABS: 50.0000 mg | ORAL_TABLET | Freq: Four times a day (QID) | ORAL | Status: DC | PRN

## 2015-01-11 MED ORDER — METHYLPREDNISOLONE 4 MG PO KIT: 8.0000 mg | PACK | Freq: Every evening | ORAL | Status: DC

## 2015-01-11 MED ORDER — METHYLPREDNISOLONE 4 MG PO KIT: 4.0000 mg | PACK | Freq: Four times a day (QID) | ORAL | Status: DC

## 2015-01-11 MED ORDER — OXYCODONE HCL 5 MG PO TABS: 5.0000 mg | ORAL_TABLET | ORAL | Status: DC | PRN

## 2015-01-11 MED ORDER — METHYLPREDNISOLONE 4 MG PO KIT
8.0000 mg | PACK | Freq: Every morning | ORAL | Status: AC
  Administered 2015-01-11: 8 mg via ORAL
  Filled 2015-01-11: qty 21

## 2015-01-11 MED ORDER — POLYSACCHARIDE IRON COMPLEX 150 MG PO CAPS
150.0000 mg | ORAL_CAPSULE | Freq: Every day | ORAL | Status: DC
Start: 2015-01-11 — End: 2015-01-12
  Administered 2015-01-11 – 2015-01-12 (×2): 150 mg via ORAL
  Filled 2015-01-11 (×2): qty 1

## 2015-01-11 MED ORDER — POLYSACCHARIDE IRON COMPLEX 150 MG PO CAPS: 150.0000 mg | ORAL_CAPSULE | Freq: Every day | ORAL | Status: DC

## 2015-01-11 MED ORDER — METHOCARBAMOL 500 MG PO TABS: 500.0000 mg | ORAL_TABLET | Freq: Four times a day (QID) | ORAL | Status: DC | PRN

## 2015-01-11 MED ORDER — METHYLPREDNISOLONE 4 MG PO KIT
4.0000 mg | PACK | ORAL | Status: AC
  Administered 2015-01-11: 4 mg via ORAL

## 2015-01-11 MED ORDER — POLYETHYLENE GLYCOL 3350 17 G PO PACK: 17.0000 g | PACK | Freq: Every day | ORAL | Status: DC | PRN

## 2015-01-11 MED ORDER — RIVAROXABAN 10 MG PO TABS: 10.0000 mg | ORAL_TABLET | Freq: Every day | ORAL | Status: DC

## 2015-01-11 MED ORDER — ONDANSETRON HCL 4 MG PO TABS: 4.0000 mg | ORAL_TABLET | Freq: Four times a day (QID) | ORAL | Status: DC | PRN

## 2015-01-11 MED ORDER — MORPHINE SULFATE 2 MG/ML IJ SOLN
INTRAMUSCULAR | Status: AC
  Administered 2015-01-11: 2 mg via INTRAVENOUS
  Filled 2015-01-11: qty 1

## 2015-01-11 MED ORDER — METHYLPREDNISOLONE 4 MG PO KIT
8.0000 mg | PACK | Freq: Every evening | ORAL | Status: AC
  Administered 2015-01-11: 8 mg via ORAL

## 2015-01-11 MED ORDER — METHYLPREDNISOLONE 4 MG PO KIT
4.0000 mg | PACK | Freq: Three times a day (TID) | ORAL | Status: DC
Start: 2015-01-12 — End: 2015-01-12
  Administered 2015-01-12: 20 mg via ORAL

## 2015-01-11 MED ORDER — METOCLOPRAMIDE HCL 5 MG PO TABS: 5.0000 mg | ORAL_TABLET | Freq: Three times a day (TID) | ORAL | Status: DC | PRN

## 2015-01-11 MED ORDER — METHYLPREDNISOLONE 4 MG PO KIT: PACK | ORAL | Status: DC

## 2015-01-11 NOTE — Discharge Instructions (Addendum)
° °Dr. Frank Aluisio °Total Joint Specialist °[facility] °[address] °[place], [place] [address] °[phone] ° °TOTAL KNEE REPLACEMENT POSTOPERATIVE DIRECTIONS ° ° ° °Knee Rehabilitation, Guidelines Following Surgery  °Results after knee surgery are often greatly improved when you follow the exercise, range of motion and muscle strengthening exercises prescribed by your doctor. Safety measures are also important to protect the knee from further injury. Any time any of these exercises cause you to have increased pain or swelling in your knee joint, decrease the amount until you are comfortable again and slowly increase them. If you have problems or questions, call your caregiver or physical therapist for advice.  ° °HOME CARE INSTRUCTIONS  °Remove items at home which could result in a fall. This includes throw rugs or furniture in walking pathways.  °Continue medications as instructed at time of discharge. °You may have some home medications which will be placed on hold until you complete the course of blood thinner medication.  °You may start showering once you are discharged home but do not submerge the incision under water. Just pat the incision dry and apply a dry gauze dressing on daily. °Walk with walker as instructed.  °You may resume a sexual relationship in one month or when given the OK by  your doctor.  °· Use walker as long as suggested by your caregivers. °· Avoid periods of inactivity such as sitting longer than an hour when not asleep. This helps prevent blood clots.  °You may put full weight on your legs and walk as much as is comfortable.  °You may return to work once you are cleared by your doctor.  °Do not drive a car for 6 weeks or until released by you surgeon.  °· Do not drive while taking narcotics.  °Wear the elastic stockings for three weeks following surgery during the day but you may remove then at night. °Make sure you keep all of your appointments after your  operation with all of your doctors and caregivers. You should call the office at the above phone number and make an appointment for approximately two weeks after the date of your surgery. °Change the dressing daily and reapply a dry dressing each time. °Please pick up a stool softener and laxative for home use as long as you are requiring pain medications. °· ICE to the affected knee every three hours for 30 minutes at a time and then as needed for pain and swelling.  Continue to use ice on the knee for pain and swelling from surgery. You may notice swelling that will progress down to the foot and ankle.  This is normal after surgery.  Elevate the leg when you are not up walking on it.   °It is important for you to complete the blood thinner medication as prescribed by your doctor. °· Continue to use the breathing machine which will help keep your temperature down.  It is common for your temperature to cycle up and down following surgery, especially at night when you are not up moving around and exerting yourself.  The breathing machine keeps your lungs expanded and your temperature down. ° °RANGE OF MOTION AND STRENGTHENING EXERCISES  °Rehabilitation of the knee is important following a knee injury or an operation. After just a few days of immobilization, the muscles of the thigh which control the knee become weakened and shrink (atrophy). Knee exercises are designed to build up the tone and strength of the thigh muscles and to improve knee   motion. Often times heat used for twenty to thirty minutes before working out will loosen up your tissues and help with improving the range of motion but do not use heat for the first two weeks following surgery. These exercises can be done on a training (exercise) mat, on the floor, on a table or on a bed. Use what ever works the best and is most comfortable for you Knee exercises include:  °Leg Lifts - While your knee is still immobilized in a splint or cast, you can do  straight leg raises. Lift the leg to 60 degrees, hold for 3 sec, and slowly lower the leg. Repeat 10-20 times 2-3 times daily. Perform this exercise against resistance later as your knee gets better.  °Quad and Hamstring Sets - Tighten up the muscle on the front of the thigh (Quad) and hold for 5-10 sec. Repeat this 10-20 times hourly. Hamstring sets are done by pushing the foot backward against an object and holding for 5-10 sec. Repeat as with quad sets.  °A rehabilitation program following serious knee injuries can speed recovery and prevent re-injury in the future due to weakened muscles. Contact your doctor or a physical therapist for more information on knee rehabilitation.  ° °SKILLED REHAB INSTRUCTIONS: °If the patient is transferred to a skilled rehab facility following release from the hospital, a list of the current medications will be sent to the facility for the patient to continue.  When discharged from the skilled rehab facility, please have the facility set up the patient's Home Health Physical Therapy prior to being released. Also, the skilled facility will be responsible for providing the patient with their medications at time of release from the facility to include their pain medication, the muscle relaxants, and their blood thinner medication. If the patient is still at the rehab facility at time of the two week follow up appointment, the skilled rehab facility will also need to assist the patient in arranging follow up appointment in our office and any transportation needs. ° °MAKE SURE YOU:  °Understand these instructions.  °Will watch your condition.  °Will get help right away if you are not doing well or get worse.  ° ° °Pick up stool softner and laxative for home use following surgery while on pain medications. °Do not submerge incision under water. °Please use good hand washing techniques while changing dressing each day. °May shower starting three days after surgery. °Please use a clean  towel to pat the incision dry following showers. °Continue to use ice for pain and swelling after surgery. °Do not use any lotions or creams on the incision until instructed by your surgeon. ° °Take Xarelto for two and a half more weeks, then discontinue Xarelto. °Once the patient has completed the Xarelto, they may resume the 81 mg Aspirin. ° °Postoperative Constipation Protocol ° °Constipation - defined medically as fewer than three stools per week and severe constipation as less than one stool per week. ° °One of the most common issues patients have following surgery is constipation.  Even if you have a regular bowel pattern at home, your normal regimen is likely to be disrupted due to multiple reasons following surgery.  Combination of anesthesia, postoperative narcotics, change in appetite and fluid intake all can affect your bowels.  In order to avoid complications following surgery, here are some recommendations in order to help you during your recovery period. ° °Colace (docusate) - Pick up an over-the-counter form of Colace or another stool softener and take   twice a day as long as you are requiring postoperative pain medications.  Take with a full glass of water daily.  If you experience loose stools or diarrhea, hold the colace until you stool forms back up.  If your symptoms do not get better within 1 week or if they get worse, check with your doctor.  Dulcolax (bisacodyl) - Pick up over-the-counter and take as directed by the product packaging as needed to assist with the movement of your bowels.  Take with a full glass of water.  Use this product as needed if not relieved by Colace only.   MiraLax (polyethylene glycol) - Pick up over-the-counter to have on hand.  MiraLax is a solution that will increase the amount of water in your bowels to assist with bowel movements.  Take as directed and can mix with a glass of water, juice, soda, coffee, or tea.  Take if you go more than two days without a  movement. Do not use MiraLax more than once per day. Call your doctor if you are still constipated or irregular after using this medication for 7 days in a row.  If you continue to have problems with postoperative constipation, please contact the office for further assistance and recommendations.  If you experience "the worst abdominal pain ever" or develop nausea or vomiting, please contact the office immediatly for further recommendations for treatment.    Information on my medicine - XARELTO (Rivaroxaban)  This medication education was reviewed with me or my healthcare representative as part of my discharge preparation.  The pharmacist that spoke with me during my hospital stay was:  Kara Mead, [facility]  Why was Xarelto prescribed for you? Xarelto was prescribed for you to reduce the risk of blood clots forming after orthopedic surgery. The medical term for these abnormal blood clots is venous thromboembolism (VTE).  What do you need to know about xarelto ? Take your Xarelto ONCE DAILY at the same time every day. You may take it either with or without food.  If you have difficulty swallowing the tablet whole, you may crush it and mix in applesauce just prior to taking your dose.  Take Xarelto exactly as prescribed by your doctor and DO NOT stop taking Xarelto without talking to the doctor who prescribed the medication.  Stopping without other VTE prevention medication to take the place of Xarelto may increase your risk of developing a clot.  After discharge, you should have regular check-up appointments with your healthcare provider that is prescribing your Xarelto.    What do you do if you miss a dose? If you miss a dose, take it as soon as you remember on the same day then continue your regularly scheduled once daily regimen the next day. Do not take two doses of Xarelto on the same day.   Important Safety Information A possible side effect of Xarelto is bleeding.  You should call your healthcare provider right away if you experience any of the following: ? Bleeding from an injury or your nose that does not stop. ? Unusual colored urine (red or dark brown) or unusual colored stools (red or black). ? Unusual bruising for unknown reasons. ? A serious fall or if you hit your head (even if there is no bleeding).  Some medicines may interact with Xarelto and might increase your risk of bleeding while on Xarelto. To help avoid this, consult your healthcare provider or pharmacist prior to using any new prescription or non-prescription medications, including herbals,  vitamins, non-steroidal anti-inflammatory drugs (NSAIDs) and supplements.  This website has more information on Xarelto: [email or web address]

## 2015-01-11 NOTE — Progress Notes (Signed)
Physical Therapy Treatment [facility]    01/11/15 1400  PT Visit Information  Last PT Received On 01/11/15  Assistance Needed +1  History of Present Illness Pt is a 69 year old female s/p L TKA  PT Time Calculation  PT Start Time (ACUTE ONLY) 1348  PT Stop Time (ACUTE ONLY) 1407  PT Time Calculation (min) (ACUTE ONLY) 19 min  Subjective Data  Subjective Pt ambulated again in hallway and mobilizing well.  Precautions  Precautions Knee  Required Braces or Orthoses Knee Immobilizer - Left  Knee Immobilizer - Left Discontinue once straight leg raise with < 10 degree lag  Restrictions  Other Position/Activity Restrictions WBAT  Pain Assessment  Pain Assessment 0-10  Pain Score 5  Pain Location L knee  Pain Descriptors / Indicators Aching  Pain Intervention(s) Limited activity within patient's tolerance;Monitored during session;Repositioned;Ice applied  Cognition  Arousal/Alertness Awake/alert  Behavior During Therapy WFL for tasks assessed/performed  Overall Cognitive Status Within Functional Limits for tasks assessed  Transfers  Overall transfer level Needs assistance  Equipment used Rolling walker (2 wheeled)  Transfers Sit to/from Stand  Sit to Stand Min guard  General transfer comment verbal cues for UE and LE positioning  Ambulation/Gait  Ambulation/Gait assistance Min guard  Ambulation Distance (Feet) 200 Feet  Assistive device Rolling walker (2 wheeled)  Gait Pattern/deviations Step-to pattern;Antalgic;Trunk flexed  General Gait Details verbal cues for sequence, RW distance, step length  PT - End of Session  Equipment Utilized During Treatment Left knee immobilizer  Activity Tolerance Patient tolerated treatment well  Patient left in chair;with call bell/phone within reach;with family/visitor present  PT - Assessment/Plan  PT Plan Current plan remains appropriate  PT Frequency (ACUTE ONLY) 7X/week  Follow Up Recommendations Home health PT  PT equipment None recommended  by PT  PT Goal Progression  Progress towards PT goals Progressing toward goals  PT General Charges  $$ ACUTE PT VISIT 1 Procedure  PT Treatments  $Gait Training 8-22 mins   Carmelia Bake, PT, DPT 01/11/2015 Pager: [phone]

## 2015-01-11 NOTE — Progress Notes (Signed)
   Subjective: 1 Day Post-Op Procedure(s) (LRB): LEFT TOTAL KNEE ARTHROPLASTY (Left) Patient reports pain as mild and moderate.   Patient seen in rounds with Dr. Wynelle Link. She has some paresthesias to the top pf the foot. She was a lateral approach of the knee due to a valgus deformity.  She has good active dorsiflexion so changed dressing on rounds.  Will also place onto a prednisone dosepack.  Patient is well, but has had some minor complaints of pain in the knee and foot., requiring pain medications We will start therapy today.  Plan is to go Home after hospital stay.  Objective: Vital signs in last 24 hours: Temp:  [97.4 F (36.3 C)-99.3 F (37.4 C)] 99.1 F (37.3 C) (02/09 0510) Pulse Rate:  [48-73] 53 (02/09 0510) Resp:  [12-19] 16 (02/09 0510) BP: (100-139)/(59-82) 105/63 mmHg (02/09 0510) SpO2:  [97 %-100 %] 99 % (02/09 0510)  Intake/Output from previous day:  Intake/Output Summary (Last 24 hours) at 01/11/15 0954 Last data filed at 01/11/15 0824  Gross per 24 hour  Intake 4396.5 ml  Output   4185 ml  Net  211.5 ml    Intake/Output this shift: Total I/O In: 240 [P.O.:240] Out: -   Labs:  Recent Labs  01/11/15 0448  HGB 9.7*    Recent Labs  01/11/15 0448  WBC 12.1*  RBC 3.12*  HCT 29.7*  PLT 211    Recent Labs  01/11/15 0448  NA 138  K 3.6  CL 105  CO2 26  BUN 12  CREATININE 0.68  GLUCOSE 115*  CALCIUM 8.3*   No results for input(s): LABPT, INR in the last 72 hours.  EXAM General - Patient is Alert, Appropriate and Oriented Extremity - Neurovascular intact Dorsiflexion/Plantar flexion intact to the left foot, good active dorsiflexion (lateral approach for a valgus deformity) Dressing - dressing C/D/I Motor Function - intact, moving foot and toes well on exam.  Hemovac pulled without difficulty.  Past Medical History  Diagnosis Date  . Thyroid disease     problems with thyroid  . Alcoholism /alcohol abuse     sober since 1996  .  Hypothyroidism   . Arthritis     hands, knees.    Assessment/Plan: 1 Day Post-Op Procedure(s) (LRB): LEFT TOTAL KNEE ARTHROPLASTY (Left) Principal Problem:   OA (osteoarthritis) of knee  Estimated body mass index is 20.48 kg/(m^2) as calculated from the following:   Height as of this encounter: 5\' 2"  (1.575 m).   Weight as of this encounter: 50.803 kg (112 lb). Advance diet Up with therapy Plan for discharge tomorrow Discharge home with home health  Dressing changed today Monitor for left foot weakness due to prior valgus deformity  DVT Prophylaxis - Xarelto Weight-Bearing as tolerated to left leg D/C O2 and Pulse OX and try on Room Air Monitor pressures  Arlee Muslim, PA-C Orthopaedic Surgery 01/11/2015, 9:54 AM

## 2015-01-11 NOTE — Care Management Note (Signed)
    Page 1 of 1   01/11/2015     3:16:47 PM CARE MANAGEMENT NOTE 01/11/2015  Patient:  Erin Carroll, Erin Carroll   Account Number:  [id number]  Date Initiated:  01/11/2015  Documentation initiated by:  [facility]  Subjective/Objective Assessment:   adm: LEFT TOTAL KNEE ARTHROPLASTY (Left)     Action/Plan:   discharge planning   Anticipated DC Date:  01/12/2015   Anticipated DC Plan:  [place]  CM consult      [facility] Choice  HOME HEALTH   Choice offered to / List presented to:  C-1 Patient        [facility] arranged  HH-2 PT      [place]   Status of service:  Completed, signed off Medicare Important Message given?   (If response is "NO", the following Medicare IM given date fields will be blank) Date Medicare IM given:   Medicare IM given by:   Date Additional Medicare IM given:   Additional Medicare IM given by:    Discharge Disposition:  [place]  Per UR Regulation:    If discussed at Long Length of Stay Meetings, dates discussed:    Comments:  01/11/15 15:10 CM met with pt in room to offer choice of home health agency. Pt chooses [facility] to render HHPT.  address and contact informaiton verified by pt. No DME is needed. referral emailed to [employer] rep, Tim.  No other Cm needs were communicated.  Mariane Masters, BSn, Cm [phone].

## 2015-01-11 NOTE — Evaluation (Signed)
Occupational Therapy Evaluation Patient Details Name: RAYLIE MADDISON MRN: [id number] DOB: 21-Dec-1945 Today's Date: 01/11/2015    History of Present Illness Pt is a 69 year old female s/p L TKA   Clinical Impression   Pt is doing well with functional transfers to 3in1. Cues to go slower with turns and not twist at knee. Gave verbal cues for hand placement and LE management. Will follow pt for OT to progress ADL independence.     Follow Up Recommendations  No OT follow up;Supervision/Assistance - 24 hour    Equipment Recommendations  None recommended by OT    Recommendations for Other Services       Precautions / Restrictions Precautions Precautions: Knee Required Braces or Orthoses: Knee Immobilizer - Left Knee Immobilizer - Left: Discontinue once straight leg raise with < 10 degree lag Restrictions Weight Bearing Restrictions: No Other Position/Activity Restrictions: WBAT      Mobility Bed Mobility Overal bed mobility: Needs Assistance Bed Mobility: Supine to Sit;Sit to Supine     Supine to sit: Min guard;HOB elevated Sit to supine: Min guard;HOB elevated      Transfers Overall transfer level: Needs assistance Equipment used: Rolling walker (2 wheeled) Transfers: Sit to/from Stand Sit to Stand: Min guard         General transfer comment: verbal cues for hand placement and LE management. Cues to back up to surface fully before sitting.     Balance                                            ADL Overall ADL's : Needs assistance/impaired Eating/Feeding: Independent;Sitting   Grooming: Wash/dry hands;Set up;Sitting   Upper Body Bathing: Set up;Sitting   Lower Body Bathing: Minimal assistance;Sit to/from stand   Upper Body Dressing : Set up;Sitting   Lower Body Dressing: Minimal assistance;Sit to/from stand   Toilet Transfer: Min guard;Ambulation;BSC;RW   Toileting- Clothing Manipulation and Hygiene: Minimal assistance;Sit to/from  stand         General ADL Comments: Educated on KI wear and how to don/doff and when to wear. Pt did well with toilet transfer and has higher toilet at home and grab bar. Feel this will likely be sufficient and may not need 3in1 over toilet. Will try higher toilet and bar next visit. Educated on use of 3in1 as BSC for night and as shower chair. pt already reaching to L foot for dressing.      Vision                     Perception     Praxis      Pertinent Vitals/Pain Pain Assessment: 0-10 Pain Score: 4  Pain Location: L knee Pain Descriptors / Indicators: Aching Pain Intervention(s): Repositioned;Ice applied     Hand Dominance     Extremity/Trunk Assessment Upper Extremity Assessment Upper Extremity Assessment: Overall WFL for tasks assessed   Lower Extremity Assessment Lower Extremity Assessment: LLE deficits/detail LLE Deficits / Details: unable to perform SLR, good quad contraction, knee flexion AAROM approx 65* with HS       Communication Communication Communication: No difficulties   Cognition Arousal/Alertness: Awake/alert Behavior During Therapy: WFL for tasks assessed/performed Overall Cognitive Status: Within Functional Limits for tasks assessed                     General Comments  Exercises       Shoulder Instructions      Home Living Family/patient expects to be discharged to:: Private residence Living Arrangements: Spouse/significant other Available Help at Discharge: Family Type of Home: House Home Access: Stairs to enter [employer] of Steps: 2 wide steps Entrance Stairs-Rails: None Home Layout: Multi-level;Able to live on main level with bedroom/bathroom     Bathroom Shower/Tub: Occupational psychologist: Handicapped height     Home Equipment: Grab bars - tub/shower;Crutches;Bedside commode;Walker - 2 wheels;Grab bars - toilet          Prior Functioning/Environment Level of Independence:  Independent             OT Diagnosis: Generalized weakness   OT Problem List: Decreased strength;Decreased knowledge of use of DME or AE   OT Treatment/Interventions: Self-care/ADL training;Patient/family education;Therapeutic activities;DME and/or AE instruction    OT Goals(Current goals can be found in the care plan section) Acute Rehab OT Goals Patient Stated Goal: return to independence. OT Goal Formulation: With patient Time For Goal Achievement: 01/18/15 Potential to Achieve Goals: Good  OT Frequency: Min 2X/week   Barriers to D/C:            Co-evaluation              End of Session Equipment Utilized During Treatment: Rolling walker;Left knee immobilizer  Activity Tolerance: Patient tolerated treatment well Patient left: in bed;with call bell/phone within reach   Time: 1120-1139 OT Time Calculation (min): 19 min Charges:  OT General Charges $OT Visit: 1 Procedure OT Evaluation $Initial OT Evaluation Tier I: 1 Procedure G-Codes:    Jules Schick  [phone] 01/11/2015, 11:58 AM

## 2015-01-11 NOTE — Evaluation (Signed)
Physical Therapy Evaluation Patient Details Name: Erin Carroll MRN: [id number] DOB: 10-Apr-1946 Today's Date: 01/11/2015   History of Present Illness  Pt is a 69 year old female s/p L TKA  Clinical Impression  Pt is s/p L TKA resulting in the deficits listed below (see PT Problem List).  Pt will benefit from skilled PT to increase their independence and safety with mobility to allow discharge to the venue listed below.  Pt mobilizing well POD #1 and plans to d/c home possibly tomorrow with assist from sister and spouse.     Follow Up Recommendations Home health PT    Equipment Recommendations  None recommended by PT    Recommendations for Other Services       Precautions / Restrictions Precautions Precautions: Knee Required Braces or Orthoses: Knee Immobilizer - Left Knee Immobilizer - Left: Discontinue once straight leg raise with < 10 degree lag Restrictions Weight Bearing Restrictions: No Other Position/Activity Restrictions: WBAT      Mobility  Bed Mobility Overal bed mobility: Needs Assistance Bed Mobility: Supine to Sit;Sit to Supine     Supine to sit: Min guard;HOB elevated Sit to supine: Min guard;HOB elevated   General bed mobility comments: pt up in recliner on arrival  Transfers Overall transfer level: Needs assistance Equipment used: Rolling walker (2 wheeled) Transfers: Sit to/from Stand Sit to Stand: Min guard         General transfer comment: verbal cues for UE and LE positioning  Ambulation/Gait Ambulation/Gait assistance: Min guard Ambulation Distance (Feet): 120 Feet Assistive device: Rolling walker (2 wheeled) Gait Pattern/deviations: Step-to pattern;Antalgic     General Gait Details: verbal cues for sequence, RW distance, step length  Stairs            Wheelchair Mobility    Modified Rankin (Stroke Patients Only)       Balance                                             Pertinent Vitals/Pain Pain  Assessment: 0-10 Pain Score: 4  Pain Location: L knee Pain Descriptors / Indicators: Aching Pain Intervention(s): Repositioned;Ice applied    Home Living Family/patient expects to be discharged to:: Private residence Living Arrangements: Spouse/significant other Available Help at Discharge: Family Type of Home: House Home Access: Stairs to enter Entrance Stairs-Rails: None Entrance Stairs-Number of Steps: 2 wide steps Home Layout: Multi-level;Able to live on main level with bedroom/bathroom Home Equipment: Grab bars - tub/shower;Crutches;Bedside commode;Walker - 2 wheels;Grab bars - toilet      Prior Function Level of Independence: Independent               Hand Dominance        Extremity/Trunk Assessment   Upper Extremity Assessment: Overall WFL for tasks assessed           Lower Extremity Assessment: LLE deficits/detail   LLE Deficits / Details: unable to perform SLR, good quad contraction, knee flexion AAROM approx 65* with HS     Communication   Communication: No difficulties  Cognition Arousal/Alertness: Awake/alert Behavior During Therapy: WFL for tasks assessed/performed Overall Cognitive Status: Within Functional Limits for tasks assessed                      General Comments      Exercises Total Joint Exercises Ankle Circles/Pumps: AROM;Both;15 reps Quad Sets: AROM;Both;15  reps Towel Squeeze: AROM;Both;15 reps Short Arc QuadSinclair Ship;Left;15 reps Heel Slides: AAROM;Left;15 reps Hip ABduction/ADduction: AAROM;Left;15 reps Straight Leg Raises: AAROM;Left;10 reps      Assessment/Plan    PT Assessment Patient needs continued PT services  PT Diagnosis Acute pain;Difficulty walking   PT Problem List Decreased strength;Decreased range of motion;Decreased mobility;Decreased knowledge of precautions;Pain  PT Treatment Interventions Functional mobility training;Stair training;Gait training;DME instruction;Patient/family  education;Therapeutic activities;Therapeutic exercise   PT Goals (Current goals can be found in the Care Plan section) Acute Rehab PT Goals Patient Stated Goal: return to independence. PT Goal Formulation: With patient Time For Goal Achievement: 01/14/15 Potential to Achieve Goals: Good    Frequency 7X/week   Barriers to discharge        Co-evaluation               End of Session Equipment Utilized During Treatment: Left knee immobilizer Activity Tolerance: Patient tolerated treatment well Patient left: in chair;with call bell/phone within reach           Time: 0950-1015 PT Time Calculation (min) (ACUTE ONLY): 25 min   Charges:   PT Evaluation $Initial PT Evaluation Tier I: 1 Procedure PT Treatments $Therapeutic Exercise: 8-22 mins   PT G Codes:        Chloee Tena,KATHrine E 01/11/2015, 12:55 PM Carmelia Bake, PT, DPT 01/11/2015 Pager: [phone]

## 2015-01-11 NOTE — Discharge Summary (Signed)
Physician Discharge Summary   Patient ID: Erin Carroll MRN: [id number] DOB/AGE: 02/03/1946 69 y.o.  Admit date: 01/10/2015 Discharge date: 01/12/2015  Primary Diagnosis:  Osteoarthritis Left knee(s)  Admission Diagnoses:  Past Medical History  Diagnosis Date  . Thyroid disease     problems with thyroid  . Alcoholism /alcohol abuse     sober since 1996  . Hypothyroidism   . Arthritis     hands, knees.   Discharge Diagnoses:   Principal Problem:   OA (osteoarthritis) of knee  Estimated body mass index is 20.48 kg/(m^2) as calculated from the following:   Height as of this encounter: 5' 2" (1.575 m).   Weight as of this encounter: 50.803 kg (112 lb).  Procedure:  Procedure(s) (LRB): LEFT TOTAL KNEE ARTHROPLASTY (Left)   Consults: None  HPI: Erin Carroll is a 69 y.o. year old female with end stage OA of her left knee with progressively worsening pain and dysfunction. She has constant pain, with activity and at rest and significant functional deficits with difficulties even with ADLs. She has had extensive non-op management including analgesics, injections of cortisone and viscosupplements, and home exercise program, but remains in significant pain with significant dysfunction. Radiographs show bone on bone arthritis lateral and patellofemoral with valgus deformity. She presents now for left Total Knee Arthroplasty.   Laboratory Data: Admission on 01/10/2015, Discharged on 01/12/2015  Component Date Value Ref Range Status  . ABO/RH(D) 01/10/2015 O POS   Final  . Antibody Screen 01/10/2015 NEG   Final  . Sample Expiration 01/10/2015 01/13/2015   Final  . ABO/RH(D) 01/10/2015 O POS   Final  . WBC 01/11/2015 12.1* 4.0 - 10.5 K/uL Final  . RBC 01/11/2015 3.12* 3.87 - 5.11 MIL/uL Final  . Hemoglobin 01/11/2015 9.7* 12.0 - 15.0 g/dL Final  . HCT 01/11/2015 29.7* 36.0 - 46.0 % Final  . MCV 01/11/2015 95.2  78.0 - 100.0 fL Final  . MCH 01/11/2015 31.1  26.0 - 34.0 pg Final  .  MCHC 01/11/2015 32.7  30.0 - 36.0 g/dL Final  . RDW 01/11/2015 14.5  11.5 - 15.5 % Final  . Platelets 01/11/2015 211  150 - 400 K/uL Final  . Sodium 01/11/2015 138  135 - 145 mmol/L Final  . Potassium 01/11/2015 3.6  3.5 - 5.1 mmol/L Final  . Chloride 01/11/2015 105  96 - 112 mmol/L Final  . CO2 01/11/2015 26  19 - 32 mmol/L Final  . Glucose, Bld 01/11/2015 115* 70 - 99 mg/dL Final  . BUN 01/11/2015 12  6 - 23 mg/dL Final  . Creatinine, Ser 01/11/2015 0.68  0.50 - 1.10 mg/dL Final  . Calcium 01/11/2015 8.3* 8.4 - 10.5 mg/dL Final  . GFR calc non Af Amer 01/11/2015 89* >90 mL/min Final  . GFR calc Af Amer 01/11/2015 >90  >90 mL/min Final   Comment: (NOTE) The eGFR has been calculated using the CKD EPI equation. This calculation has not been validated in all clinical situations. eGFR's persistently <90 mL/min signify possible Chronic Kidney Disease.   . Anion gap 01/11/2015 7  5 - 15 Final  . WBC 01/12/2015 11.7* 4.0 - 10.5 K/uL Final  . RBC 01/12/2015 3.26* 3.87 - 5.11 MIL/uL Final  . Hemoglobin 01/12/2015 10.1* 12.0 - 15.0 g/dL Final  . HCT 01/12/2015 31.0* 36.0 - 46.0 % Final  . MCV 01/12/2015 95.1  78.0 - 100.0 fL Final  . MCH 01/12/2015 31.0  26.0 - 34.0 pg Final  . MCHC 01/12/2015   32.6  30.0 - 36.0 g/dL Final  . RDW 01/12/2015 14.7  11.5 - 15.5 % Final  . Platelets 01/12/2015 197  150 - 400 K/uL Final  . Sodium 01/12/2015 141  135 - 145 mmol/L Final  . Potassium 01/12/2015 3.6  3.5 - 5.1 mmol/L Final  . Chloride 01/12/2015 104  96 - 112 mmol/L Final  . CO2 01/12/2015 29  19 - 32 mmol/L Final  . Glucose, Bld 01/12/2015 108* 70 - 99 mg/dL Final  . BUN 01/12/2015 12  6 - 23 mg/dL Final  . Creatinine, Ser 01/12/2015 0.67  0.50 - 1.10 mg/dL Final  . Calcium 01/12/2015 8.6  8.4 - 10.5 mg/dL Final  . GFR calc non Af Amer 01/12/2015 90* >90 mL/min Final  . GFR calc Af Amer 01/12/2015 >90  >90 mL/min Final   Comment: (NOTE) The eGFR has been calculated using the CKD EPI  equation. This calculation has not been validated in all clinical situations. eGFR's persistently <90 mL/min signify possible Chronic Kidney Disease.   . Anion gap 01/12/2015 8  5 - 15 Final  Hospital Outpatient Visit on 01/03/2015  Component Date Value Ref Range Status  . aPTT 01/03/2015 29  24 - 37 seconds Final  . Prothrombin Time 01/03/2015 13.4  11.6 - 15.2 seconds Final  . INR 01/03/2015 1.01  0.00 - 1.49 Final  . Color, Urine 01/03/2015 YELLOW  YELLOW Final  . APPearance 01/03/2015 CLEAR  CLEAR Final  . Specific Gravity, Urine 01/03/2015 1.006  1.005 - 1.030 Final  . pH 01/03/2015 6.0  5.0 - 8.0 Final  . Glucose, UA 01/03/2015 NEGATIVE  NEGATIVE mg/dL Final  . Hgb urine dipstick 01/03/2015 NEGATIVE  NEGATIVE Final  . Bilirubin Urine 01/03/2015 NEGATIVE  NEGATIVE Final  . Ketones, ur 01/03/2015 NEGATIVE  NEGATIVE mg/dL Final  . Protein, ur 01/03/2015 NEGATIVE  NEGATIVE mg/dL Final  . Urobilinogen, UA 01/03/2015 0.2  0.0 - 1.0 mg/dL Final  . Nitrite 01/03/2015 NEGATIVE  NEGATIVE Final  . Leukocytes, UA 01/03/2015 NEGATIVE  NEGATIVE Final   MICROSCOPIC NOT DONE ON URINES WITH NEGATIVE PROTEIN, BLOOD, LEUKOCYTES, NITRITE, OR GLUCOSE <1000 mg/dL.  . MRSA, PCR 01/03/2015 NEGATIVE  NEGATIVE Final  . Staphylococcus aureus 01/03/2015 POSITIVE* NEGATIVE Final   Comment:        The Xpert SA Assay (FDA approved for NASAL specimens in patients over 21 years of age), is one component of a comprehensive surveillance program.  Test performance has been validated by [facility] for patients greater than or equal to 1 year old. It is not intended to diagnose infection nor to guide or monitor treatment.      X-Rays:No results found.  EKG: Orders placed or performed in visit on 11/14/12  . EKG 12-Lead     Hospital Course: Bralee O Viruet is a 69 y.o. who was admitted to [facility]. They were brought to the operating room on 01/10/2015 and underwent Procedure(s): LEFT  TOTAL KNEE ARTHROPLASTY.  Patient tolerated the procedure well and was later transferred to the recovery room and then to the orthopaedic floor for postoperative care.  They were given PO and IV analgesics for pain control following their surgery.  They were given 24 hours of postoperative antibiotics of  Anti-infectives    Start     Dose/Rate Route Frequency Ordered Stop   01/10/15 1600  ceFAZolin (ANCEF) IVPB 2 g/50 mL premix     2 g 100 mL/hr over 30 Minutes Intravenous Every 6 hours 01/10/15   1355 01/10/15 2257   01/10/15 0729  ceFAZolin (ANCEF) IVPB 2 g/50 mL premix     2 g 100 mL/hr over 30 Minutes Intravenous On call to O.R. 01/10/15 4196 01/10/15 1018     and started on DVT prophylaxis in the form of Xarelto.   PT and OT were ordered for total joint protocol.  Discharge planning consulted to help with postop disposition and equipment needs.  Patient had a decent night on the evening of surgery.  The next morning she has some paresthesias to the top of her foot. She was a lateral approach of the knee due to a valgus deformity. She had good active dorsiflexion so changed dressing on rounds. Also placed onto a prednisone dosepack.   They started to get up OOB with therapy on day one. Hemovac drain was pulled without difficulty.  Continued to work with therapy into day two.  Dressing was changed on day two and the incision was healing well. She had good Dorsiflexion/Plantar flexion, good active dorsiflexion (lateral approach for a valgus deformity).  Patient was seen in rounds and was ready to go home on POD 2.  Discharge home with home health Diet - Regular diet Follow up - in 2 weeks Activity - WBAT Disposition - Home Condition Upon Discharge - Good D/C Meds - See DC Summary DVT Prophylaxis - Xarelto      Discharge Instructions    Call MD / Call 911    Complete by:  As directed   If you experience chest pain or shortness of breath, CALL 911 and be transported to the hospital emergency  room.  If you develope a fever above 101 F, pus (white drainage) or increased drainage or redness at the wound, or calf pain, call your surgeon's office.     Change dressing    Complete by:  As directed   Change dressing daily with sterile 4 x 4 inch gauze dressing and apply TED hose. Do not submerge the incision under water.     Constipation Prevention    Complete by:  As directed   Drink plenty of fluids.  Prune juice may be helpful.  You may use a stool softener, such as Colace (over the counter) 100 mg twice a day.  Use MiraLax (over the counter) for constipation as needed.     Diet general    Complete by:  As directed      Discharge instructions    Complete by:  As directed   Pick up stool softner and laxative for home use following surgery while on pain medications. Do not submerge incision under water. Please use good hand washing techniques while changing dressing each day. May shower starting three days after surgery. Please use a clean towel to pat the incision dry following showers. Continue to use ice for pain and swelling after surgery. Do not use any lotions or creams on the incision until instructed by your surgeon.  Take Xarelto for two and a half more weeks, then discontinue Xarelto. Once the patient has completed the Xarelto, they may resume the 81 mg Aspirin.  Postoperative Constipation Protocol  Constipation - defined medically as fewer than three stools per week and severe constipation as less than one stool per week.  One of the most common issues patients have following surgery is constipation.  Even if you have a regular bowel pattern at home, your normal regimen is likely to be disrupted due to multiple reasons following surgery.  Combination of anesthesia, postoperative  narcotics, change in appetite and fluid intake all can affect your bowels.  In order to avoid complications following surgery, here are some recommendations in order to help you during your recovery  period.  Colace (docusate) - Pick up an over-the-counter form of Colace or another stool softener and take twice a day as long as you are requiring postoperative pain medications.  Take with a full glass of water daily.  If you experience loose stools or diarrhea, hold the colace until you stool forms back up.  If your symptoms do not get better within 1 week or if they get worse, check with your doctor.  Dulcolax (bisacodyl) - Pick up over-the-counter and take as directed by the product packaging as needed to assist with the movement of your bowels.  Take with a full glass of water.  Use this product as needed if not relieved by Colace only.   MiraLax (polyethylene glycol) - Pick up over-the-counter to have on hand.  MiraLax is a solution that will increase the amount of water in your bowels to assist with bowel movements.  Take as directed and can mix with a glass of water, juice, soda, coffee, or tea.  Take if you go more than two days without a movement. Do not use MiraLax more than once per day. Call your doctor if you are still constipated or irregular after using this medication for 7 days in a row.  If you continue to have problems with postoperative constipation, please contact the office for further assistance and recommendations.  If you experience "the worst abdominal pain ever" or develop nausea or vomiting, please contact the office immediatly for further recommendations for treatment.     Do not put a pillow under the knee. Place it under the heel.    Complete by:  As directed      Do not sit on low chairs, stoools or toilet seats, as it may be difficult to get up from low surfaces    Complete by:  As directed      Driving restrictions    Complete by:  As directed   No driving until released by the physician.     Increase activity slowly as tolerated    Complete by:  As directed      Lifting restrictions    Complete by:  As directed   No lifting until released by the physician.      Patient may shower    Complete by:  As directed   You may shower without a dressing once there is no drainage.  Do not wash over the wound.  If drainage remains, do not shower until drainage stops.     TED hose    Complete by:  As directed   Use stockings (TED hose) for 3 weeks on both leg(s).  You may remove them at night for sleeping.     Weight bearing as tolerated    Complete by:  As directed   Laterality:  left  Extremity:  Lower            Medication List    STOP taking these medications        aspirin 81 MG tablet     Biotin 5000 MCG Tabs     celecoxib 200 MG capsule  Commonly known as:  CELEBREX     COMBIPATCH 0.05-0.14 MG/DAY  Generic drug:  estradiol-norethindrone     HYDROcodone-acetaminophen 5-325 MG per tablet  Commonly known as:  NORCO       multivitamin tablet     pyridOXINE 100 MG tablet  Commonly known as:  VITAMIN B-6      TAKE these medications        docusate sodium 100 MG capsule  Commonly known as:  COLACE  Take 100 mg by mouth every morning.     iron polysaccharides 150 MG capsule  Commonly known as:  NIFEREX  Take 1 capsule (150 mg total) by mouth daily.     levothyroxine 75 MCG tablet  Commonly known as:  SYNTHROID, LEVOTHROID  Take 1 tablet (75 mcg total) by mouth daily before breakfast.     methocarbamol 500 MG tablet  Commonly known as:  ROBAXIN  Take 1 tablet (500 mg total) by mouth every 6 (six) hours as needed for muscle spasms.     methylPREDNISolone 4 MG tablet  Commonly known as:  MEDROL DOSEPAK  Take as directed on dosepack     metoCLOPramide 5 MG tablet  Commonly known as:  REGLAN  Take 1 tablet (5 mg total) by mouth every 8 (eight) hours as needed for nausea (if ondansetron (ZOFRAN) ineffective.).     ondansetron 4 MG tablet  Commonly known as:  ZOFRAN  Take 1 tablet (4 mg total) by mouth every 6 (six) hours as needed for nausea.     oxyCODONE 5 MG immediate release tablet  Commonly known as:  Oxy IR/ROXICODONE   Take 1-2 tablets (5-10 mg total) by mouth every 3 (three) hours as needed for moderate pain, severe pain or breakthrough pain.     polyethylene glycol packet  Commonly known as:  MIRALAX / GLYCOLAX  Take 17 g by mouth daily as needed for mild constipation.     rivaroxaban 10 MG Tabs tablet  Commonly known as:  XARELTO  - Take 1 tablet (10 mg total) by mouth daily with breakfast. Take Xarelto for two and a half more weeks, then discontinue Xarelto.  - Once the patient has completed the Xarelto, they may resume the 81 mg Aspirin.     traMADol 50 MG tablet  Commonly known as:  ULTRAM  Take 1-2 tablets (50-100 mg total) by mouth every 6 (six) hours as needed (mild pain).       Follow-up Information    Follow up with [facility].   Why:  home health physical therapy   Contact information:   [place] [address] [phone]       Follow up with [name], MD. Schedule an appointment as soon as possible for a visit on 01/25/2015.   Specialty:  Orthopedic Surgery   Why:  Call office at [phone] to set up appointment on Tuesday 01/25/2015   Contact information:   [address] [place] [address] [phone]       Signed: Arlee Muslim, PA-C Orthopaedic Surgery 01/31/2015, 7:44 AM

## 2015-01-12 LAB — CBC
HEMATOCRIT: 31 % — AB (ref 36.0–46.0)
Hemoglobin: 10.1 g/dL — ABNORMAL LOW (ref 12.0–15.0)
MCH: 31 pg (ref 26.0–34.0)
MCHC: 32.6 g/dL (ref 30.0–36.0)
MCV: 95.1 fL (ref 78.0–100.0)
Platelets: 197 10*3/uL (ref 150–400)
RBC: 3.26 MIL/uL — ABNORMAL LOW (ref 3.87–5.11)
RDW: 14.7 % (ref 11.5–15.5)
WBC: 11.7 10*3/uL — AB (ref 4.0–10.5)

## 2015-01-12 LAB — BASIC METABOLIC PANEL
Anion gap: 8 (ref 5–15)
BUN: 12 mg/dL (ref 6–23)
CHLORIDE: 104 mmol/L (ref 96–112)
CO2: 29 mmol/L (ref 19–32)
Calcium: 8.6 mg/dL (ref 8.4–10.5)
Creatinine, Ser: 0.67 mg/dL (ref 0.50–1.10)
GFR, EST NON AFRICAN AMERICAN: 90 mL/min — AB (ref >90)
Glucose, Bld: 108 mg/dL — ABNORMAL HIGH (ref 70–99)
POTASSIUM: 3.6 mmol/L (ref 3.5–5.1)
SODIUM: 141 mmol/L (ref 135–145)

## 2015-01-12 NOTE — Progress Notes (Signed)
Occupational Therapy Treatment Patient Details Name: Erin Carroll MRN: [id number] DOB: 1945/12/20 Today's Date: 01/12/2015    History of present illness Pt is a 69 year old female s/p L TKA   OT comments  Pt doing well. Practiced comfort height commode with bar for toilet transfer and shower transfer. Pt supposed to d/c today.   Follow Up Recommendations  No OT follow up;Supervision/Assistance - 24 hour    Equipment Recommendations  None recommended by OT    Recommendations for Other Services      Precautions / Restrictions Precautions Precautions: Knee Required Braces or Orthoses: Knee Immobilizer - Left Knee Immobilizer - Left: Discontinue once straight leg raise with < 10 degree lag Restrictions Weight Bearing Restrictions: No Other Position/Activity Restrictions: WBAT       Mobility Bed Mobility   Bed Mobility: Supine to Sit     Supine to sit: Supervision        Transfers Overall transfer level: Needs assistance Equipment used: Rolling walker (2 wheeled) Transfers: Sit to/from Stand Sit to Stand: Supervision         General transfer comment: verbal cues for hand placement.    Balance                                   ADL                           Toilet Transfer: Min guard;Ambulation;Comfort height toilet;RW;Grab bars   Toileting- Clothing Manipulation and Hygiene: Min guard;Sit to/from stand   Tub/ Shower Transfer: Walk-in shower;Minimal assistance;Rolling walker     General ADL Comments: Pt practiced shower transfer X 3 and educated pt on where caregiver needs to hold walker to stabilize as she steps in and out of shower. Also educated on where to place 3in1 in shower and how to use walker to pivot around to 3in1 in shower. Pt did well with comfort height commode and grab bar today and feel she is ok to use her higher toilet and grab bar (on R side) at home as her feet touch the floor better than on 3in1.        Vision                     Perception     Praxis      Cognition   Behavior During Therapy: WFL for tasks assessed/performed Overall Cognitive Status: Within Functional Limits for tasks assessed                       Extremity/Trunk Assessment               Exercises     Shoulder Instructions       General Comments      Pertinent Vitals/ Pain       Pain Assessment: 0-10 Pain Score: 4  Pain Location: L knee Pain Descriptors / Indicators: Aching Pain Intervention(s): Repositioned;Ice applied  Home Living                                          Prior Functioning/Environment              Frequency Min 2X/week     Progress Toward Goals  OT Goals(current goals  can now be found in the care plan section)  Progress towards OT goals: Progressing toward goals     Plan Discharge plan remains appropriate    Co-evaluation                 End of Session Equipment Utilized During Treatment: Rolling walker;Left knee immobilizer CPM Left Knee CPM Left Knee: Off   Activity Tolerance Patient tolerated treatment well   Patient Left in chair;with call bell/phone within reach   Nurse Communication          Time: 7262-0355 OT Time Calculation (min): 36 min  Charges: OT General Charges $OT Visit: 1 Procedure OT Treatments $Self Care/Home Management : 8-22 mins $Therapeutic Activity: 8-22 mins  Jules Schick  [phone] 01/12/2015, 9:54 AM

## 2015-01-12 NOTE — Progress Notes (Signed)
   Subjective: 2 Days Post-Op Procedure(s) (LRB): LEFT TOTAL KNEE ARTHROPLASTY (Left) Patient reports pain as mild.   Patient seen in rounds with Dr. Wynelle Link. Patient is well, and has had no acute complaints or problems Patient is ready to go home  Objective: Vital signs in last 24 hours: Temp:  [97.5 F (36.4 C)-98.7 F (37.1 C)] 98.7 F (37.1 C) (02/10 0656) Pulse Rate:  [55-87] 86 (02/10 0656) Resp:  [16-18] 17 (02/10 0656) BP: (105-134)/(67-79) 126/67 mmHg (02/10 0656) SpO2:  [99 %-100 %] 99 % (02/10 0656)  Intake/Output from previous day:  Intake/Output Summary (Last 24 hours) at 01/12/15 0757 Last data filed at 01/12/15 0600  Gross per 24 hour  Intake 2379.5 ml  Output   1425 ml  Net  954.5 ml   Labs:  Recent Labs  01/11/15 0448 01/12/15 0528  HGB 9.7* 10.1*    Recent Labs  01/11/15 0448 01/12/15 0528  WBC 12.1* 11.7*  RBC 3.12* 3.26*  HCT 29.7* 31.0*  PLT 211 197    Recent Labs  01/11/15 0448 01/12/15 0528  NA 138 141  K 3.6 3.6  CL 105 104  CO2 26 29  BUN 12 12  CREATININE 0.68 0.67  GLUCOSE 115* 108*  CALCIUM 8.3* 8.6   No results for input(s): LABPT, INR in the last 72 hours.  EXAM: General - Patient is Alert, Appropriate and Oriented Extremity - Neurovascular intact Sensation intact distally Dorsiflexion/Plantar flexion intact, good active dorsiflexion (lateral approach for a valgus deformity) Incision - clean, dry, no drainage Motor Function - intact, moving foot and toes well on exam.   Assessment/Plan: 2 Days Post-Op Procedure(s) (LRB): LEFT TOTAL KNEE ARTHROPLASTY (Left) Procedure(s) (LRB): LEFT TOTAL KNEE ARTHROPLASTY (Left) Past Medical History  Diagnosis Date  . Thyroid disease     problems with thyroid  . Alcoholism /alcohol abuse     sober since 1996  . Hypothyroidism   . Arthritis     hands, knees.   Principal Problem:   OA (osteoarthritis) of knee  Estimated body mass index is 20.48 kg/(m^2) as calculated  from the following:   Height as of this encounter: 5\' 2"  (1.575 m).   Weight as of this encounter: 50.803 kg (112 lb). Up with therapy Discharge home with home health Diet - Regular diet Follow up - in 2 weeks Activity - WBAT Disposition - Home Condition Upon Discharge - Good D/C Meds - See DC Summary DVT Prophylaxis - Xarelto  Arlee Muslim, PA-C Orthopaedic Surgery 01/12/2015, 7:57 AM

## 2015-01-12 NOTE — Progress Notes (Signed)
Physical Therapy Treatment Patient Details Name: Erin Carroll MRN: [id number] DOB: 07/16/1946 Today's Date: 01/12/2015    History of Present Illness Pt is a 69 year old female s/p L TKA    PT Comments    Pt ambulating good distance, performed exercises and practiced steps.  Pt provided with HEP handout.  Pt had no further questions and plans to d/c home today with spouse and sister.  Follow Up Recommendations  Home health PT     Equipment Recommendations  None recommended by PT    Recommendations for Other Services       Precautions / Restrictions Precautions Precautions: Knee Required Braces or Orthoses: Knee Immobilizer - Left Knee Immobilizer - Left: Discontinue once straight leg raise with < 10 degree lag Restrictions Weight Bearing Restrictions: No Other Position/Activity Restrictions: WBAT    Mobility  Bed Mobility   Bed Mobility: Supine to Sit     Supine to sit: Supervision     General bed mobility comments: pt up in recliner on arrival  Transfers Overall transfer level: Needs assistance Equipment used: Rolling walker (2 wheeled) Transfers: Sit to/from Stand Sit to Stand: Supervision         General transfer comment: verbal cues for hand placement.  Ambulation/Gait Ambulation/Gait assistance: Supervision Ambulation Distance (Feet): 200 Feet Assistive device: Rolling walker (2 wheeled) Gait Pattern/deviations: Step-through pattern;Antalgic;Trunk flexed     General Gait Details: verbal cues for sequence, RW distance, step length, posture   Stairs Stairs: Yes Stairs assistance: Min guard Stair Management: Step to pattern;Forwards;With walker Number of Stairs: 1 General stair comments: verbal cues for safety and sequence, performed twice  Wheelchair Mobility    Modified Rankin (Stroke Patients Only)       Balance                                    Cognition Arousal/Alertness: Awake/alert Behavior During Therapy:  WFL for tasks assessed/performed Overall Cognitive Status: Within Functional Limits for tasks assessed                      Exercises Total Joint Exercises Ankle Circles/Pumps: AROM;Both;15 reps Quad Sets: AROM;Both;15 reps Short Arc QuadSinclair Ship;Left;15 reps Heel Slides: AAROM;Left;15 reps Hip ABduction/ADduction: Left;15 reps;AROM Straight Leg Raises: AAROM;Left;10 reps    General Comments        Pertinent Vitals/Pain Pain Assessment: 0-10 Pain Score: 5  Pain Location: L knee Pain Descriptors / Indicators: Sore;Aching Pain Intervention(s): Limited activity within patient's tolerance;Premedicated before session;Repositioned;Ice applied;Monitored during session    Home Living                      Prior Function            PT Goals (current goals can now be found in the care plan section) Progress towards PT goals: Progressing toward goals    Frequency  7X/week    PT Plan Current plan remains appropriate    Co-evaluation             End of Session Equipment Utilized During Treatment: Left knee immobilizer Activity Tolerance: Patient tolerated treatment well Patient left: in chair;with call bell/phone within reach     Time: 0941-1011 PT Time Calculation (min) (ACUTE ONLY): 30 min  Charges:  $Gait Training: 8-22 mins $Therapeutic Exercise: 8-22 mins  G CodesTrena Platt 01/15/15, 10:43 AM Carmelia Bake, PT, DPT 2015/01/15 Pager: [phone]

## 2015-01-26 ENCOUNTER — Ambulatory Visit: Payer: Medicare Other | Attending: Orthopedic Surgery | Admitting: Physical Therapy

## 2015-01-26 ENCOUNTER — Encounter: Payer: Self-pay | Admitting: Physical Therapy

## 2015-01-26 DIAGNOSIS — M7989 Other specified soft tissue disorders: Secondary | ICD-10-CM

## 2015-01-26 DIAGNOSIS — M25561 Pain in right knee: Secondary | ICD-10-CM

## 2015-01-26 DIAGNOSIS — Z4789 Encounter for other orthopedic aftercare: Secondary | ICD-10-CM | POA: Diagnosis present

## 2015-01-26 DIAGNOSIS — M25562 Pain in left knee: Secondary | ICD-10-CM | POA: Insufficient documentation

## 2015-01-26 DIAGNOSIS — M25661 Stiffness of right knee, not elsewhere classified: Secondary | ICD-10-CM

## 2015-01-26 DIAGNOSIS — M25662 Stiffness of left knee, not elsewhere classified: Secondary | ICD-10-CM | POA: Insufficient documentation

## 2015-01-26 DIAGNOSIS — Z96652 Presence of left artificial knee joint: Secondary | ICD-10-CM | POA: Insufficient documentation

## 2015-01-26 NOTE — Therapy (Signed)
[address] [place], [place], [address] Phone: [phone]   Fax:  [phone]  Physical Therapy Evaluation  Patient Details  Name: Erin Carroll MRN: [id number] Date of Birth: [birth year] Referring Provider:  [name], MD  Encounter Date: 01/26/2015      PT End of Session - 01/26/15 1132    Visit Number 1   Date for PT Re-Evaluation 03/27/15   PT Start Time 1053      Past Medical History  Diagnosis Date  . Thyroid disease     problems with thyroid  . Alcoholism /alcohol abuse     sober since 1996  . Hypothyroidism   . Arthritis     hands, knees.    Past Surgical History  Procedure Laterality Date  . Right knee arthroscopy  1987/1991  . Shoulder arthroscopy  2004  . Breast biopsy  1977    left  . Tonsillectomy    . Metacarpophalangeal joint arthroplasty Bilateral     bilateral thumbs- Gramig  . Finger surgery Left 12-31-13    left middle finger cyst -aspirated  . Excision/release bursa hip Left 01/08/2014    Procedure: LEFT HIP BURSECTOMY WITH GLUTEAL TENDON REPAIR;  Surgeon: [name], MD;  Location: [facility];  Service: Orthopedics;  Laterality: Left;  With Anchors  . Rectal prolapse repair  MAY 2015  . 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  Visit Diagnosis:  Right knee pain - Plan: PT plan of care cert/re-cert  Knee stiffness, right - Plan: PT plan of care cert/re-cert  Swelling of limb - Plan: PT plan of care cert/re-cert      Subjective Assessment - 01/26/15 1056    Symptoms left knee pain after TKR on left 01/10/15.  3 day hospital stay.  Has some significant pain and decreased ROM   Pertinent History left hip tendon repair 1 year ago   Limitations Standing;Walking;House hold activities   How long can you stand comfortably? 5 minutes   How long can you walk comfortably? 5 minutes   Patient Stated Goals less pain and walk normally   Currently in Pain? Yes   Pain Score 2    Pain Location Knee   Pain Orientation Left   Pain Descriptors / Indicators Aching   Pain Type Surgical pain   Pain Onset More than a month ago   Pain Frequency Constant   Aggravating Factors  any standing or walking   Pain Relieving Factors ice rest and pain meds   Effect of Pain on Daily Activities limited with all ADL's due to pain          [facility] PT Assessment - 01/26/15 0001    Assessment   Medical Diagnosis left TKR   Onset Date 01/10/15   Next MD Visit 02/15/15   Prior Therapy Home PT   Precautions   Precautions None   Restrictions   Weight Bearing Restrictions No   Balance Screen   Has the patient fallen in the past 6 months No   Has the patient had a decrease in activity level because of a fear of falling?  No   Is the patient reluctant to leave their home because of a fear of falling?  No   Home Environment  Living Enviornment Private residence   Living Arrangements Spouse/significant other   Type of [place] to enter   Prior Function   Level of Independence Independent with basic ADLs;Independent with homemaking with ambulation   Vocation Retired   Leisure yoga and pilates 2x/week   AROM   Overall AROM  --  AROM of the left knee 25-83 degrees flexion   PROM   Overall PROM Comments PROM of the left knee 10-90 degrees flexion   Strength   Overall Strength Comments 3+/5 with some pain   Palpation   Palpation steristrips distally, scar is healing, some puckering proximally, she is slightly tender iwth slight warmth   Ambulation/Gait   Stairs --  Stairs one at a time   Gait Comments uses a SPC, very slow, very stiff legged on the left, minimal bending with gait                  [facility] Adult PT Treatment/Exercise - 02/14/15 0001    Knee/Hip  Exercises: Aerobic   Elliptical NuStep Lvl 4 6 minutes   Modalities   Modalities --  Vaso to left knee medium pressure   Knee/Hip Exercises: Machines for Strengthening   Cybex Knee Extension 5# 2x10   Cybex Knee Flexion 20# 2x10                PT Education - 02-14-15 1132    Education provided Yes   Education Details HEP for low load long duration stretches of flexion and extension   Person(s) Educated Patient   Methods Explanation;Demonstration;Handout   Comprehension Verbalized understanding;Returned demonstration          PT Short Term Goals - 02/14/2015 1134    PT SHORT TERM GOAL #1   Title independent with HEP   Time 2   Period Weeks   Status New           PT Long Term Goals - 2015-02-14 1135    PT LONG TERM GOAL #1   Title independent with RICE   Time 8   Period Weeks   Status New   PT LONG TERM GOAL #2   Title increase AROM of the left knee to 5-115 degrees flexion   Time 8   Period Weeks   Status New   PT LONG TERM GOAL #3   Title decrease pain 50%   Time 8   Period Weeks   Status New   PT LONG TERM GOAL #4   Title walk all distances without assistive device, stairs reciprocally   Time 8   Period Weeks   Status New               Plan - Feb 14, 2015 1133    Clinical Impression Statement Patient underwent a left TKR on 01/10/15.  She came in today with stiffness and swelling, with limited ROM.  Poor gait with stiff leg during walking   Pt will benefit from skilled therapeutic intervention in order to improve on the following deficits Abnormal gait;Decreased endurance;Decreased range of motion;Difficulty walking;Increased edema;Decreased scar mobility;Pain;Impaired flexibility   Rehab Potential Good   PT Frequency 3x / week   PT Duration 4 weeks   PT Treatment/Interventions Cryotherapy;Electrical Stimulation;Ultrasound;Gait training;Stair training;Functional mobility training;Balance training;Therapeutic exercise;Therapeutic  activities;Patient/family education;Manual techniques;Passive range of motion;Scar mobilization   PT Next Visit Plan add exercises   Consulted and Agree with Plan of Care Patient          G-Codes - 14-Feb-2015 1136  Functional Assessment Tool Used FOTO   Functional Limitation Mobility: Walking and moving around   Mobility: Walking and Moving Around Current Status [phone]) At least 40 percent but less than 60 percent impaired, limited or restricted   Mobility: Walking and Moving Around Goal Status [phone]) At least 40 percent but less than 60 percent impaired, limited or restricted       Problem List Patient Active Problem List   Diagnosis Date Noted  . OA (osteoarthritis) of knee 01/10/2015  . Bursitis, hip 01/08/2014  . Ecchymosis 12/29/2013  . Annual physical exam 11/14/2012  . FECAL INCONTINENCE 03/14/2010  . PERSONAL HX COLONIC POLYPS 03/14/2010  . ARTHRITIS, HANDS, BILATERAL 11/15/2009  . ARTHRITIS, KNEES, BILATERAL 12/07/2008  . HYPOTHYROIDISM 09/28/2008  . 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 OSTEOPENIA 09/28/2008    Sumner Boast, PT 01/26/2015, 11:38 AM  [address] [place], [place], [address] Phone: [phone]   Fax:  [phone]

## 2015-01-27 ENCOUNTER — Ambulatory Visit: Payer: Medicare Other | Admitting: Physical Therapy

## 2015-01-27 ENCOUNTER — Encounter: Payer: Self-pay | Admitting: Physical Therapy

## 2015-01-27 DIAGNOSIS — M7989 Other specified soft tissue disorders: Secondary | ICD-10-CM

## 2015-01-27 DIAGNOSIS — M25561 Pain in right knee: Secondary | ICD-10-CM

## 2015-01-27 DIAGNOSIS — Z4789 Encounter for other orthopedic aftercare: Secondary | ICD-10-CM | POA: Diagnosis not present

## 2015-01-27 DIAGNOSIS — M25661 Stiffness of right knee, not elsewhere classified: Secondary | ICD-10-CM

## 2015-01-27 NOTE — Therapy (Signed)
[place] [place], [place], [address] Phone: [phone]   Fax:  [phone]  Physical Therapy Treatment  Patient Details  Name: Erin Carroll MRN: [id number] Date of Birth: 11/25/1946 Referring Provider:  [name], MD  Encounter Date: 01/27/2015      PT End of Session - 01/27/15 1156    Visit Number 2   Date for PT Re-Evaluation 03/27/15   PT Start Time 1100   PT Stop Time 1204   PT Time Calculation (min) 64 min   Activity Tolerance Patient tolerated treatment well   Behavior During Therapy [facility] for tasks assessed/performed      Past Medical History  Diagnosis Date  . Thyroid disease     problems with thyroid  . Alcoholism /alcohol abuse     sober since 1996  . Hypothyroidism   . Arthritis     hands, knees.    Past Surgical History  Procedure Laterality Date  . Right knee arthroscopy  1987/1991  . Shoulder arthroscopy  2004  . Breast biopsy  1977    left  . Tonsillectomy    . Metacarpophalangeal joint arthroplasty Bilateral     bilateral thumbs- Gramig  . Finger surgery Left 12-31-13    left middle finger cyst -aspirated  . Excision/release bursa hip Left 01/08/2014    Procedure: LEFT HIP BURSECTOMY WITH GLUTEAL TENDON REPAIR;  Surgeon: [name], MD;  Location: [facility];  Service: Orthopedics;  Laterality: Left;  With Anchors  . Rectal prolapse repair  MAY 2015  . 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  Visit Diagnosis:  Right knee pain  Knee stiffness, right  Swelling of limb      Subjective Assessment - 01/27/15 1100    Symptoms Doing good.   Currently in Pain? No/denies                    [facility] Adult PT Treatment/Exercise - 01/27/15 0001    Exercises   Exercises  Knee/Hip   Knee/Hip Exercises: Stretches   Active Hamstring Stretch 60 seconds  with quad set   Quad Stretch 60 seconds  AAROM using right with hamstring contraction   Knee/Hip Exercises: Aerobic   Stationary Bike 6 minutes  full revolutions position 4   Elliptical nustep  level 5x87min   Knee/Hip Exercises: Machines for Strengthening   Cybex Knee Extension 5#  3x10   Cybex Knee Flexion 25#  2x15   Knee/Hip Exercises: Supine   Other Supine Knee Exercises SLR with abd/add  10 reps   Knee/Hip Exercises: Machines for Strengthening   Cybex Leg Press no wt for ROM                PT Education - 01/27/15 1158    Education provided Yes   Education Details Patient shown then performed AAROM with hamstring pumps to increase ROM, then shown quad set with hip ext on stool for extension.   Person(s) Educated Patient   Methods Explanation;Demonstration;Tactile cues   Comprehension Verbalized understanding;Returned demonstration          PT Short Term Goals - 01/26/15 1134    PT SHORT TERM GOAL #1   Title independent with HEP   Time 2   Period Weeks  Status New           PT Long Term Goals - 01/27/15 1104    PT LONG TERM GOAL #1   Title independent with RICE   Time 8   Period Weeks   Status New   PT LONG TERM GOAL #2   Title increase AROM of the left knee to 5-115 degrees flexion   Time 8   Period Weeks   Status New   PT LONG TERM GOAL #3   Title decrease pain 50%   Time 8   Period Weeks   Status Achieved   PT LONG TERM GOAL #4   Title walk all distances without assistive device, stairs reciprocally   Time 8   Period Weeks   Status New               Plan - 01/27/15 1151    Clinical Impression Statement Patient able to actively flex knee to 100 degrees after exercise and performing self stretching techniques aquired today.  Gait has decreased knee flex producing circumduction.  She tends to hip hike left hip during exercises.  Manually tested  3+/5 for left hip flexor.   Pt will benefit from skilled therapeutic intervention in order to improve on the following deficits Abnormal gait;Decreased endurance;Decreased range of motion;Difficulty walking;Increased edema;Decreased scar mobility;Pain;Impaired flexibility;Decreased strength   Rehab Potential Good   PT Frequency 3x / week   PT Duration 4 weeks   PT Treatment/Interventions Cryotherapy;Electrical Stimulation;Ultrasound;Gait training;Stair training;Functional mobility training;Balance training;Therapeutic exercise;Therapeutic activities;Patient/family education;Manual techniques;Passive range of motion;Scar mobilization   PT Next Visit Plan Continue to strengthen.   Consulted and Agree with Plan of Care Patient          G-Codes - 02/02/15 1136    Functional Assessment Tool Used FOTO   Functional Limitation Mobility: Walking and moving around   Mobility: Walking and Moving Around Current Status [phone]) At least 40 percent but less than 60 percent impaired, limited or restricted   Mobility: Walking and Moving Around Goal Status [phone]) At least 40 percent but less than 60 percent impaired, limited or restricted      Problem List Patient Active Problem List   Diagnosis Date Noted  . OA (osteoarthritis) of knee 01/10/2015  . Bursitis, hip 01/08/2014  . Ecchymosis 12/29/2013  . Annual physical exam 11/14/2012  . FECAL INCONTINENCE 03/14/2010  . PERSONAL HX COLONIC POLYPS 03/14/2010  . ARTHRITIS, HANDS, BILATERAL 11/15/2009  . ARTHRITIS, KNEES, BILATERAL 12/07/2008  . HYPOTHYROIDISM 09/28/2008  . 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 OSTEOPENIA 09/28/2008    Emmajean Ratledge  PTA 01/27/2015, 12:02 PM  [address] [place], [place], [address] Phone: [phone]   Fax:  [phone]

## 2015-01-31 ENCOUNTER — Ambulatory Visit: Payer: Medicare Other | Admitting: Physical Therapy

## 2015-01-31 ENCOUNTER — Encounter: Payer: Self-pay | Admitting: Physical Therapy

## 2015-01-31 DIAGNOSIS — M7989 Other specified soft tissue disorders: Secondary | ICD-10-CM

## 2015-01-31 DIAGNOSIS — M25561 Pain in right knee: Secondary | ICD-10-CM

## 2015-01-31 DIAGNOSIS — Z4789 Encounter for other orthopedic aftercare: Secondary | ICD-10-CM | POA: Diagnosis not present

## 2015-01-31 DIAGNOSIS — M25661 Stiffness of right knee, not elsewhere classified: Secondary | ICD-10-CM

## 2015-01-31 NOTE — Therapy (Signed)
[place] [address] [place], [place], [address] Phone: [phone]   Fax:  [phone]  Physical Therapy Treatment  Patient Details  Name: Erin Carroll MRN: [id number] Date of Birth: 04-17-1946 Referring Provider:  [name], MD  Encounter Date: 01/31/2015      PT End of Session - 01/31/15 0920    Visit Number 3   Date for PT Re-Evaluation 03/27/15   PT Start Time 0835   PT Stop Time 0940   PT Time Calculation (min) 65 min      Past Medical History  Diagnosis Date  . Thyroid disease     problems with thyroid  . Alcoholism /alcohol abuse     sober since 1996  . Hypothyroidism   . Arthritis     hands, knees.    Past Surgical History  Procedure Laterality Date  . Right knee arthroscopy  1987/1991  . Shoulder arthroscopy  2004  . Breast biopsy  1977    left  . Tonsillectomy    . Metacarpophalangeal joint arthroplasty Bilateral     bilateral thumbs- Gramig  . Finger surgery Left 12-31-13    left middle finger cyst -aspirated  . Excision/release bursa hip Left 01/08/2014    Procedure: LEFT HIP BURSECTOMY WITH GLUTEAL TENDON REPAIR;  Surgeon: [name], MD;  Location: [facility];  Service: Orthopedics;  Laterality: Left;  With Anchors  . Rectal prolapse repair  MAY 2015  . 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  Visit Diagnosis:  Right knee pain  Knee stiffness, right  Swelling of limb      Subjective Assessment - 01/31/15 0836    Symptoms I am really stiff over the weekend, did not ice it today   Pertinent History left hip tendon repair 1 year ago   Limitations Standing;Walking;House hold activities   How long can you stand comfortably? 5 minutes   How long can you walk comfortably? 5 minutes   Patient  Stated Goals no pain and walk without difficulty   Currently in Pain? Yes   Pain Score 2    Pain Location Knee   Pain Orientation Left   Pain Descriptors / Indicators Tightness;Aching   Pain Type Surgical pain   Pain Relieving Factors ice helps   Effect of Pain on Daily Activities limited with walking and DL's                    [facility] Adult PT Treatment/Exercise - 01/31/15 0001    High Level Balance   High Level Balance Comments ball kicks   Knee/Hip Exercises: Stretches   Knee: Self-Stretch Limitations step lunge stretch   Gastroc Stretch --  2" block standing stretch   Knee/Hip Exercises: Aerobic   Stationary Bike 6 minutes   Elliptical Nustep L5 6 minutes   Knee/Hip Exercises: Machines for Strengthening   Cybex Knee Extension 5# 2x15   Cybex Knee Flexion 25# 2x15   Knee/Hip Exercises: Standing   Step Down Limitations 4" step up and downs with cues and assist   Functional Squat Limitations squat butt touches   Walking with Sports Cord all directions   Modalities   Modalities --  Vaso medium pressure to left knee   Manual Therapy   Manual  Therapy --  PROM of the left knee for flexion and extension                  PT Short Term Goals - 01/26/15 1134    PT SHORT TERM GOAL #1   Title independent with HEP   Time 2   Period Weeks   Status New           PT Long Term Goals - 01/27/15 1104    PT LONG TERM GOAL #1   Title independent with RICE   Time 8   Period Weeks   Status New   PT LONG TERM GOAL #2   Title increase AROM of the left knee to 5-115 degrees flexion   Time 8   Period Weeks   Status New   PT LONG TERM GOAL #3   Title decrease pain 50%   Time 8   Period Weeks   Status Achieved   PT LONG TERM GOAL #4   Title walk all distances without assistive device, stairs reciprocally   Time 8   Period Weeks   Status New               Plan - 01/31/15 0920    Clinical Impression Statement Reports stiffness mostly, pain a  4/10.  When walking she is unable to straighten the leg.   Pt will benefit from skilled therapeutic intervention in order to improve on the following deficits Abnormal gait;Decreased endurance;Decreased range of motion;Difficulty walking;Increased edema;Decreased scar mobility;Pain;Impaired flexibility;Decreased strength   Rehab Potential Good   PT Frequency 3x / week   PT Duration 4 weeks   PT Treatment/Interventions Cryotherapy;Electrical Stimulation;Ultrasound;Gait training;Stair training;Functional mobility training;Balance training;Therapeutic exercise;Therapeutic activities;Patient/family education;Manual techniques;Passive range of motion;Scar mobilization   Consulted and Agree with Plan of Care Patient        Problem List Patient Active Problem List   Diagnosis Date Noted  . OA (osteoarthritis) of knee 01/10/2015  . Bursitis, hip 01/08/2014  . Ecchymosis 12/29/2013  . Annual physical exam 11/14/2012  . FECAL INCONTINENCE 03/14/2010  . PERSONAL HX COLONIC POLYPS 03/14/2010  . ARTHRITIS, HANDS, BILATERAL 11/15/2009  . ARTHRITIS, KNEES, BILATERAL 12/07/2008  . HYPOTHYROIDISM 09/28/2008  . 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 OSTEOPENIA 09/28/2008    Sumner Boast, PT 01/31/2015, 9:22 AM  [address] [place], [place], [address] Phone: [phone]   Fax:  [phone]

## 2015-02-02 ENCOUNTER — Ambulatory Visit: Payer: Medicare Other | Attending: Orthopedic Surgery | Admitting: Physical Therapy

## 2015-02-02 ENCOUNTER — Encounter: Payer: Self-pay | Admitting: Physical Therapy

## 2015-02-02 DIAGNOSIS — Z96652 Presence of left artificial knee joint: Secondary | ICD-10-CM | POA: Diagnosis not present

## 2015-02-02 DIAGNOSIS — M7989 Other specified soft tissue disorders: Secondary | ICD-10-CM | POA: Insufficient documentation

## 2015-02-02 DIAGNOSIS — M25662 Stiffness of left knee, not elsewhere classified: Secondary | ICD-10-CM | POA: Diagnosis not present

## 2015-02-02 DIAGNOSIS — M25661 Stiffness of right knee, not elsewhere classified: Secondary | ICD-10-CM

## 2015-02-02 DIAGNOSIS — M25562 Pain in left knee: Secondary | ICD-10-CM | POA: Insufficient documentation

## 2015-02-02 DIAGNOSIS — Z4789 Encounter for other orthopedic aftercare: Secondary | ICD-10-CM | POA: Diagnosis present

## 2015-02-02 DIAGNOSIS — M25561 Pain in right knee: Secondary | ICD-10-CM

## 2015-02-02 NOTE — Therapy (Signed)
[place], [place], [address] Phone: [phone]   Fax:  [phone]  Physical Therapy Treatment  Patient Details  Name: Erin Carroll MRN: [id number] Date of Birth: 1946/01/20 Referring Provider:  [name], MD  Encounter Date: 02/02/2015      PT End of Session - 02/02/15 1111    Visit Number 4   Date for PT Re-Evaluation 03/27/15   PT Start Time 1013   PT Stop Time 1111   PT Time Calculation (min) 58 min   Activity Tolerance Patient tolerated treatment well   Behavior During Therapy [facility] for tasks assessed/performed      Past Medical History  Diagnosis Date  . Thyroid disease     problems with thyroid  . Alcoholism /alcohol abuse     sober since 1996  . Hypothyroidism   . Arthritis     hands, knees.    Past Surgical History  Procedure Laterality Date  . Right knee arthroscopy  1987/1991  . Shoulder arthroscopy  2004  . Breast biopsy  1977    left  . Tonsillectomy    . Metacarpophalangeal joint arthroplasty Bilateral     bilateral thumbs- Gramig  . Finger surgery Left 12-31-13    left middle finger cyst -aspirated  . Excision/release bursa hip Left 01/08/2014    Procedure: LEFT HIP BURSECTOMY WITH GLUTEAL TENDON REPAIR;  Surgeon: [name], MD;  Location: [facility];  Service: Orthopedics;  Laterality: Left;  With Anchors  . Rectal prolapse repair  MAY 2015  . 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  Visit Diagnosis:  Right knee pain  Knee stiffness, right  Swelling of limb      Subjective Assessment - 02/02/15 1013    Symptoms A little stiff.   Currently in Pain? Yes   Pain Score 1           [facility] PT Assessment - 02/02/15 0001    ROM / Strength   AROM / PROM / Strength AROM    AROM   AROM Assessment Site Knee   Right/Left Knee Left   Left Knee Extension 10   Left Knee Flexion 99                  [facility] Adult PT Treatment/Exercise - 02/02/15 0001    Ambulation/Gait   Stairs Yes   Stairs Assistance 7: Independent   Stair Management Technique One rail Right   Number of Stairs 36   Exercises   Exercises Knee/Hip   Knee/Hip Exercises: Stretches   Passive Hamstring Stretch Other (comment)  3 minutes 5# weight   Quad Stretch 30 seconds;2 reps   Hip Flexor Stretch 2 reps;30 seconds   Knee/Hip Exercises: Aerobic   Stationary Bike 6 minutes   Elliptical Nustep L5 6 minutes   Knee/Hip Exercises: Machines for Strengthening   Cybex Knee Extension 5#  2x10 at EOR   Cybex Leg Press 20#  single leg with switch 10 reps   Knee/Hip Exercises: Standing   Other Standing Knee Exercises manual treadmill   push then pull 10 reps each   Modalities   Modalities Cryotherapy   Cryotherapy   Number Minutes Cryotherapy 15 Minutes   Cryotherapy Location Knee   Type of  Cryotherapy Other (comment)  Vaso                  PT Short Term Goals - 02/02/15 1018    PT SHORT TERM GOAL #1   Title independent with HEP   Time 2   Period Weeks   Status Achieved           PT Long Term Goals - 02/02/15 1057    PT LONG TERM GOAL #1   Title independent with RICE   Time 8   Period Weeks   Status Achieved   PT LONG TERM GOAL #2   Title increase AROM of the left knee to 5-115 degrees flexion   Time 8   Period Weeks   Status On-going   PT LONG TERM GOAL #3   Title decrease pain 50%   Time 8   Period Weeks   Status Achieved   PT LONG TERM GOAL #4   Title walk all distances without assistive device, stairs reciprocally   Time 8   Period Weeks   Status Achieved               Plan - 02/02/15 1059    Clinical Impression Statement Able to manuever stairs reciprocally.  Decreased pain.  Continues to lack TKE.  Tight hip flexor and quad on  LLE.   Pt will benefit from skilled therapeutic intervention in order to improve on the following deficits Abnormal gait;Decreased endurance;Decreased range of motion;Difficulty walking;Increased edema;Decreased scar mobility;Pain;Impaired flexibility;Decreased strength   Rehab Potential Good   PT Frequency 3x / week   PT Duration 4 weeks   PT Treatment/Interventions Cryotherapy;Electrical Stimulation;Ultrasound;Gait training;Stair training;Functional mobility training;Balance training;Therapeutic exercise;Therapeutic activities;Patient/family education;Manual techniques;Passive range of motion;Scar mobilization   PT Next Visit Plan Continue to strengthen and increase ROM for improved gait.   Consulted and Agree with Plan of Care Patient        Problem List Patient Active Problem List   Diagnosis Date Noted  . OA (osteoarthritis) of knee 01/10/2015  . Bursitis, hip 01/08/2014  . Ecchymosis 12/29/2013  . Annual physical exam 11/14/2012  . FECAL INCONTINENCE 03/14/2010  . PERSONAL HX COLONIC POLYPS 03/14/2010  . ARTHRITIS, HANDS, BILATERAL 11/15/2009  . ARTHRITIS, KNEES, BILATERAL 12/07/2008  . HYPOTHYROIDISM 09/28/2008  . 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 OSTEOPENIA 09/28/2008    Gerarda Conklin PTA 02/02/2015, 11:13 AM  [place] [address] [place], [place], [address] Phone: [phone]   Fax:  [phone]

## 2015-02-04 ENCOUNTER — Ambulatory Visit: Payer: Medicare Other | Admitting: Physical Therapy

## 2015-02-04 DIAGNOSIS — Z4789 Encounter for other orthopedic aftercare: Secondary | ICD-10-CM | POA: Diagnosis not present

## 2015-02-04 DIAGNOSIS — M25561 Pain in right knee: Secondary | ICD-10-CM

## 2015-02-04 DIAGNOSIS — M25661 Stiffness of right knee, not elsewhere classified: Secondary | ICD-10-CM

## 2015-02-04 NOTE — Therapy (Signed)
[place], [place], [address] Phone: [phone]   Fax:  [phone]  Physical Therapy Treatment  Patient Details  Name: Erin Carroll MRN: [id number] Date of Birth: 1946-08-02 Referring Provider:  [name], MD  Encounter Date: 02/04/2015      PT End of Session - 02/04/15 1005    Visit Number 5   PT Start Time 6433   PT Stop Time 1020   PT Time Calculation (min) 55 min      Past Medical History  Diagnosis Date  . Thyroid disease     problems with thyroid  . Alcoholism /alcohol abuse     sober since 1996  . Hypothyroidism   . Arthritis     hands, knees.    Past Surgical History  Procedure Laterality Date  . Right knee arthroscopy  1987/1991  . Shoulder arthroscopy  2004  . Breast biopsy  1977    left  . Tonsillectomy    . Metacarpophalangeal joint arthroplasty Bilateral     bilateral thumbs- Gramig  . Finger surgery Left 12-31-13    left middle finger cyst -aspirated  . Excision/release bursa hip Left 01/08/2014    Procedure: LEFT HIP BURSECTOMY WITH GLUTEAL TENDON REPAIR;  Surgeon: [name], MD;  [facility];  Service: Orthopedics;  Laterality: Left;  With Anchors  . Rectal prolapse repair  MAY 2015  . 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  Visit Diagnosis:  Knee stiffness, right  Right knee pain      Subjective Assessment - 02/04/15 0925    Symptoms more stiffness than pain. MD 3/15   Currently in Pain? Yes   Pain Score 1    Pain Location Knee   Pain Orientation Left   Pain Descriptors / Indicators Tightness                    [facility] Adult PT Treatment/Exercise - 02/04/15 0001    Knee/Hip Exercises: Aerobic   Stationary Bike 6 minutes   Elliptical Nustep  L5 6 minutes   Knee/Hip Exercises: Machines for Strengthening   Cybex Knee Extension 5# left only 2 sets 10   Cybex Knee Flexion 15# left only 2 sets 10   Cybex Leg Press 30# 2 sets 10, 20# left only 10 times. Calf Raises 30 # 2 sets 10   Knee/Hip Exercises: Standing   Other Standing Knee Exercises manual treadmill   push then pull 15 reps each   Knee/Hip Exercises: Seated   Other Seated Knee Exercises fitter 2 blue flexion and ext 2 sets 15 ( assistance with flexion yo increase ROM)   Knee/Hip Exercises: Supine   Terminal Knee Extension Strengthening;Left;2 sets;10 reps;Theraband  green   Theraband Level (Terminal Knee Extension) Level 3 (Green)   Modalities   Modalities Cryotherapy   Cryotherapy   Number Minutes Cryotherapy 15 Minutes   Cryotherapy Location Knee   Type of Cryotherapy Other (comment)  Vaso   Manual Therapy   Manual Therapy --  PROM and belt mobs to increase ext       Showed pt TKE ex at home ball vs wall, step up and calf raises on step, Demo and verb.  PT Short Term Goals - 02/02/15 1018    PT SHORT TERM GOAL #1   Title independent with HEP   Time 2   Period Weeks   Status Achieved           PT Long Term Goals - 02/02/15 1057    PT LONG TERM GOAL #1   Title independent with RICE   Time 8   Period Weeks   Status Achieved   PT LONG TERM GOAL #2   Title increase AROM of the left knee to 5-115 degrees flexion   Time 8   Period Weeks   Status On-going   PT LONG TERM GOAL #3   Title decrease pain 50%   Time 8   Period Weeks   Status Achieved   PT LONG TERM GOAL #4   Title walk all distances without assistive device, stairs reciprocally   Time 8   Period Weeks   Status Achieved               Plan - 02/04/15 1006    Clinical Impression Statement pt with quad weakness and decreased TKE.         Problem List Patient Active Problem List   Diagnosis Date Noted  . OA (osteoarthritis) of knee 01/10/2015  .  Bursitis, hip 01/08/2014  . Ecchymosis 12/29/2013  . Annual physical exam 11/14/2012  . FECAL INCONTINENCE 03/14/2010  . PERSONAL HX COLONIC POLYPS 03/14/2010  . ARTHRITIS, HANDS, BILATERAL 11/15/2009  . ARTHRITIS, KNEES, BILATERAL 12/07/2008  . HYPOTHYROIDISM 09/28/2008  . 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 OSTEOPENIA 09/28/2008    Delio Slates,ANGIE,PTA 02/04/2015, 10:11 AM  [place] [address] [address] [place], [place], [address] Phone: [phone]   Fax:  [phone]

## 2015-02-07 ENCOUNTER — Ambulatory Visit: Payer: Medicare Other | Admitting: Physical Therapy

## 2015-02-07 ENCOUNTER — Encounter: Payer: Self-pay | Admitting: Physical Therapy

## 2015-02-07 DIAGNOSIS — Z4789 Encounter for other orthopedic aftercare: Secondary | ICD-10-CM | POA: Diagnosis not present

## 2015-02-07 DIAGNOSIS — M7989 Other specified soft tissue disorders: Secondary | ICD-10-CM

## 2015-02-07 DIAGNOSIS — M25661 Stiffness of right knee, not elsewhere classified: Secondary | ICD-10-CM

## 2015-02-07 DIAGNOSIS — M25561 Pain in right knee: Secondary | ICD-10-CM

## 2015-02-07 NOTE — Therapy (Signed)
Erin Carroll, [place], [address] Phone: [phone]   Fax:  [phone]  Physical Therapy Treatment  Patient Details  Name: Erin Carroll MRN: [id number] Date of Birth: Apr 18, 1946 Referring Provider:  Gaynelle Arabian, MD  Encounter Date: 02/07/2015      PT End of Session - 02/07/15 1601    Visit Number 6   Date for PT Re-Evaluation 03/27/15   PT Start Time 5427   PT Stop Time 1522   PT Time Calculation (min) 70 min      Past Medical History  Diagnosis Date  . Thyroid disease     problems with thyroid  . Alcoholism /alcohol abuse     sober since 1996  . Hypothyroidism   . Arthritis     hands, knees.    Past Surgical History  Procedure Laterality Date  . Right knee arthroscopy  1987/1991  . Shoulder arthroscopy  2004  . Breast biopsy  1977    left  . Tonsillectomy    . Metacarpophalangeal joint arthroplasty Bilateral     bilateral thumbs- Gramig  . Finger surgery Left 12-31-13    left middle finger cyst -aspirated  . Excision/release bursa hip Left 01/08/2014    Procedure: LEFT HIP BURSECTOMY WITH GLUTEAL TENDON REPAIR;  Surgeon: [name], MD;  [facility];  Service: Orthopedics;  Laterality: Left;  With Anchors  . Rectal prolapse repair  MAY 2015  . 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  Visit Diagnosis:  Knee stiffness, right  Right knee pain  Swelling of limb      Subjective Assessment - 02/07/15 1413    Symptoms Doing ok.   Currently in Pain? Yes   Pain Score 1    Pain Location Knee   Pain Orientation Left   Pain Type Surgical pain   Pain Onset More than a month ago   Multiple Pain Sites No          [facility] PT Assessment - 02/07/15 0001    ROM / Strength   AROM / PROM /  Strength AROM;Strength   AROM   AROM Assessment Site Knee   Right/Left Knee Left   Left Knee Extension 6   Left Knee Flexion 107   Strength   Strength Assessment Site Knee;Hip   Right/Left Hip Left   Left Hip Flexion 4+/5   Left Hip ABduction 4/5   Left Hip ADduction 4/5   Right/Left Knee Left   Left Knee Flexion 4/5   Left Knee Extension 4/5                  [facility] Adult PT Treatment/Exercise - 02/07/15 0001    Ambulation/Gait   Ambulation/Gait Yes   Ambulation Distance (Feet) 200 Feet   Assistive device None   Gait Pattern Decreased hip/knee flexion - left;Left hip hike   Ambulation Surface Level;Indoor   Gait velocity - backwards WFL's  used to promote fwd mechanics   Stairs No   Gait Comments circumduction of LLE to advance by RLE   Exercises   Exercises Knee/Hip   Knee/Hip Exercises: Aerobic   Stationary Bike 6 minutes   Elliptical 4 minutes  75fwd/2bk   Knee/Hip Exercises: Machines for Strengthening   Cybex Knee Extension  10#  3x10   Cybex Knee Flexion 35#  2x15   Knee/Hip Exercises: Standing   Other Standing Knee Exercises 4 way hip  red theraband 15 reps bilateral   Other Standing Knee Exercises ball roll    Modalities   Modalities Cryotherapy   Cryotherapy   Number Minutes Cryotherapy 15 Minutes   Cryotherapy Location Knee   Type of Cryotherapy Other (comment)  vaso                PT Education - 02/07/15 1442    Education provided Yes   Education Details Patient given red theraband and hip exercises for HEP   Person(s) Educated Patient   Methods Explanation;Demonstration   Comprehension Verbalized understanding;Returned demonstration          PT Short Term Goals - 02/02/15 1018    PT SHORT TERM GOAL #1   Title independent with HEP   Time 2   Period Weeks   Status Achieved           PT Long Term Goals - 02/02/15 1057    PT LONG TERM GOAL #1   Title independent with RICE   Time 8   Period Weeks   Status Achieved    PT LONG TERM GOAL #2   Title increase AROM of the left knee to 5-115 degrees flexion   Time 8   Period Weeks   Status On-going   PT LONG TERM GOAL #3   Title decrease pain 50%   Time 8   Period Weeks   Status Achieved   PT LONG TERM GOAL #4   Title walk all distances without assistive device, stairs reciprocally   Time 8   Period Weeks   Status Achieved               Plan - 02/07/15 1502    Clinical Impression Statement Genu valgus of RLE that crosses midline causing LLE to circumduct for advancement.  Decreased strength in BLE's.  Increased ROM.   Pt will benefit from skilled therapeutic intervention in order to improve on the following deficits Abnormal gait;Decreased endurance;Decreased range of motion;Difficulty walking;Increased edema;Decreased scar mobility;Pain;Impaired flexibility;Decreased strength   Rehab Potential Good   PT Frequency 3x / week   PT Duration 4 weeks   PT Treatment/Interventions Cryotherapy;Electrical Stimulation;Ultrasound;Gait training;Stair training;Functional mobility training;Balance training;Therapeutic exercise;Therapeutic activities;Patient/family education;Manual techniques;Passive range of motion;Scar mobilization   PT Next Visit Plan Continue to strengthen BLE's for improved safety.   Consulted and Agree with Plan of Care Patient        Problem List Patient Active Problem List   Diagnosis Date Noted  . OA (osteoarthritis) of knee 01/10/2015  . Bursitis, hip 01/08/2014  . Ecchymosis 12/29/2013  . Annual physical exam 11/14/2012  . FECAL INCONTINENCE 03/14/2010  . PERSONAL HX COLONIC POLYPS 03/14/2010  . ARTHRITIS, HANDS, BILATERAL 11/15/2009  . ARTHRITIS, KNEES, BILATERAL 12/07/2008  . HYPOTHYROIDISM 09/28/2008  . 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 OSTEOPENIA 09/28/2008    Jessy Cybulski PTA 02/07/2015, 4:03 PM  [place] [address] [place], [place],  [address] Phone: [phone]   Fax:  [phone]

## 2015-02-09 ENCOUNTER — Ambulatory Visit: Payer: Medicare Other | Admitting: Physical Therapy

## 2015-02-09 ENCOUNTER — Encounter: Payer: Self-pay | Admitting: Physical Therapy

## 2015-02-09 DIAGNOSIS — Z4789 Encounter for other orthopedic aftercare: Secondary | ICD-10-CM | POA: Diagnosis not present

## 2015-02-09 DIAGNOSIS — M25561 Pain in right knee: Secondary | ICD-10-CM

## 2015-02-09 DIAGNOSIS — M25661 Stiffness of right knee, not elsewhere classified: Secondary | ICD-10-CM

## 2015-02-09 DIAGNOSIS — M7989 Other specified soft tissue disorders: Secondary | ICD-10-CM

## 2015-02-09 NOTE — Therapy (Signed)
[place], [place], [address] Phone: [phone]   Fax:  [phone]  Physical Therapy Treatment  Patient Details  Name: Erin Carroll MRN: [id number] Date of Birth: 20-Jul-1946 Referring Provider:  Gaynelle Arabian, MD  Encounter Date: 02/09/2015      PT End of Session - 02/09/15 1029    Visit Number 7   Date for PT Re-Evaluation 03/27/15   PT Start Time 0929   PT Stop Time 1029   PT Time Calculation (min) 60 min      Past Medical History  Diagnosis Date  . Thyroid disease     problems with thyroid  . Alcoholism /alcohol abuse     sober since 1996  . Hypothyroidism   . Arthritis     hands, knees.    Past Surgical History  Procedure Laterality Date  . Right knee arthroscopy  1987/1991  . Shoulder arthroscopy  2004  . Breast biopsy  1977    left  . Tonsillectomy    . Metacarpophalangeal joint arthroplasty Bilateral     bilateral thumbs- Gramig  . Finger surgery Left 12-31-13    left middle finger cyst -aspirated  . Excision/release bursa hip Left 01/08/2014    Procedure: LEFT HIP BURSECTOMY WITH GLUTEAL TENDON REPAIR;  Surgeon: [name], MD;  [facility];  Service: Orthopedics;  Laterality: Left;  With Anchors  . Rectal prolapse repair  MAY 2015  . 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  Visit Diagnosis:  Knee stiffness, right  Right knee pain  Swelling of limb      Subjective Assessment - 02/09/15 0930    Symptoms I have this little twinge that I haven't had before. It was there yesterday.  Maybe I woke up with it.   Currently in Pain? Yes   Pain Score 2    Pain Location Knee   Pain Orientation Left   Pain Type Surgical pain   Pain Onset More than a month ago   Multiple Pain  Sites No          [facility] PT Assessment - 02/09/15 0001    ROM / Strength   AROM / PROM / Strength AROM   AROM   AROM Assessment Site Knee   Right/Left Knee Left   Left Knee Extension 3   Left Knee Flexion 107                  [facility] Adult PT Treatment/Exercise - 02/09/15 0001    Exercises   Exercises Knee/Hip   Knee/Hip Exercises: Aerobic   Stationary Bike 6 minutes   Tread Mill 6 minutes  Nustep level 6   Knee/Hip Exercises: Machines for Strengthening   Cybex Leg Press no weight  for ROM   Knee/Hip Exercises: Seated   Stool Scoot - Round Trips 2   Modalities   Modalities Cryotherapy;Electrical Stimulation   Cryotherapy   Number Minutes Cryotherapy 15 Minutes   Cryotherapy Location Knee   Type of Cryotherapy Other (comment)  vaso   Electrical Stimulation   Electrical Stimulation Location left knee   Electrical Stimulation Parameters IFC   Electrical Stimulation Goals Pain   Manual Therapy   Manual Therapy Passive ROM  PT Short Term Goals - 02/02/15 1018    PT SHORT TERM GOAL #1   Title independent with HEP   Time 2   Period Weeks   Status Achieved           PT Long Term Goals - 02/02/15 1057    PT LONG TERM GOAL #1   Title independent with RICE   Time 8   Period Weeks   Status Achieved   PT LONG TERM GOAL #2   Title increase AROM of the left knee to 5-115 degrees flexion   Time 8   Period Weeks   Status On-going   PT LONG TERM GOAL #3   Title decrease pain 50%   Time 8   Period Weeks   Status Achieved   PT LONG TERM GOAL #4   Title walk all distances without assistive device, stairs reciprocally   Time 8   Period Weeks   Status Achieved               Plan - 02/09/15 1039    Clinical Impression Statement Improved ROM after manual therapy.  Gait is improving with HEP.   Pt will benefit from skilled therapeutic intervention in order to improve on the following deficits Abnormal gait;Decreased  endurance;Decreased range of motion;Difficulty walking;Increased edema;Decreased scar mobility;Pain;Impaired flexibility;Decreased strength   Rehab Potential Good   PT Frequency 3x / week   PT Duration 4 weeks   PT Treatment/Interventions Cryotherapy;Electrical Stimulation;Ultrasound;Gait training;Stair training;Functional mobility training;Balance training;Therapeutic exercise;Therapeutic activities;Patient/family education;Manual techniques;Passive range of motion;Scar mobilization   PT Next Visit Plan Continue to strengthen BLE's for improved safety.   Consulted and Agree with Plan of Care Patient        Problem List Patient Active Problem List   Diagnosis Date Noted  . OA (osteoarthritis) of knee 01/10/2015  . Bursitis, hip 01/08/2014  . Ecchymosis 12/29/2013  . Annual physical exam 11/14/2012  . FECAL INCONTINENCE 03/14/2010  . PERSONAL HX COLONIC POLYPS 03/14/2010  . ARTHRITIS, HANDS, BILATERAL 11/15/2009  . ARTHRITIS, KNEES, BILATERAL 12/07/2008  . HYPOTHYROIDISM 09/28/2008  . 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 OSTEOPENIA 09/28/2008    Calley Drenning PTA 02/09/2015, 10:40 AM  [place] [place], [place], [address] Phone: [phone]   Fax:  [phone]

## 2015-02-11 ENCOUNTER — Ambulatory Visit: Payer: Medicare Other | Admitting: Physical Therapy

## 2015-02-11 ENCOUNTER — Encounter: Payer: Self-pay | Admitting: Physical Therapy

## 2015-02-11 DIAGNOSIS — Z4789 Encounter for other orthopedic aftercare: Secondary | ICD-10-CM | POA: Diagnosis not present

## 2015-02-11 DIAGNOSIS — M25661 Stiffness of right knee, not elsewhere classified: Secondary | ICD-10-CM

## 2015-02-11 DIAGNOSIS — M7989 Other specified soft tissue disorders: Secondary | ICD-10-CM

## 2015-02-11 DIAGNOSIS — M25561 Pain in right knee: Secondary | ICD-10-CM

## 2015-02-11 NOTE — Therapy (Signed)
[place], [place], [address] Phone: [phone]   Fax:  [phone]  Physical Therapy Treatment  Patient Details  Name: Erin Carroll MRN: [id number] Date of Birth: February 18, 1946 Referring Provider:  Gaynelle Arabian, MD  Encounter Date: 02/11/2015      PT End of Session - 02/11/15 1145    Visit Number 8   Date for PT Re-Evaluation 03/27/15   PT Start Time 6222   PT Stop Time 1118   PT Time Calculation (min) 63 min   Activity Tolerance Patient tolerated treatment well   Behavior During Therapy [facility] for tasks assessed/performed      Past Medical History  Diagnosis Date  . Thyroid disease     problems with thyroid  . Alcoholism /alcohol abuse     sober since 1996  . Hypothyroidism   . Arthritis     hands, knees.    Past Surgical History  Procedure Laterality Date  . Right knee arthroscopy  1987/1991  . Shoulder arthroscopy  2004  . Breast biopsy  1977    left  . Tonsillectomy    . Metacarpophalangeal joint arthroplasty Bilateral     bilateral thumbs- Gramig  . Finger surgery Left 12-31-13    left middle finger cyst -aspirated  . Excision/release bursa hip Left 01/08/2014    Procedure: LEFT HIP BURSECTOMY WITH GLUTEAL TENDON REPAIR;  Surgeon: [name], MD;  [facility]: [facility];  Service: Orthopedics;  Laterality: Left;  With Anchors  . Rectal prolapse repair  MAY 2015  . 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  Visit Diagnosis:  Knee stiffness, right  Right knee pain  Swelling of limb      Subjective Assessment - 02/11/15 1015    Symptoms A little achy this morning.   Currently in Pain? Yes   Pain Score 2    Pain Location Knee   Pain Orientation Left   Multiple Pain Sites No                        [facility] Adult PT Treatment/Exercise - 02/11/15 0001    Exercises   Exercises Knee/Hip   Knee/Hip Exercises: Aerobic   Stationary Bike 6 minutes   Elliptical 6 minutes  45fwd/3bk   Tread Mill manual push/pull  10 reps   Knee/Hip Exercises: Machines for Strengthening   Cybex Leg Press 20# 2x10  single leg with switch   Modalities   Modalities Cryotherapy;Electrical Stimulation   Cryotherapy   Number Minutes Cryotherapy 15 Minutes   Cryotherapy Location Knee   Type of Cryotherapy Other (comment)  vaso   Electrical Stimulation   Electrical Stimulation Location left knee   Electrical Stimulation Parameters IFC   Electrical Stimulation Goals Pain   Manual Therapy   Manual Therapy Passive ROM                  PT Short Term Goals - 02/02/15 1018    PT SHORT TERM GOAL #1   Title independent with HEP   Time 2   Period Weeks   Status Achieved           PT Long Term Goals - 02/11/15 1019    PT LONG TERM GOAL #1  Title independent with RICE   Time 8   Period Weeks   Status Achieved   PT LONG TERM GOAL #2   Title increase AROM of the left knee to 5-115 degrees flexion   Period Weeks   Status On-going   PT LONG TERM GOAL #3   Title decrease pain 50%   Time 8   Period Weeks   Status Achieved   PT LONG TERM GOAL #4   Title walk all distances without assistive device, stairs reciprocally   Time 8   Period Weeks   Status Achieved               Problem List Patient Active Problem List   Diagnosis Date Noted  . OA (osteoarthritis) of knee 01/10/2015  . Bursitis, hip 01/08/2014  . Ecchymosis 12/29/2013  . Annual physical exam 11/14/2012  . FECAL INCONTINENCE 03/14/2010  . PERSONAL HX COLONIC POLYPS 03/14/2010  . ARTHRITIS, HANDS, BILATERAL 11/15/2009  . ARTHRITIS, KNEES, BILATERAL 12/07/2008  . HYPOTHYROIDISM 09/28/2008  . 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 OSTEOPENIA 09/28/2008    Olson Lucarelli  PTA 02/11/2015, 11:46 AM  [place] [address] [place], [place], [address] Phone: [phone]   Fax:  [phone]

## 2015-02-14 ENCOUNTER — Ambulatory Visit: Payer: Medicare Other | Admitting: Physical Therapy

## 2015-02-14 ENCOUNTER — Encounter: Payer: Self-pay | Admitting: Physical Therapy

## 2015-02-14 DIAGNOSIS — M25661 Stiffness of right knee, not elsewhere classified: Secondary | ICD-10-CM

## 2015-02-14 DIAGNOSIS — M7989 Other specified soft tissue disorders: Secondary | ICD-10-CM

## 2015-02-14 DIAGNOSIS — M25561 Pain in right knee: Secondary | ICD-10-CM

## 2015-02-14 DIAGNOSIS — Z4789 Encounter for other orthopedic aftercare: Secondary | ICD-10-CM | POA: Diagnosis not present

## 2015-02-14 NOTE — Therapy (Signed)
[place], [place], [address] Phone: [phone]   Fax:  [phone]  Physical Therapy Treatment  Patient Details  Name: Erin Carroll MRN: [id number] Date of Birth: 10-13-1946 Referring Provider:  Gaynelle Arabian, MD  Encounter Date: 02/14/2015      PT End of Session - 02/14/15 1500    Visit Number 9   Date for PT Re-Evaluation 03/27/15   PT Start Time 1400   PT Stop Time 1500   PT Time Calculation (min) 60 min   Activity Tolerance Patient tolerated treatment well   Behavior During Therapy [facility] for tasks assessed/performed      Past Medical History  Diagnosis Date  . Thyroid disease     problems with thyroid  . Alcoholism /alcohol abuse     sober since 1996  . Hypothyroidism   . Arthritis     hands, knees.    Past Surgical History  Procedure Laterality Date  . Right knee arthroscopy  1987/1991  . Shoulder arthroscopy  2004  . Breast biopsy  1977    left  . Tonsillectomy    . Metacarpophalangeal joint arthroplasty Bilateral     bilateral thumbs- Gramig  . Finger surgery Left 12-31-13    left middle finger cyst -aspirated  . Excision/release bursa hip Left 01/08/2014    Procedure: LEFT HIP BURSECTOMY WITH GLUTEAL TENDON REPAIR;  Surgeon: [name], MD;  Location: [facility];  Service: Orthopedics;  Laterality: Left;  With Anchors  . Rectal prolapse repair  MAY 2015  . 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  Visit Diagnosis:  Knee stiffness, right  Right knee pain  Swelling of limb      Subjective Assessment - 02/14/15 1358    Symptoms I got out of bed and felt like a normal person this morning.   Currently in Pain? No/denies   Multiple Pain Sites No            [facility] PT Assessment -  02/14/15 0001    AROM   AROM Assessment Site Knee   Right/Left Knee Left   Left Knee Extension 3   Left Knee Flexion 107                   [facility] Adult PT Treatment/Exercise - 02/14/15 0001    Balance   Balance Assessed Yes   Dynamic Standing Balance   Dynamic Standing - Balance Support No upper extremity supported   Dynamic Standing - Level of Assistance 7: Independent   Dynamic Standing - Balance Activities Ball toss;Eyes open;Eyes closed;Head turns;Other (comment)  BOSU stand; mini squat; foam cone touch; SLS cone dead lift   Exercises   Exercises Knee/Hip   Knee/Hip Exercises: Stretches   Active Hamstring Stretch 60 seconds   Active Hamstring Stretch Limitations tight medial hamstring   Knee/Hip Exercises: Aerobic   Stationary Bike 6 minutes   Elliptical 6 minutes  incline 10;resistance 5; 40fd/3bk   Knee/Hip Exercises: Machines for Strengthening   Cybex Knee Extension 10#  2x15 at EOR   Cybex Knee Flexion 25# with squeeze  2x15   Knee/Hip Exercises: Standing   Other Standing Knee Exercises lunge stretch on mat table   Knee/Hip Exercises: Seated   Stool Scoot - Round  Trips 2   Knee/Hip Exercises: Supine   Other Supine Knee Exercises KTC AAROM  10 reps with 5 second hold                  PT Short Term Goals - 02/02/15 1018    PT SHORT TERM GOAL #1   Title independent with HEP   Time 2   Period Weeks   Status Achieved           PT Long Term Goals - 2015-02-16 1522    PT LONG TERM GOAL #1   Title independent with RICE   Time 8   Status Achieved   PT LONG TERM GOAL #2   Title increase AROM of the left knee to 5-115 degrees flexion   Time 8   Period Weeks   Status On-going   PT LONG TERM GOAL #3   Title decrease pain 50%   Time 8   Period Weeks   Status Achieved   PT LONG TERM GOAL #4   Title walk all distances without assistive device, stairs reciprocally   Time 8   Period Weeks   Status Achieved               Plan -  02/16/15 1519    Clinical Impression Statement Balance is very good.  Strength is improving.  Tight medial hamstring.  Patient to see MD tomorrow.  He may discharge.  ROM is only goal not met.   Pt will benefit from skilled therapeutic intervention in order to improve on the following deficits Abnormal gait;Decreased endurance;Decreased range of motion;Increased edema;Impaired flexibility;Decreased strength   PT Frequency 3x / week   PT Duration 4 weeks   PT Treatment/Interventions Electrical Stimulation;Ultrasound;Gait training;Stair training;Therapeutic activities;Functional mobility training;Therapeutic exercise;Patient/family education;Manual techniques;Passive range of motion   PT Next Visit Plan If not discharged by MD, continue to work on ROM, strength, and gait.   Consulted and Agree with Plan of Care Patient          G-Codes - 02-16-2015 1518    Functional Assessment Tool Used FOTO   Functional Limitation Mobility: Walking and moving around   Mobility: Walking and Moving Around Current Status [phone]) At least 40 percent but less than 60 percent impaired, limited or restricted   Mobility: Walking and Moving Around Goal Status [phone]) At least 40 percent but less than 60 percent impaired, limited or restricted      Problem List Patient Active Problem List   Diagnosis Date Noted  . OA (osteoarthritis) of knee 01/10/2015  . Bursitis, hip 01/08/2014  . Ecchymosis 12/29/2013  . Annual physical exam 11/14/2012  . FECAL INCONTINENCE 03/14/2010  . PERSONAL HX COLONIC POLYPS 03/14/2010  . ARTHRITIS, HANDS, BILATERAL 11/15/2009  . ARTHRITIS, KNEES, BILATERAL 12/07/2008  . HYPOTHYROIDISM 09/28/2008  . 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 OSTEOPENIA 09/28/2008    Jahlon Baines PTA Feb 16, 2015, 3:25 PM  [address] [place], [place], [address] Phone: [phone]   Fax:  [phone]

## 2015-02-16 ENCOUNTER — Encounter: Payer: Self-pay | Admitting: Physical Therapy

## 2015-02-16 ENCOUNTER — Ambulatory Visit: Payer: Medicare Other | Admitting: Physical Therapy

## 2015-02-16 DIAGNOSIS — M25661 Stiffness of right knee, not elsewhere classified: Secondary | ICD-10-CM

## 2015-02-16 DIAGNOSIS — M7989 Other specified soft tissue disorders: Secondary | ICD-10-CM

## 2015-02-16 DIAGNOSIS — Z4789 Encounter for other orthopedic aftercare: Secondary | ICD-10-CM | POA: Diagnosis not present

## 2015-02-16 DIAGNOSIS — M25561 Pain in right knee: Secondary | ICD-10-CM

## 2015-02-16 NOTE — Therapy (Signed)
[place] [address] [place], [place], [address] Phone: [phone]   Fax:  [phone]  Physical Therapy Treatment  Patient Details  Name: Erin Carroll MRN: [id number] Date of Birth: [birth year] Referring Provider:  Gaynelle Arabian, MD  Encounter Date: 02/16/2015      PT End of Session - 02/16/15 1012    Visit Number 10   Date for PT Re-Evaluation 03/27/15   PT Start Time 0930   PT Stop Time 1012   PT Time Calculation (min) 42 min   Activity Tolerance Patient tolerated treatment well   Behavior During Therapy [facility] for tasks assessed/performed      Past Medical History  Diagnosis Date  . Thyroid disease     problems with thyroid  . Alcoholism /alcohol abuse     sober since 1996  . Hypothyroidism   . Arthritis     hands, knees.    Past Surgical History  Procedure Laterality Date  . Right knee arthroscopy  1987/1991  . Shoulder arthroscopy  2004  . Breast biopsy  1977    left  . Tonsillectomy    . Metacarpophalangeal joint arthroplasty Bilateral     bilateral thumbs- Gramig  . Finger surgery Left 12-31-13    left middle finger cyst -aspirated  . Excision/release bursa hip Left 01/08/2014    Procedure: LEFT HIP BURSECTOMY WITH GLUTEAL TENDON REPAIR;  Surgeon: [name], MD;  Location: [facility];  Service: Orthopedics;  Laterality: Left;  With Anchors  . Rectal prolapse repair  MAY 2015  . 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  Visit Diagnosis:  No diagnosis found.      Subjective Assessment - 02/16/15 0932    Symptoms Pain is insignificant.   Currently in Pain? No/denies   Multiple Pain Sites No            [facility] PT Assessment - 02/16/15 0001    ROM / Strength   AROM / PROM / Strength AROM   AROM   AROM  Assessment Site Knee   Right/Left Knee Left   Left Knee Extension 3   Left Knee Flexion 110                   [facility] Adult PT Treatment/Exercise - 02/16/15 0001    Exercises   Exercises Knee/Hip   Knee/Hip Exercises: Aerobic   Stationary Bike 6 minutes   Elliptical 6 minutes  incline 10; resistance 5   Knee/Hip Exercises: Machines for Strengthening   Cybex Knee Extension 5#  2x10 at EOR single leg   Cybex Knee Flexion 25# ;10# single leg  2x15 at EOR; 2x10   Cybex Leg Press 30#   2x15                PT Education - 02/16/15 1013    Education provided Yes   Education Details Discussed exercise progression and safety.   Person(s) Educated Patient   Methods Explanation;Demonstration   Comprehension Verbalized understanding;Returned demonstration          PT Short Term Goals - 02/02/15 1018    PT SHORT TERM GOAL #1   Title independent with HEP   Time 2   Period Weeks   Status Achieved  PT Long Term Goals - 02/14/15 1522    PT LONG TERM GOAL #1   Title independent with RICE   Time 8   Status Achieved   PT LONG TERM GOAL #2   Title increase AROM of the left knee to 5-115 degrees flexion   Time 8   Period Weeks   Status On-going   PT LONG TERM GOAL #3   Title decrease pain 50%   Time 8   Period Weeks   Status Achieved   PT LONG TERM GOAL #4   Title walk all distances without assistive device, stairs reciprocally   Time 8   Period Weeks   Status Achieved               Plan - 02/16/15 1005    Clinical Impression Statement Patient has really improved.  Gait continues to have some lack of toe off.   Pt will benefit from skilled therapeutic intervention in order to improve on the following deficits Abnormal gait;Decreased strength   Rehab Potential Good   PT Frequency 3x / week   PT Duration 4 weeks   PT Next Visit Plan Continue to work toward gym independence.  Discharge next visit.   Consulted and Agree with Plan of  Care Patient        Problem List Patient Active Problem List   Diagnosis Date Noted  . OA (osteoarthritis) of knee 01/10/2015  . Bursitis, hip 01/08/2014  . Ecchymosis 12/29/2013  . Annual physical exam 11/14/2012  . FECAL INCONTINENCE 03/14/2010  . PERSONAL HX COLONIC POLYPS 03/14/2010  . ARTHRITIS, HANDS, BILATERAL 11/15/2009  . ARTHRITIS, KNEES, BILATERAL 12/07/2008  . HYPOTHYROIDISM 09/28/2008  . 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 OSTEOPENIA 09/28/2008    Wandra Babin PTA 02/16/2015, 10:14 AM  [address] [place], [place], [address] Phone: [phone]   Fax:  [phone]

## 2015-02-18 ENCOUNTER — Ambulatory Visit: Payer: Medicare Other | Admitting: Physical Therapy

## 2015-02-18 ENCOUNTER — Encounter: Payer: Self-pay | Admitting: Physical Therapy

## 2015-02-18 DIAGNOSIS — Z4789 Encounter for other orthopedic aftercare: Secondary | ICD-10-CM | POA: Diagnosis not present

## 2015-02-18 DIAGNOSIS — M25661 Stiffness of right knee, not elsewhere classified: Secondary | ICD-10-CM

## 2015-02-18 DIAGNOSIS — M25561 Pain in right knee: Secondary | ICD-10-CM

## 2015-02-18 DIAGNOSIS — M7989 Other specified soft tissue disorders: Secondary | ICD-10-CM

## 2015-02-18 NOTE — Therapy (Signed)
[place], [place], [address] Phone: [phone]   Fax:  [phone]  Physical Therapy Treatment  Patient Details  Name: Erin Carroll MRN: [id number] Date of Birth: 1946/05/16 Referring Provider:  Gaynelle Arabian, MD  Encounter Date: 02/18/2015      PT End of Session - 02/18/15 0954    Visit Number 11   Date for PT Re-Evaluation 03/27/15   PT Start Time 0840   PT Stop Time 0954   PT Time Calculation (min) 74 min   Activity Tolerance Patient tolerated treatment well   Behavior During Therapy [facility] for tasks assessed/performed      Past Medical History  Diagnosis Date  . Thyroid disease     problems with thyroid  . Alcoholism /alcohol abuse     sober since 1996  . Hypothyroidism   . Arthritis     hands, knees.    Past Surgical History  Procedure Laterality Date  . Right knee arthroscopy  1987/1991  . Shoulder arthroscopy  2004  . Breast biopsy  1977    left  . Tonsillectomy    . Metacarpophalangeal joint arthroplasty Bilateral     bilateral thumbs- Gramig  . Finger surgery Left 12-31-13    left middle finger cyst -aspirated  . Excision/release bursa hip Left 01/08/2014    Procedure: LEFT HIP BURSECTOMY WITH GLUTEAL TENDON REPAIR;  Surgeon: [name], MD;  Location: [facility];  Service: Orthopedics;  Laterality: Left;  With Anchors  . Rectal prolapse repair  MAY 2015  . 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  Visit Diagnosis:  Knee stiffness, right  Right knee pain  Swelling of limb      Subjective Assessment - 02/18/15 0843    Symptoms Yesterday was a bad day.   Currently in Pain? Yes   Pain Score 1    Multiple Pain Sites No                       [facility] Adult PT  Treatment/Exercise - 02/18/15 0001    Exercises   Exercises Knee/Hip   Knee/Hip Exercises: Aerobic   Stationary Bike 6 minutes   Elliptical 6 minutes  47fd/3bk   Knee/Hip Exercises: Machines for Strengthening   Cybex Knee Extension 10# 2x15   Cybex Knee Flexion 25# 2x15   Knee/Hip Exercises: Standing   Other Standing Knee Exercises 6 inch step up and over   Other Standing Knee Exercises 5# mat squat 2x10                PT Education - 02/18/15 1004    Education provided Yes   Education Details Advanced HEP and independence with gym safety.   Person(s) Educated Patient   Methods Explanation;Demonstration   Comprehension Verbalized understanding;Returned demonstration          PT Short Term Goals - 02/02/15 1018    PT SHORT TERM GOAL #1   Title independent with HEP   Time 2   Period Weeks   Status Achieved           PT Long Term Goals - 02/18/15 00677   PT LONG TERM GOAL #1   Title independent with RICE   Time 8  Period Weeks   Status Achieved   PT LONG TERM GOAL #2   Title increase AROM of the left knee to 5-115 degrees flexion   Time 8   Period Weeks   Status Not Met   PT LONG TERM GOAL #3   Title decrease pain 50%   Time 8   Period Weeks   Status Achieved   PT LONG TERM GOAL #4   Title walk all distances without assistive device, stairs reciprocally   Time 8   Period Weeks   Status Achieved               Problem List Patient Active Problem List   Diagnosis Date Noted  . OA (osteoarthritis) of knee 01/10/2015  . Bursitis, hip 01/08/2014  . Ecchymosis 12/29/2013  . Annual physical exam 11/14/2012  . FECAL INCONTINENCE 03/14/2010  . PERSONAL HX COLONIC POLYPS 03/14/2010  . ARTHRITIS, HANDS, BILATERAL 11/15/2009  . ARTHRITIS, KNEES, BILATERAL 12/07/2008  . HYPOTHYROIDISM 09/28/2008  . 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 OSTEOPENIA 09/28/2008    Akemi Overholser PTA 02/18/2015, 10:05 AM  [address] [place], [place], [address] Phone: [phone]   Fax:  [phone]

## 2015-03-09 ENCOUNTER — Encounter: Payer: Self-pay | Admitting: Internal Medicine

## 2015-04-21 ENCOUNTER — Encounter: Payer: Self-pay | Admitting: Internal Medicine

## 2015-05-03 DIAGNOSIS — I444 Left anterior fascicular block: Secondary | ICD-10-CM | POA: Insufficient documentation

## 2015-05-03 DIAGNOSIS — R9431 Abnormal electrocardiogram [ECG] [EKG]: Secondary | ICD-10-CM | POA: Insufficient documentation

## 2015-05-30 ENCOUNTER — Other Ambulatory Visit: Payer: Self-pay

## 2015-06-08 ENCOUNTER — Telehealth: Payer: Self-pay | Admitting: *Deleted

## 2015-06-08 NOTE — Telephone Encounter (Signed)
LMOM to call back to see if pt is still on Xarelto

## 2015-06-09 NOTE — Telephone Encounter (Signed)
Pt returned your call and said she discontinued taking Xarelto in March 2016

## 2015-06-15 ENCOUNTER — Ambulatory Visit (AMBULATORY_SURGERY_CENTER): Payer: Self-pay | Admitting: *Deleted

## 2015-06-15 VITALS — Ht 62.0 in | Wt 112.0 lb

## 2015-06-15 DIAGNOSIS — Z86010 Personal history of colonic polyps: Secondary | ICD-10-CM

## 2015-06-15 MED ORDER — NA SULFATE-K SULFATE-MG SULF 17.5-3.13-1.6 GM/177ML PO SOLN: ORAL | Status: DC

## 2015-06-15 NOTE — Progress Notes (Signed)
Patient denies any allergies to eggs or soy. Patient denies any problems with anesthesia/sedation. Patient denies any oxygen use at home and does not take any diet/weight loss medications. Patient declined emmi information at this time.

## 2015-06-29 ENCOUNTER — Encounter: Payer: Self-pay | Admitting: Internal Medicine

## 2015-06-29 ENCOUNTER — Ambulatory Visit (AMBULATORY_SURGERY_CENTER): Payer: Medicare Other | Admitting: Internal Medicine

## 2015-06-29 VITALS — BP 94/60 | HR 54 | Temp 96.9°F | Resp 17 | Ht 62.0 in | Wt 112.0 lb

## 2015-06-29 DIAGNOSIS — K635 Polyp of colon: Secondary | ICD-10-CM

## 2015-06-29 DIAGNOSIS — Z86010 Personal history of colonic polyps: Secondary | ICD-10-CM

## 2015-06-29 DIAGNOSIS — D122 Benign neoplasm of ascending colon: Secondary | ICD-10-CM

## 2015-06-29 MED ORDER — SODIUM CHLORIDE 0.9 % IV SOLN: 500.0000 mL | INTRAVENOUS | Status: DC

## 2015-06-29 NOTE — Patient Instructions (Signed)
YOU HAD AN ENDOSCOPIC PROCEDURE TODAY AT THE [facility]:   Refer to the procedure report that was given to you for any specific questions about what was found during the examination.  If the procedure report does not answer your questions, please call your gastroenterologist to clarify.  If you requested that your care partner not be given the details of your procedure findings, then the procedure report has been included in a sealed envelope for you to review at your convenience later.  YOU SHOULD EXPECT: Some feelings of bloating in the abdomen. Passage of more gas than usual.  Walking can help get rid of the air that was put into your GI tract during the procedure and reduce the bloating. If you had a lower endoscopy (such as a colonoscopy or flexible sigmoidoscopy) you may notice spotting of blood in your stool or on the toilet paper. If you underwent a bowel prep for your procedure, you may not have a normal bowel movement for a few days.  Please Note:  You might notice some irritation and congestion in your nose or some drainage.  This is from the oxygen used during your procedure.  There is no need for concern and it should clear up in a day or so.  SYMPTOMS TO REPORT IMMEDIATELY:   Following lower endoscopy (colonoscopy or flexible sigmoidoscopy):  Excessive amounts of blood in the stool  Significant tenderness or worsening of abdominal pains  Swelling of the abdomen that is new, acute  Fever of 100F or higher   For urgent or emergent issues, a gastroenterologist can be reached at any hour by calling [phone].   DIET: Your first meal following the procedure should be a small meal and then it is ok to progress to your normal diet. Heavy or fried foods are harder to digest and may make you feel nauseous or bloated.  Likewise, meals heavy in dairy and vegetables can increase bloating.  Drink plenty of fluids but you should avoid alcoholic beverages for 24  hours.  ACTIVITY:  You should plan to take it easy for the rest of today and you should NOT DRIVE or use heavy machinery until tomorrow (because of the sedation medicines used during the test).    FOLLOW UP: Our staff will call the number listed on your records the next business day following your procedure to check on you and address any questions or concerns that you may have regarding the information given to you following your procedure. If we do not reach you, we will leave a message.  However, if you are feeling well and you are not experiencing any problems, there is no need to return our call.  We will assume that you have returned to your regular daily activities without incident.  If any biopsies were taken you will be contacted by phone or by letter within the next 1-3 weeks.  Please call us at [phone] if you have not heard about the biopsies in 3 weeks.    SIGNATURES/CONFIDENTIALITY: You and/or your care partner have signed paperwork which will be entered into your electronic medical record.  These signatures attest to the fact that that the information above on your After Visit Summary has been reviewed and is understood.  Full responsibility of the confidentiality of this discharge information lies with you and/or your care-partner.  Polyp-handout given  Repeat colonoscopy will be determined by pathology.   

## 2015-06-29 NOTE — Op Note (Signed)
[place]  [address] [place], [address]   COLONOSCOPY PROCEDURE REPORT  PATIENT: Erin Carroll, Erin Carroll  MR#: [id number] BIRTHDATE: Oct 16, 1946 , 69  yrs. old GENDER: female ENDOSCOPIST: Eustace Quail, MD REFERRED [id number] Program Recall PROCEDURE DATE:  06/29/2015 PROCEDURE:   Colonoscopy, surveillance and Colonoscopy with snare polypectomy X1 First Screening Colonoscopy - Avg.  risk and is 50 yrs.  old or older - No.  Prior Negative Screening - Now for repeat screening. N/A  History of Adenoma - Now for follow-up colonoscopy & has been > or = to 3 yrs.  Yes hx of adenoma.  Has been 3 or more years since last colonoscopy.  Polyps removed today? Yes ASA CLASS:   Class II INDICATIONS:Surveillance due to prior colonic neoplasia and PH Colon Adenoma.. Index examination 2003 with diminutive adenoma. Follow-up examinations 2003 and 2006 without polyps. MEDICATIONS: Monitored anesthesia care and Propofol 200 mg IV  DESCRIPTION OF PROCEDURE:   After the risks benefits and alternatives of the procedure were thoroughly explained, informed consent was obtained.  The digital rectal exam revealed no abnormalities of the rectum.   The LB [id number]  endoscope was introduced through the anus and advanced to the cecum, which was identified by both the appendix and ileocecal valve. No adverse events experienced.   The quality of the prep was excellent. (Suprep was used)  The instrument was then slowly withdrawn as the colon was fully examined. Estimated blood loss is zero unless otherwise noted in this procedure report.    COLON FINDINGS: A single polyp measuring 3 mm in size was found in the ascending colon.  A polypectomy was performed with a cold snare.  The resection was complete, the polyp tissue was completely retrieved and sent to histology.   The examination was otherwise normal.  Retroflexed views revealed no abnormalities. The time to cecum = 3.0  Withdrawal time = 13.2   The scope was withdrawn and the procedure completed. COMPLICATIONS: There were no immediate complications.  ENDOSCOPIC IMPRESSION: 1.   Single polyp was found in the ascending colon; polypectomy was performed with a cold snare 2.   The examination was otherwise normal  RECOMMENDATIONS: 1. Repeat colonoscopy in 5 years if polyp adenomatous; otherwise 10 years  eSigned:  Eustace Quail, MD 06/29/2015 10:07 AM   cc: The Patient    ; Vedia Coffer, [facility]

## 2015-06-29 NOTE — Progress Notes (Signed)
Called to room to assist during endoscopic procedure.  Patient ID and intended procedure confirmed with present staff. Received instructions for my participation in the procedure from the performing physician.  

## 2015-06-29 NOTE — Progress Notes (Signed)
Report to PACU, RN, vss, BBS= Clear.  

## 2015-06-30 ENCOUNTER — Telehealth: Payer: Self-pay | Admitting: *Deleted

## 2015-06-30 NOTE — Telephone Encounter (Signed)
  Follow up Call-  Call back number 06/29/2015  Post procedure Call Back phone  # [phone]  Permission to leave phone message Yes     Patient questions:  Do you have a fever, pain , or abdominal swelling? No. Pain Score  0 *  Have you tolerated food without any problems? Yes.    Have you been able to return to your normal activities? Yes.    Do you have any questions about your discharge instructions: Diet   No. Medications  No. Follow up visit  No.  Do you have questions or concerns about your Care? No.  Actions: * If pain score is 4 or above: No action needed, pain <4.

## 2015-07-04 ENCOUNTER — Encounter: Payer: Self-pay | Admitting: Internal Medicine

## 2015-11-30 ENCOUNTER — Encounter: Payer: Self-pay | Admitting: *Deleted

## 2016-04-04 ENCOUNTER — Telehealth: Payer: Self-pay | Admitting: Internal Medicine

## 2016-04-04 NOTE — Telephone Encounter (Signed)
Pt had surgery at the end of 2014 for rectal prolapse at [facility]. Pt states she is having some issues in that area with pain, fecal incontinence, and itching. Discussed with pt that she should call [facility] and get their input as to if they want to see her or if she should see her GI doc. Pt verbalized understanding.

## 2016-10-02 ENCOUNTER — Encounter: Payer: Self-pay | Admitting: Physical Therapy

## 2016-10-02 ENCOUNTER — Ambulatory Visit: Payer: Medicare Other | Attending: Physician Assistant | Admitting: Physical Therapy

## 2016-10-02 DIAGNOSIS — R262 Difficulty in walking, not elsewhere classified: Secondary | ICD-10-CM | POA: Insufficient documentation

## 2016-10-02 DIAGNOSIS — M25551 Pain in right hip: Secondary | ICD-10-CM | POA: Diagnosis present

## 2016-10-02 NOTE — Therapy (Signed)
[place], [place], [address] Phone: [phone]   Fax:  [phone]  Physical Therapy Evaluation  Patient Details  Name: Erin Carroll MRN: [id number] Date of Birth: 01-30-1946 Referring Provider: Andree Coss  Encounter Date: 10/02/2016      PT End of Session - 10/02/16 1510    Visit Number 1   Date for PT Re-Evaluation 12/02/16   PT Start Time 1438   PT Stop Time 1514   PT Time Calculation (min) 36 min   Activity Tolerance Patient tolerated treatment well   Behavior During Therapy [facility] for tasks assessed/performed      Past Medical History:  Diagnosis Date  . Alcoholism /alcohol abuse ([place])    sober since 51  . Arthritis    hands, knees.  . Hypothyroidism   . Thyroid disease    problems with thyroid    Past Surgical History:  Procedure Laterality Date  . BREAST BIOPSY  1977   left  . EXCISION/RELEASE BURSA HIP Left 01/08/2014   Procedure: LEFT HIP BURSECTOMY WITH GLUTEAL TENDON REPAIR;  Surgeon: [name], MD;  [facility]: [facility];  Service: Orthopedics;  Laterality: Left;  With Anchors  . FINGER SURGERY Left 12-31-13   left middle finger cyst -aspirated  . METACARPOPHALANGEAL JOINT ARTHROPLASTY Bilateral    bilateral thumbs- Gramig  . RECTAL PROLAPSE REPAIR  MAY 2015  . right knee arthroscopy  1987/1991  . SHOULDER ARTHROSCOPY  2004  . TONSILLECTOMY    . TOTAL KNEE ARTHROPLASTY Left 01/10/2015   Procedure: LEFT TOTAL KNEE ARTHROPLASTY;  Surgeon: [name], MD;  [facility];  Service: Orthopedics;  Laterality: Left;    There were no vitals filed for this visit.       Subjective Assessment - 10/02/16 1444    Subjective Patient reports that she started having right hip pain in August.  She is unsure of a specific cause but reports that she was having synvysc injections in the right knee and reports that she had a reaction to the 2nd injeciton and she reports that  she was limping and about 2 weeks later she started having the right hip pain.     Limitations Writing   Patient Stated Goals have less pain   Currently in Pain? Yes   Pain Score 2    Pain Location Hip   Pain Orientation Right;Lateral   Pain Descriptors / Indicators Burning;Aching   Pain Type Acute pain   Pain Onset More than a month ago   Pain Frequency Constant   Aggravating Factors  sitting and driving increase the pain, crossing the right leg over the left  will increase pain to 6/10   Pain Relieving Factors ice helps, rest helps   Effect of Pain on Daily Activities limits sitting and driving            [facility] PT Assessment - 10/02/16 0001      Assessment   Medical Diagnosis right hip bursitis   Referring Provider S. Chabon   Onset Date/Surgical Date 09/01/16   Prior Therapy none     Precautions   Precautions None     Balance Screen   Has the patient fallen in the past 6 months No   Has the patient had a decrease in activity level because of a fear of falling?  No   Is the patient reluctant to leave their home because of a fear of falling?  No  Home Environment   Additional Comments has stairs at home, does housework, does yardwork     Prior Function   Level of Independence Independent   Vocation Part time employment   Vocation Requirements mostly standing   Leisure exercises 2x/week     ROM / Strength   AROM / PROM / Strength AROM;Strength     AROM   Overall AROM Comments WFL's for the hip, decreased 50% for the lumbar motions     Strength   Overall Strength Comments right hip 4-/5      Flexibility   Soft Tissue Assessment /Muscle Length --  very tight ITB and piriformis mms     Palpation   Palpation comment she is very tight and sore along the IT, very tender over the right GT area     Ambulation/Gait   Gait Comments a very slight antalgic gait on the right                   [facility] Adult PT Treatment/Exercise - 10/02/16 0001       Modalities   Modalities Iontophoresis     Iontophoresis   Type of Iontophoresis Dexamethasone   Location right greater trochanter   Dose 75mA   Time 4 hour patch                PT Education - 10/02/16 1510    Education provided Yes   Education Details HEP for piriformis and ITB stretch   Person(s) Educated Patient   Methods Explanation;Demonstration;Handout   Comprehension Verbalized understanding;Returned demonstration;Verbal cues required          PT Short Term Goals - 10/02/16 1514      PT SHORT TERM GOAL #1   Title independent with HEP   Time 2   Period Weeks   Status New           PT Long Term Goals - 10/02/16 1514      PT LONG TERM GOAL #1   Title independent with RICE   Time 8   Period Weeks   Status New     PT LONG TERM GOAL #2   Title increase strength of the right hip to 4+/6   Time 8   Period Weeks   Status New     PT LONG TERM GOAL #3   Title decrease pain 50%   Time 8   Period Weeks   Status New     PT LONG TERM GOAL #4   Title no difficulty with sitting or walking   Time 8   Period Weeks   Status New               Plan - 10/02/16 1511    Clinical Impression Statement Patient with dx of right GT bursitis, she is weak on the right hip, she is tight in the ITB and in the piriformis mms. Very tender ovet the GT area, she is also tender over the distal ITB   Rehab Potential Good   PT Frequency 2x / week   PT Duration 8 weeks   PT Treatment/Interventions Cryotherapy;Electrical Stimulation;Iontophoresis 4mg /ml Dexamethasone;ADLs/Self Care Home Management;Ultrasound;Stair training;Functional mobility training;Therapeutic activities;Therapeutic exercise;Balance training;Neuromuscular re-education;Patient/family education;Manual techniques;Taping   PT Next Visit Plan Slowly work on strength, could do some ITB stripping   Consulted and Agree with Plan of Care Patient      Patient will benefit from skilled therapeutic  intervention in order to improve the following deficits and impairments:  Abnormal gait, Decreased  range of motion, Decreased strength, Difficulty walking, Impaired flexibility, Pain  Visit Diagnosis: Pain in right hip - Plan: PT plan of care cert/re-cert  Difficulty in walking, not elsewhere classified - Plan: PT plan of care cert/re-cert      G-Codes - [id number]    Functional Assessment Tool Used foto 56% limitation   Functional Limitation Mobility: Walking and moving around   Mobility: Walking and Moving Around Current Status [phone]) At least 40 percent but less than 60 percent impaired, limited or restricted   Mobility: Walking and Moving Around Goal Status [phone]) At least 40 percent but less than 60 percent impaired, limited or restricted       Problem List Patient Active Problem List   Diagnosis Date Noted  . Abnormal ECG 05/03/2015  . 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 OA (osteoarthritis) of knee 01/10/2015  . Arthritis of knee, degenerative 01/10/2015  . Bursitis, hip 01/08/2014  . Ecchymosis 12/29/2013  . Annual physical exam 11/14/2012  . FECAL INCONTINENCE 03/14/2010  . PERSONAL HX COLONIC POLYPS 03/14/2010  . ARTHRITIS, HANDS, BILATERAL 11/15/2009  . ARTHRITIS, KNEES, BILATERAL 12/07/2008  . HYPOTHYROIDISM 09/28/2008  . 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 OSTEOPENIA 09/28/2008    Sumner Boast., PT 10/02/2016, 3:23 PM  [place], [place], [address] Phone: [phone]   Fax:  [phone]  Name: Erin Carroll MRN: [id number] Date of Birth: 19-Sep-1946

## 2016-10-08 ENCOUNTER — Ambulatory Visit: Payer: Medicare Other | Attending: Orthopedic Surgery | Admitting: Physical Therapy

## 2016-10-08 ENCOUNTER — Encounter: Payer: Self-pay | Admitting: Physical Therapy

## 2016-10-08 DIAGNOSIS — M7989 Other specified soft tissue disorders: Secondary | ICD-10-CM | POA: Insufficient documentation

## 2016-10-08 DIAGNOSIS — R262 Difficulty in walking, not elsewhere classified: Secondary | ICD-10-CM | POA: Insufficient documentation

## 2016-10-08 DIAGNOSIS — M25551 Pain in right hip: Secondary | ICD-10-CM | POA: Insufficient documentation

## 2016-10-08 DIAGNOSIS — M25561 Pain in right knee: Secondary | ICD-10-CM | POA: Diagnosis present

## 2016-10-08 DIAGNOSIS — M25661 Stiffness of right knee, not elsewhere classified: Secondary | ICD-10-CM | POA: Diagnosis present

## 2016-10-08 DIAGNOSIS — G8929 Other chronic pain: Secondary | ICD-10-CM | POA: Diagnosis present

## 2016-10-08 NOTE — Therapy (Signed)
[place], [place], [address] Phone: [phone]   Fax:  [phone]  Physical Therapy Treatment  Patient Details  Name: Erin Carroll MRN: [id number] Date of Birth: [birth year] Referring Provider: Andree Coss  Encounter Date: 10/08/2016      PT End of Session - 10/08/16 1508    Visit Number 2   Date for PT Re-Evaluation 12/02/16   PT Start Time 1428   PT Stop Time 1509   PT Time Calculation (min) 41 min   Activity Tolerance Patient tolerated treatment well   Behavior During Therapy [facility] for tasks assessed/performed      Past Medical History:  Diagnosis Date  . Alcoholism /alcohol abuse ([place])    sober since 89  . Arthritis    hands, knees.  . Hypothyroidism   . Thyroid disease    problems with thyroid    Past Surgical History:  Procedure Laterality Date  . BREAST BIOPSY  1977   left  . EXCISION/RELEASE BURSA HIP Left 01/08/2014   Procedure: LEFT HIP BURSECTOMY WITH GLUTEAL TENDON REPAIR;  Surgeon: [name], MD;  [facility];  Service: Orthopedics;  Laterality: Left;  With Anchors  . FINGER SURGERY Left 12-31-13   left middle finger cyst -aspirated  . METACARPOPHALANGEAL JOINT ARTHROPLASTY Bilateral    bilateral thumbs- Gramig  . RECTAL PROLAPSE REPAIR  MAY 2015  . right knee arthroscopy  1987/1991  . SHOULDER ARTHROSCOPY  2004  . TONSILLECTOMY    . TOTAL KNEE ARTHROPLASTY Left 01/10/2015   Procedure: LEFT TOTAL KNEE ARTHROPLASTY;  Surgeon: [name], MD;  [facility];  Service: Orthopedics;  Laterality: Left;    There were no vitals filed for this visit.      Subjective Assessment - 10/08/16 1427    Subjective Pt reports no changes since evaluation   Currently in Pain? Yes   Pain Score 2    Pain Location Hip   Pain Orientation Right                         [facility] Adult PT Treatment/Exercise - 10/08/16 0001      Exercises   Exercises Knee/Hip     Knee/Hip Exercises: Stretches   Passive Hamstring Stretch 5 reps;10 seconds   ITB Stretch Right;5 reps;10 seconds   Piriformis Stretch 3 reps;10 seconds     Knee/Hip Exercises: Aerobic   Nustep NuStep L6 x6 min      Knee/Hip Exercises: Machines for Strengthening   Cybex Knee Extension 5lb 2x15   Cybex Knee Flexion 20lb 2x15   Cybex Leg Press 20lb 2x10      Knee/Hip Exercises: Seated   Sit to Sand 2 sets;10 reps;without UE support  yellow weighted ball      Knee/Hip Exercises: Supine   Bridges Both;15 reps   Bridges with [employer] Both;1 set;15 reps   Bridges with [employer] Both;1 set;15 reps     Modalities   Modalities Iontophoresis     Iontophoresis   Type of Iontophoresis Dexamethasone   Location right greater trochanter   Dose 4mA   Time 4 hour patch     Manual Therapy   Manual Therapy Soft tissue mobilization   Soft tissue mobilization Distal ITB area                  PT Short Term Goals - 10/02/16 1514      PT  SHORT TERM GOAL #1   Title independent with HEP   Time 2   Period Weeks   Status New           PT Long Term Goals - 10/02/16 1514      PT LONG TERM GOAL #1   Title independent with RICE   Time 8   Period Weeks   Status New     PT LONG TERM GOAL #2   Title increase strength of the right hip to 4+/6   Time 8   Period Weeks   Status New     PT LONG TERM GOAL #3   Title decrease pain 50%   Time 8   Period Weeks   Status New     PT LONG TERM GOAL #4   Title no difficulty with sitting or walking   Time 8   Period Weeks   Status New               Plan - 10/08/16 1509    Clinical Impression Statement Pt able to complete all of today's interventions well without c/o increase pain. Pt is tight in R ITB and piriformis. Pt remains tender in the GT and distal ITB area.   Rehab Potential Good   PT Frequency 2x / week   PT Duration 8 weeks   PT Treatment/Interventions Cryotherapy;Electrical  Stimulation;Iontophoresis 4mg /ml Dexamethasone;ADLs/Self Care Home Management;Ultrasound;Stair training;Functional mobility training;Therapeutic activities;Therapeutic exercise;Balance training;Neuromuscular re-education;Patient/family education;Manual techniques;Taping   PT Next Visit Plan Slowly work on strength, could do some ITB stripping      Patient will benefit from skilled therapeutic intervention in order to improve the following deficits and impairments:  Abnormal gait, Decreased range of motion, Decreased strength, Difficulty walking, Impaired flexibility, Pain  Visit Diagnosis: Difficulty in walking, not elsewhere classified  Pain in right hip     Problem List Patient Active Problem List   Diagnosis Date Noted  . Abnormal ECG 05/03/2015  . 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 OA (osteoarthritis) of knee 01/10/2015  . Arthritis of knee, degenerative 01/10/2015  . Bursitis, hip 01/08/2014  . Ecchymosis 12/29/2013  . Annual physical exam 11/14/2012  . FECAL INCONTINENCE 03/14/2010  . PERSONAL HX COLONIC POLYPS 03/14/2010  . ARTHRITIS, HANDS, BILATERAL 11/15/2009  . ARTHRITIS, KNEES, BILATERAL 12/07/2008  . HYPOTHYROIDISM 09/28/2008  . 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 OSTEOPENIA 09/28/2008    Scot Jun 10/08/2016, 3:12 PM  [place], [place], [address] Phone: [phone]   Fax:  [phone]  Name: JOLEY GHALI MRN: [id number] Date of Birth: 11-Nov-1946

## 2016-10-11 ENCOUNTER — Encounter: Payer: Self-pay | Admitting: Physical Therapy

## 2016-10-11 ENCOUNTER — Ambulatory Visit: Payer: Medicare Other | Admitting: Physical Therapy

## 2016-10-11 DIAGNOSIS — G8929 Other chronic pain: Secondary | ICD-10-CM

## 2016-10-11 DIAGNOSIS — M25551 Pain in right hip: Secondary | ICD-10-CM

## 2016-10-11 DIAGNOSIS — R262 Difficulty in walking, not elsewhere classified: Secondary | ICD-10-CM

## 2016-10-11 DIAGNOSIS — M7989 Other specified soft tissue disorders: Secondary | ICD-10-CM

## 2016-10-11 DIAGNOSIS — M25661 Stiffness of right knee, not elsewhere classified: Secondary | ICD-10-CM

## 2016-10-11 DIAGNOSIS — M25561 Pain in right knee: Secondary | ICD-10-CM

## 2016-10-11 NOTE — Therapy (Signed)
[place], [place], [address] Phone: [phone]   Fax:  [phone]  Physical Therapy Treatment  Patient Details  Name: Erin Carroll MRN: [id number] Date of Birth: 1946-08-05 Referring Provider: Andree Coss  Encounter Date: 10/11/2016      PT End of Session - 10/11/16 1426    Visit Number 3   Date for PT Re-Evaluation 12/02/16   PT Start Time 1345   PT Stop Time 1426   PT Time Calculation (min) 41 min   Activity Tolerance Patient tolerated treatment well   Behavior During Therapy [facility] for tasks assessed/performed      Past Medical History:  Diagnosis Date  . Alcoholism /alcohol abuse ([place])    sober since 37  . Arthritis    hands, knees.  . Hypothyroidism   . Thyroid disease    problems with thyroid    Past Surgical History:  Procedure Laterality Date  . BREAST BIOPSY  1977   left  . EXCISION/RELEASE BURSA HIP Left 01/08/2014   Procedure: LEFT HIP BURSECTOMY WITH GLUTEAL TENDON REPAIR;  Surgeon: [name], MD;  [facility];  Service: Orthopedics;  Laterality: Left;  With Anchors  . FINGER SURGERY Left 12-31-13   left middle finger cyst -aspirated  . METACARPOPHALANGEAL JOINT ARTHROPLASTY Bilateral    bilateral thumbs- Gramig  . RECTAL PROLAPSE REPAIR  MAY 2015  . right knee arthroscopy  1987/1991  . SHOULDER ARTHROSCOPY  2004  . TONSILLECTOMY    . TOTAL KNEE ARTHROPLASTY Left 01/10/2015   Procedure: LEFT TOTAL KNEE ARTHROPLASTY;  Surgeon: [name], MD;  [facility];  Service: Orthopedics;  Laterality: Left;    There were no vitals filed for this visit.      Subjective Assessment - 10/11/16 1346    Subjective "The hip was bothering me yesterday, but it is not as bad today" "i sat a lot yesterday"   Currently in Pain? Yes   Pain Score 2    Pain Location Hip   Pain Orientation Right                         [facility] Adult PT  Treatment/Exercise - 10/11/16 0001      Knee/Hip Exercises: Aerobic   Nustep NuStep L5 x7 min      Knee/Hip Exercises: Machines for Strengthening   Cybex Knee Extension 5lb 2x15   Cybex Knee Flexion 25lb 2x15   Other Machine Lats & Rows 25lb 2x10     Knee/Hip Exercises: Standing   Walking with Sports Cord 40lb side stepping x5 each     Knee/Hip Exercises: Supine   Other Supine Knee/Hip Exercises Bridges on physo ball , K2C x15    Other Supine Knee/Hip Exercises Hip abd/add 2x10     Modalities   Modalities Iontophoresis     Iontophoresis   Type of Iontophoresis Dexamethasone   Location right greater trochanter   Dose 54mA   Time 4 hour patch                  PT Short Term Goals - 10/11/16 1349      PT SHORT TERM GOAL #1   Title independent with HEP   Status Achieved           PT Long Term Goals - 10/11/16 1349      PT LONG TERM GOAL #1   Title independent with RICE  Status Achieved     PT LONG TERM GOAL #4   Title no difficulty with sitting or walking   Status Achieved               Plan - 10/11/16 1427    Clinical Impression Statement Pt continues to perform exercises well. Pt with some instability with resisted side step Pt reports that she could feel a burning sensation in both hips with resisted side steps.   Rehab Potential Good   PT Frequency 2x / week   PT Duration 8 weeks   PT Treatment/Interventions Cryotherapy;Electrical Stimulation;Iontophoresis 4mg /ml Dexamethasone;ADLs/Self Care Home Management;Ultrasound;Stair training;Functional mobility training;Therapeutic activities;Therapeutic exercise;Balance training;Neuromuscular re-education;Patient/family education;Manual techniques;Taping   PT Next Visit Plan Slowly work on strength, could do some ITB stripping      Patient will benefit from skilled therapeutic intervention in order to improve the following deficits and impairments:  Abnormal gait, Decreased range of motion,  Decreased strength, Difficulty walking, Impaired flexibility, Pain  Visit Diagnosis: Difficulty in walking, not elsewhere classified  Pain in right hip  Chronic pain of right knee  Knee stiffness, right  Swelling of limb     Problem List Patient Active Problem List   Diagnosis Date Noted  . Abnormal ECG 05/03/2015  . 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 OA (osteoarthritis) of knee 01/10/2015  . Arthritis of knee, degenerative 01/10/2015  . Bursitis, hip 01/08/2014  . Ecchymosis 12/29/2013  . Annual physical exam 11/14/2012  . FECAL INCONTINENCE 03/14/2010  . PERSONAL HX COLONIC POLYPS 03/14/2010  . ARTHRITIS, HANDS, BILATERAL 11/15/2009  . ARTHRITIS, KNEES, BILATERAL 12/07/2008  . HYPOTHYROIDISM 09/28/2008  . 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 OSTEOPENIA 09/28/2008    Scot Jun, PTA 10/11/2016, 2:31 PM  [place], [place], [address] Phone: [phone]   Fax:  [phone]  Name: Erin Carroll MRN: [id number] Date of Birth: 09-Jan-1946

## 2016-10-17 ENCOUNTER — Ambulatory Visit: Payer: Medicare Other | Admitting: Physical Therapy

## 2016-10-17 ENCOUNTER — Encounter: Payer: Self-pay | Admitting: Physical Therapy

## 2016-10-17 DIAGNOSIS — R262 Difficulty in walking, not elsewhere classified: Secondary | ICD-10-CM | POA: Diagnosis not present

## 2016-10-17 DIAGNOSIS — G8929 Other chronic pain: Secondary | ICD-10-CM

## 2016-10-17 DIAGNOSIS — M25551 Pain in right hip: Secondary | ICD-10-CM

## 2016-10-17 DIAGNOSIS — M25561 Pain in right knee: Secondary | ICD-10-CM

## 2016-10-17 NOTE — Therapy (Signed)
[place], [place], [address] Phone: [phone]   Fax:  [phone]  Physical Therapy Treatment  Patient Details  Name: Erin Carroll MRN: [id number] Date of Birth: [birth year] Referring Provider: Andree Coss  Encounter Date: 10/17/2016      PT End of Session - 10/17/16 1226    Visit Number 4   Date for PT Re-Evaluation 12/02/16   PT Start Time 1145   PT Stop Time 1227   PT Time Calculation (min) 42 min   Activity Tolerance Patient tolerated treatment well   Behavior During Therapy [facility] for tasks assessed/performed      Past Medical History:  Diagnosis Date  . Alcoholism /alcohol abuse ([place])    sober since 43  . Arthritis    hands, knees.  . Hypothyroidism   . Thyroid disease    problems with thyroid    Past Surgical History:  Procedure Laterality Date  . BREAST BIOPSY  1977   left  . EXCISION/RELEASE BURSA HIP Left 01/08/2014   Procedure: LEFT HIP BURSECTOMY WITH GLUTEAL TENDON REPAIR;  Surgeon: [name], MD;  [facility];  Service: Orthopedics;  Laterality: Left;  With Anchors  . FINGER SURGERY Left 12-31-13   left middle finger cyst -aspirated  . METACARPOPHALANGEAL JOINT ARTHROPLASTY Bilateral    bilateral thumbs- Gramig  . RECTAL PROLAPSE REPAIR  MAY 2015  . right knee arthroscopy  1987/1991  . SHOULDER ARTHROSCOPY  2004  . TONSILLECTOMY    . TOTAL KNEE ARTHROPLASTY Left 01/10/2015   Procedure: LEFT TOTAL KNEE ARTHROPLASTY;  Surgeon: [name], MD;  [facility];  Service: Orthopedics;  Laterality: Left;    There were no vitals filed for this visit.      Subjective Assessment - 10/17/16 1147    Subjective Pt reports that it didn't seem to bother her that much going up the steps on Monday    Currently in Pain? Yes   Pain Score 3    Pain Location Hip   Pain Orientation Right   Pain Descriptors / Indicators Aching                          [facility] Adult PT Treatment/Exercise - 10/17/16 0001      Knee/Hip Exercises: Aerobic   Elliptical I10 R5 x4 min   Nustep NuStep L5 x7 min      Knee/Hip Exercises: Machines for Strengthening   Cybex Leg Press 20lb 2x15      Knee/Hip Exercises: Standing   Hip ADduction Both;2 sets;10 reps   Hip ADduction Limitations 2lb   Hip Extension Both;2 sets;10 reps;Knee straight   Extension Limitations 2lb   Lateral Step Up 2 sets;10 reps;Right;Hand Hold: 0;Step Height: 4"   Forward Step Up 2 sets;Right;10 reps;Hand Hold: 0;Step Height: 6"  some increase in R hip pain during second set   Other Standing Knee Exercises Standing march 3lb 2x10     Knee/Hip Exercises: Seated   Sit to Sand 2 sets;10 reps;without UE support  8lb dumbell      Modalities   Modalities Iontophoresis     Iontophoresis   Type of Iontophoresis Dexamethasone   Location right greater trochanter   Dose 73mA   Time 4 hour patch     Manual Therapy   Manual Therapy Passive ROM   Soft tissue mobilization R hip  PT Short Term Goals - 10/11/16 1349      PT SHORT TERM GOAL #1   Title independent with HEP   Status Achieved           PT Long Term Goals - 10/11/16 1349      PT LONG TERM GOAL #1   Title independent with RICE   Status Achieved     PT LONG TERM GOAL #4   Title no difficulty with sitting or walking   Status Achieved               Plan - 10/17/16 1228    Clinical Impression Statement Pt does report some R pain with second set of step ups. Able to complete all interventions well. Reports R hip pain with going up steps.   Rehab Potential Good   PT Frequency 2x / week   PT Duration 8 weeks   PT Treatment/Interventions Cryotherapy;Electrical Stimulation;Iontophoresis 4mg /ml Dexamethasone;ADLs/Self Care Home Management;Ultrasound;Stair training;Functional mobility training;Therapeutic activities;Therapeutic exercise;Balance  training;Neuromuscular re-education;Patient/family education;Manual techniques;Taping   PT Next Visit Plan Slowly work on strength, could do some ITB stripping      Patient will benefit from skilled therapeutic intervention in order to improve the following deficits and impairments:  Abnormal gait, Decreased range of motion, Decreased strength, Difficulty walking, Impaired flexibility, Pain  Visit Diagnosis: Difficulty in walking, not elsewhere classified  Pain in right hip  Chronic pain of right knee     Problem List Patient Active Problem List   Diagnosis Date Noted  . Abnormal ECG 05/03/2015  . 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 OA (osteoarthritis) of knee 01/10/2015  . Arthritis of knee, degenerative 01/10/2015  . Bursitis, hip 01/08/2014  . Ecchymosis 12/29/2013  . Annual physical exam 11/14/2012  . FECAL INCONTINENCE 03/14/2010  . PERSONAL HX COLONIC POLYPS 03/14/2010  . ARTHRITIS, HANDS, BILATERAL 11/15/2009  . ARTHRITIS, KNEES, BILATERAL 12/07/2008  . HYPOTHYROIDISM 09/28/2008  . 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 OSTEOPENIA 09/28/2008    Scot Jun, PTA 10/17/2016, 12:30 PM  [place], [place], [address] Phone: [phone]   Fax:  [phone]  Name: Erin Carroll MRN: [id number] Date of Birth: [birth year]

## 2016-10-19 ENCOUNTER — Ambulatory Visit: Payer: Medicare Other | Admitting: Physical Therapy

## 2016-10-19 ENCOUNTER — Encounter: Payer: Medicare Other | Admitting: Physical Therapy

## 2016-10-19 ENCOUNTER — Encounter: Payer: Self-pay | Admitting: Physical Therapy

## 2016-10-19 DIAGNOSIS — M25551 Pain in right hip: Secondary | ICD-10-CM

## 2016-10-19 DIAGNOSIS — R262 Difficulty in walking, not elsewhere classified: Secondary | ICD-10-CM | POA: Diagnosis not present

## 2016-10-19 NOTE — Therapy (Signed)
[place], [place], [address] Phone: [phone]   Fax:  [phone]  Physical Therapy Treatment  Patient Details  Name: Erin Carroll MRN: [id number] Date of Birth: August 11, 1946 Referring Provider: Andree Coss  Encounter Date: 10/19/2016      PT End of Session - 10/19/16 0845    Visit Number 5   Date for PT Re-Evaluation 12/02/16   PT Start Time 0800   PT Stop Time 0850   PT Time Calculation (min) 50 min      Past Medical History:  Diagnosis Date  . Alcoholism /alcohol abuse ([place])    sober since 74  . Arthritis    hands, knees.  . Hypothyroidism   . Thyroid disease    problems with thyroid    Past Surgical History:  Procedure Laterality Date  . BREAST BIOPSY  1977   left  . EXCISION/RELEASE BURSA HIP Left 01/08/2014   Procedure: LEFT HIP BURSECTOMY WITH GLUTEAL TENDON REPAIR;  Surgeon: [name], MD;  Location: [facility];  Service: Orthopedics;  Laterality: Left;  With Anchors  . FINGER SURGERY Left 12-31-13   left middle finger cyst -aspirated  . METACARPOPHALANGEAL JOINT ARTHROPLASTY Bilateral    bilateral thumbs- Gramig  . RECTAL PROLAPSE REPAIR  MAY 2015  . right knee arthroscopy  1987/1991  . SHOULDER ARTHROSCOPY  2004  . TONSILLECTOMY    . TOTAL KNEE ARTHROPLASTY Left 01/10/2015   Procedure: LEFT TOTAL KNEE ARTHROPLASTY;  Surgeon: [name], MD;  Location: [facility];  Service: Orthopedics;  Laterality: Left;    There were no vitals filed for this visit.      Subjective Assessment - 10/19/16 0800    Subjective last couple days have felt better   Currently in Pain? No/denies                         [facility] Adult PT Treatment/Exercise - 10/19/16 0001      Knee/Hip Exercises: Aerobic   Tread Mill 1mph 1 min each side   Nustep NuStep L5 x7 min      Knee/Hip Exercises: Machines for Strengthening   Cybex Leg Press 20# 3 sets 10  feet 3 different  positions     Knee/Hip Exercises: Supine   Bridges Strengthening;Both;20 reps  with ball   Straight Leg Raises Right;2 sets;10 reps  red tband with abd   Other Supine Knee/Hip Exercises hip abd/add 2 step pause 15 times red tband   Other Supine Knee/Hip Exercises ball squueze 15      Iontophoresis   Type of Iontophoresis Dexamethasone   Location right greater trochanter   Dose 31mA   Time #5     Manual Therapy   Manual Therapy Soft tissue mobilization;Taping   Manual therapy comments foam rolling   Soft tissue mobilization ITB stripping   Kinesiotex Create Space                PT Education - 10/19/16 0844    Education provided Yes   Education Details ITB stripping with foam roll and or rolling pin with spouse   Person(s) Educated Patient   Methods Explanation;Demonstration   Comprehension Verbalized understanding          PT Short Term Goals - 10/11/16 1349      PT SHORT TERM GOAL #1   Title independent with HEP   Status Achieved  PT Long Term Goals - 10/19/16 0811      PT LONG TERM GOAL #2   Title increase strength of the right hip to 4+/6   Baseline hips 4/5   Status On-going     PT LONG TERM GOAL #3   Title decrease pain 50%   Baseline 30% better   Status On-going               Plan - 10/19/16 0846    Clinical Impression Statement very tight and tender ITB, educated in foam rolling at home and trial of kiniseo tape to creat space.   PT Next Visit Plan assess tape and progress      Patient will benefit from skilled therapeutic intervention in order to improve the following deficits and impairments:  Abnormal gait, Decreased range of motion, Decreased strength, Difficulty walking, Impaired flexibility, Pain  Visit Diagnosis: Difficulty in walking, not elsewhere classified  Pain in right hip     Problem List Patient Active Problem List   Diagnosis Date Noted  . Abnormal ECG 05/03/2015  . LAFB (left anterior  fascicular block) 05/03/2015  . OA (osteoarthritis) of knee 01/10/2015  . Arthritis of knee, degenerative 01/10/2015  . Bursitis, hip 01/08/2014  . Ecchymosis 12/29/2013  . Annual physical exam 11/14/2012  . FECAL INCONTINENCE 03/14/2010  . PERSONAL HX COLONIC POLYPS 03/14/2010  . ARTHRITIS, HANDS, BILATERAL 11/15/2009  . ARTHRITIS, KNEES, BILATERAL 12/07/2008  . HYPOTHYROIDISM 09/28/2008  . 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 OSTEOPENIA 09/28/2008    Shashwat Cleary,ANGIE PTA 10/19/2016, 8:47 AM  [place], [place], [address] Phone: [phone]   Fax:  [phone]  Name: Erin Carroll MRN: [id number] Date of Birth: [birth year]

## 2016-10-22 ENCOUNTER — Encounter: Payer: Self-pay | Admitting: Physical Therapy

## 2016-10-22 ENCOUNTER — Ambulatory Visit: Payer: Medicare Other | Admitting: Physical Therapy

## 2016-10-22 DIAGNOSIS — R262 Difficulty in walking, not elsewhere classified: Secondary | ICD-10-CM | POA: Diagnosis not present

## 2016-10-22 DIAGNOSIS — M25551 Pain in right hip: Secondary | ICD-10-CM

## 2016-10-22 NOTE — Therapy (Signed)
[place], [place], [address] Phone: [phone]   Fax:  [phone]  Physical Therapy Treatment  Patient Details  Name: Erin Carroll MRN: [id number] Date of Birth: [birth year] Referring Provider: Andree Coss  Encounter Date: 10/22/2016      PT End of Session - 10/22/16 0939    Visit Number 6   Date for PT Re-Evaluation 12/02/16   PT Start Time 0845   PT Stop Time 0935   PT Time Calculation (min) 50 min      Past Medical History:  Diagnosis Date  . Alcoholism /alcohol abuse ([place])    sober since 27  . Arthritis    hands, knees.  . Hypothyroidism   . Thyroid disease    problems with thyroid    Past Surgical History:  Procedure Laterality Date  . BREAST BIOPSY  1977   left  . EXCISION/RELEASE BURSA HIP Left 01/08/2014   Procedure: LEFT HIP BURSECTOMY WITH GLUTEAL TENDON REPAIR;  Surgeon: [name], MD;  [facility];  Service: Orthopedics;  Laterality: Left;  With Anchors  . FINGER SURGERY Left 12-31-13   left middle finger cyst -aspirated  . METACARPOPHALANGEAL JOINT ARTHROPLASTY Bilateral    bilateral thumbs- Gramig  . RECTAL PROLAPSE REPAIR  MAY 2015  . right knee arthroscopy  1987/1991  . SHOULDER ARTHROSCOPY  2004  . TONSILLECTOMY    . TOTAL KNEE ARTHROPLASTY Left 01/10/2015   Procedure: LEFT TOTAL KNEE ARTHROPLASTY;  Surgeon: [name], MD;  [facility];  Service: Orthopedics;  Laterality: Left;    There were no vitals filed for this visit.      Subjective Assessment - 10/22/16 0921    Subjective doing some better, my husband has been rolling me   Currently in Pain? Yes   Pain Score 3    Pain Location Leg   Pain Orientation Right;Lateral                         [facility] Adult PT Treatment/Exercise - 10/22/16 0001      Modalities   Modalities Ultrasound;Moist Heat;Electrical Stimulation     Moist Heat Therapy   Number Minutes  Moist Heat 15 Minutes   Moist Heat Location Hip     Electrical Stimulation   Electrical Stimulation Location ITB   Electrical Stimulation Action IFC   Electrical Stimulation Goals Pain     Ultrasound   Ultrasound Location RT ITB    Ultrasound Parameters COMBO with Estim   Ultrasound Goals Pain     Manual Therapy   Manual Therapy Soft tissue mobilization;Taping;Other (comment)   Manual therapy comments DSTW to ITB   Soft tissue mobilization ITB stripping   Kinesiotex Create Space                PT Education - 10/22/16 [phone]    Education provided Yes   Education Details coninue with foam rolling at home   Person(s) Educated Patient   Comprehension Verbalized understanding          PT Short Term Goals - 10/11/16 1349      PT SHORT TERM GOAL #1   Title independent with HEP   Status Achieved           PT Long Term Goals - 10/19/16 [id number]      PT LONG TERM GOAL #2   Title increase strength of the right hip to 4+/6  Baseline hips 4/5   Status On-going     PT LONG TERM GOAL #3   Title decrease pain 50%   Baseline 30% better   Status On-going               Plan - 10/22/16 0940    Clinical Impression Statement pt still very tight with trigger pts in RT ITB as well as lat calf,decreased 25% from last session from STW here and at home. Added combo and estim with STW and taping to decrease tightness.    PT Next Visit Plan MT and modaliies      Patient will benefit from skilled therapeutic intervention in order to improve the following deficits and impairments:  Abnormal gait, Decreased range of motion, Decreased strength, Difficulty walking, Impaired flexibility, Pain  Visit Diagnosis: Difficulty in walking, not elsewhere classified  Pain in right hip     Problem List Patient Active Problem List   Diagnosis Date Noted  . Abnormal ECG 05/03/2015  . 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 OA (osteoarthritis) of knee 01/10/2015  .  Arthritis of knee, degenerative 01/10/2015  . Bursitis, hip 01/08/2014  . Ecchymosis 12/29/2013  . Annual physical exam 11/14/2012  . FECAL INCONTINENCE 03/14/2010  . PERSONAL HX COLONIC POLYPS 03/14/2010  . ARTHRITIS, HANDS, BILATERAL 11/15/2009  . ARTHRITIS, KNEES, BILATERAL 12/07/2008  . HYPOTHYROIDISM 09/28/2008  . 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 OSTEOPENIA 09/28/2008    Shogo Larkey,ANGIE PTA 10/22/2016, 9:42 AM  [place], [place], [address] Phone: [phone]   Fax:  [phone]  Name: TRINAE ALVARES MRN: [id number] Date of Birth: [birth year]

## 2016-10-29 ENCOUNTER — Encounter: Payer: Self-pay | Admitting: Physical Therapy

## 2016-10-29 ENCOUNTER — Ambulatory Visit: Payer: Medicare Other | Admitting: Physical Therapy

## 2016-10-29 DIAGNOSIS — R262 Difficulty in walking, not elsewhere classified: Secondary | ICD-10-CM | POA: Diagnosis not present

## 2016-10-29 DIAGNOSIS — M25561 Pain in right knee: Secondary | ICD-10-CM

## 2016-10-29 DIAGNOSIS — G8929 Other chronic pain: Secondary | ICD-10-CM

## 2016-10-29 DIAGNOSIS — M25551 Pain in right hip: Secondary | ICD-10-CM

## 2016-10-29 NOTE — Therapy (Signed)
[place], [place], [address] Phone: [phone]   Fax:  [phone]  Physical Therapy Treatment  Patient Details  Name: Erin Carroll MRN: [id number] Date of Birth: 1946/07/11 Referring Provider: Andree Coss  Encounter Date: 10/29/2016      PT End of Session - 10/29/16 0836    Visit Number 7   Date for PT Re-Evaluation 12/02/16   PT Start Time 0800   PT Stop Time 0848   PT Time Calculation (min) 48 min   Activity Tolerance Patient tolerated treatment well   Behavior During Therapy [facility] for tasks assessed/performed      Past Medical History:  Diagnosis Date  . Alcoholism /alcohol abuse ([place])    sober since 48  . Arthritis    hands, knees.  . Hypothyroidism   . Thyroid disease    problems with thyroid    Past Surgical History:  Procedure Laterality Date  . BREAST BIOPSY  1977   left  . EXCISION/RELEASE BURSA HIP Left 01/08/2014   Procedure: LEFT HIP BURSECTOMY WITH GLUTEAL TENDON REPAIR;  Surgeon: [name], MD;  [facility];  Service: Orthopedics;  Laterality: Left;  With Anchors  . FINGER SURGERY Left 12-31-13   left middle finger cyst -aspirated  . METACARPOPHALANGEAL JOINT ARTHROPLASTY Bilateral    bilateral thumbs- Gramig  . RECTAL PROLAPSE REPAIR  MAY 2015  . right knee arthroscopy  1987/1991  . SHOULDER ARTHROSCOPY  2004  . TONSILLECTOMY    . TOTAL KNEE ARTHROPLASTY Left 01/10/2015   Procedure: LEFT TOTAL KNEE ARTHROPLASTY;  Surgeon: [name], MD;  [facility];  Service: Orthopedics;  Laterality: Left;    There were no vitals filed for this visit.      Subjective Assessment - 10/29/16 0803    Subjective "My husband has been rolling it twice a day, it seems to be helping"   Currently in Pain? Yes   Pain Location Buttocks  Lateral R knee 3-4/10   Pain Orientation Right                         [facility] Adult PT Treatment/Exercise -  10/29/16 0001      Knee/Hip Exercises: Aerobic   Nustep NuStep L5 x7 min      Modalities   Modalities Ultrasound;Moist Heat;Electrical Stimulation     Moist Heat Therapy   Number Minutes Moist Heat 15 Minutes   Moist Heat Location Hip     Electrical Stimulation   Electrical Stimulation Location ITB   Electrical Stimulation Goals Pain     Ultrasound   Ultrasound Location RT ITB   Ultrasound Parameters 100% 1MHz 1.1w/cm2     Manual Therapy   Manual Therapy Soft tissue mobilization   Manual therapy comments DSTW to ITB                  PT Short Term Goals - 10/11/16 1349      PT SHORT TERM GOAL #1   Title independent with HEP   Status Achieved           PT Long Term Goals - 10/19/16 [id number]      PT LONG TERM GOAL #2   Title increase strength of the right hip to 4+/6   Baseline hips 4/5   Status On-going     PT LONG TERM GOAL #3   Title decrease pain 50%   Baseline 30%  better   Status On-going               Plan - 10/29/16 0837    Clinical Impression Statement Pt reports an overall improvement from last session. She does reports some pain in R buttocks that she thinks comes from blowing leaves this weekend. Continued with identical treatment as last session due to pt positive response.   Rehab Potential Good   PT Frequency 2x / week   PT Duration 8 weeks   PT Treatment/Interventions Cryotherapy;Electrical Stimulation;Iontophoresis 4mg /ml Dexamethasone;ADLs/Self Care Home Management;Ultrasound;Stair training;Functional mobility training;Therapeutic activities;Therapeutic exercise;Balance training;Neuromuscular re-education;Patient/family education;Manual techniques;Taping   PT Next Visit Plan MT and modalities      Patient will benefit from skilled therapeutic intervention in order to improve the following deficits and impairments:  Abnormal gait, Decreased range of motion, Decreased strength, Difficulty walking, Impaired flexibility,  Pain  Visit Diagnosis: Difficulty in walking, not elsewhere classified  Pain in right hip  Chronic pain of right knee     Problem List Patient Active Problem List   Diagnosis Date Noted  . Abnormal ECG 05/03/2015  . 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 OA (osteoarthritis) of knee 01/10/2015  . Arthritis of knee, degenerative 01/10/2015  . Bursitis, hip 01/08/2014  . Ecchymosis 12/29/2013  . Annual physical exam 11/14/2012  . FECAL INCONTINENCE 03/14/2010  . PERSONAL HX COLONIC POLYPS 03/14/2010  . ARTHRITIS, HANDS, BILATERAL 11/15/2009  . ARTHRITIS, KNEES, BILATERAL 12/07/2008  . HYPOTHYROIDISM 09/28/2008  . 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 OSTEOPENIA 09/28/2008    Scot Jun, PTA 10/29/2016, 8:40 AM  [place], [place], [address] Phone: [phone]   Fax:  [phone]  Name: Erin Carroll MRN: [id number] Date of Birth: 07-Nov-1946

## 2016-11-01 ENCOUNTER — Encounter: Payer: Self-pay | Admitting: Physical Therapy

## 2016-11-01 ENCOUNTER — Ambulatory Visit: Payer: Medicare Other | Admitting: Physical Therapy

## 2016-11-01 DIAGNOSIS — G8929 Other chronic pain: Secondary | ICD-10-CM

## 2016-11-01 DIAGNOSIS — R262 Difficulty in walking, not elsewhere classified: Secondary | ICD-10-CM | POA: Diagnosis not present

## 2016-11-01 DIAGNOSIS — M25551 Pain in right hip: Secondary | ICD-10-CM

## 2016-11-01 DIAGNOSIS — M25561 Pain in right knee: Secondary | ICD-10-CM

## 2016-11-01 NOTE — Therapy (Signed)
[place], [place], [address] Phone: [phone]   Fax:  [phone]  Physical Therapy Treatment  Patient Details  Name: Erin Carroll MRN: [id number] Date of Birth: 1946/02/29 Referring Provider: Andree Coss  Encounter Date: 11/01/2016      PT End of Session - 11/01/16 1532    Visit Number 8   Date for PT Re-Evaluation 12/02/16   PT Start Time 1445   PT Stop Time 1540   PT Time Calculation (min) 55 min      Past Medical History:  Diagnosis Date  . Alcoholism /alcohol abuse ([place])    sober since 88  . Arthritis    hands, knees.  . Hypothyroidism   . Thyroid disease    problems with thyroid    Past Surgical History:  Procedure Laterality Date  . BREAST BIOPSY  1977   left  . EXCISION/RELEASE BURSA HIP Left 01/08/2014   Procedure: LEFT HIP BURSECTOMY WITH GLUTEAL TENDON REPAIR;  Surgeon: [name], MD;  [facility];  Service: Orthopedics;  Laterality: Left;  With Anchors  . FINGER SURGERY Left 12-31-13   left middle finger cyst -aspirated  . METACARPOPHALANGEAL JOINT ARTHROPLASTY Bilateral    bilateral thumbs- Gramig  . RECTAL PROLAPSE REPAIR  MAY 2015  . right knee arthroscopy  1987/1991  . SHOULDER ARTHROSCOPY  2004  . TONSILLECTOMY    . TOTAL KNEE ARTHROPLASTY Left 01/10/2015   Procedure: LEFT TOTAL KNEE ARTHROPLASTY;  Surgeon: [name], MD;  [facility];  Service: Orthopedics;  Laterality: Left;    There were no vitals filed for this visit.      Subjective Assessment - 11/01/16 1450    Subjective 20% better   Currently in Pain? Yes   Pain Score 3    Pain Location Hip   Pain Orientation Right                         [facility] Adult PT Treatment/Exercise - 11/01/16 0001      Knee/Hip Exercises: Aerobic   Elliptical I 8 R 5 3 fwd/3 back     Knee/Hip Exercises: Machines for Strengthening   Cybex Leg Press 30# 3 sets 12, feet 3 ways      Knee/Hip Exercises: Standing   Lateral Step Up Both;1 set;15 reps;Step Height: 6";Hand Hold: 0  opp leg abd   Functional Squat 10 reps  2 sets on sit fit   Other Standing Knee Exercises monster walk     Moist Heat Therapy   Number Minutes Moist Heat 15 Minutes   Moist Heat Location Hip;Knee     Electrical Stimulation   Electrical Stimulation Location ITB/peroneals   Electrical Stimulation Action IFC   Electrical Stimulation Goals Pain     Manual Therapy   Manual Therapy Soft tissue mobilization   Manual therapy comments DSTW to ITB and peroneals                  PT Short Term Goals - 10/11/16 1349      PT SHORT TERM GOAL #1   Title independent with HEP   Status Achieved           PT Long Term Goals - 11/01/16 1531      PT LONG TERM GOAL #1   Title independent with RICE   Status Achieved     PT LONG TERM GOAL #2   Title  increase strength of the right hip to 4+/6   Status On-going     PT LONG TERM GOAL #3   Title decrease pain 50%   Status On-going     PT LONG TERM GOAL #4   Title no difficulty with sitting or walking   Status Achieved               Plan - 11/01/16 1533    Clinical Impression Statement decreased knots in ITB, tender over GT and IB, very painful with knots in RT peroneals. Pt verb only 20% better and going partial met- referred for DN. MD f/u 12/14   PT Next Visit Plan DN 12/5 3:45 PT emailed      Patient will benefit from skilled therapeutic intervention in order to improve the following deficits and impairments:  Abnormal gait, Decreased range of motion, Decreased strength, Difficulty walking, Impaired flexibility, Pain  Visit Diagnosis: Difficulty in walking, not elsewhere classified  Pain in right hip  Chronic pain of right knee     Problem List Patient Active Problem List   Diagnosis Date Noted  . Abnormal ECG 05/03/2015  . 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 OA (osteoarthritis) of  knee 01/10/2015  . Arthritis of knee, degenerative 01/10/2015  . Bursitis, hip 01/08/2014  . Ecchymosis 12/29/2013  . Annual physical exam 11/14/2012  . FECAL INCONTINENCE 03/14/2010  . PERSONAL HX COLONIC POLYPS 03/14/2010  . ARTHRITIS, HANDS, BILATERAL 11/15/2009  . ARTHRITIS, KNEES, BILATERAL 12/07/2008  . HYPOTHYROIDISM 09/28/2008  . 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 OSTEOPENIA 09/28/2008    PAYSEUR,ANGIE PTA 11/01/2016, 3:34 PM  [place], [place], [address] Phone: [phone]   Fax:  [phone]  Name: Erin Carroll MRN: [id number] Date of Birth: [birth year]

## 2016-11-06 ENCOUNTER — Ambulatory Visit: Payer: Medicare Other | Attending: Orthopedic Surgery | Admitting: Physical Therapy

## 2016-11-06 ENCOUNTER — Ambulatory Visit: Payer: Medicare Other | Admitting: Physical Therapy

## 2016-11-06 DIAGNOSIS — G8929 Other chronic pain: Secondary | ICD-10-CM | POA: Diagnosis present

## 2016-11-06 DIAGNOSIS — M25561 Pain in right knee: Secondary | ICD-10-CM | POA: Insufficient documentation

## 2016-11-06 DIAGNOSIS — M25661 Stiffness of right knee, not elsewhere classified: Secondary | ICD-10-CM | POA: Insufficient documentation

## 2016-11-06 DIAGNOSIS — M25551 Pain in right hip: Secondary | ICD-10-CM

## 2016-11-06 DIAGNOSIS — R262 Difficulty in walking, not elsewhere classified: Secondary | ICD-10-CM | POA: Insufficient documentation

## 2016-11-06 NOTE — Therapy (Signed)
[place], [place], [address] Phone: [phone]   Fax:  [phone]  Physical Therapy Treatment  Patient Details  Name: Erin Carroll MRN: [id number] Date of Birth: May 05, 1946 Referring Provider: Andree Coss  Encounter Date: 11/06/2016      PT End of Session - 11/06/16 1746    Visit Number 9   Date for PT Re-Evaluation 12/02/16   PT Start Time 1545   PT Stop Time 1638   PT Time Calculation (min) 53 min   Activity Tolerance Patient tolerated treatment well   Behavior During Therapy [facility] for tasks assessed/performed      Past Medical History:  Diagnosis Date  . 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 Arthritis    hands, knees.  . Hypothyroidism   . Thyroid disease    problems with thyroid    Past Surgical History:  Procedure Laterality Date  . BREAST BIOPSY  1977   left  . EXCISION/RELEASE BURSA HIP Left 01/08/2014   Procedure: LEFT HIP BURSECTOMY WITH GLUTEAL TENDON REPAIR;  Surgeon: [name], MD;  [facility];  Service: Orthopedics;  Laterality: Left;  With Anchors  . FINGER SURGERY Left 12-31-13   left middle finger cyst -aspirated  . METACARPOPHALANGEAL JOINT ARTHROPLASTY Bilateral    bilateral thumbs- Gramig  . RECTAL PROLAPSE REPAIR  MAY 2015  . right knee arthroscopy  1987/1991  . SHOULDER ARTHROSCOPY  2004  . TONSILLECTOMY    . TOTAL KNEE ARTHROPLASTY Left 01/10/2015   Procedure: LEFT TOTAL KNEE ARTHROPLASTY;  Surgeon: [name], MD;  [facility];  Service: Orthopedics;  Laterality: Left;    There were no vitals filed for this visit.      Subjective Assessment - 11/06/16 1551    Subjective "hip is doing about the same, mos of the soreness in is located just distal to the knee laterally"   Currently in Pain? Yes   Pain Score 4    Pain Location Hip   Pain Orientation Right   Pain Descriptors / Indicators Aching;Sore;Tightness   Pain Type  Chronic pain   Aggravating Factors  sitting, and driving, crossing the leg over'    Pain Relieving Factors ice helps, resting,                          [facility] Adult PT Treatment/Exercise - 11/06/16 0001      Knee/Hip Exercises: Stretches   Piriformis Stretch 3 reps;30 seconds  contract/ relax with 10 sec contraction     Moist Heat Therapy   Number Minutes Moist Heat 10 Minutes   Moist Heat Location Hip;Knee     Manual Therapy   Manual Therapy Joint mobilization   Manual therapy comments tack and stretch of the piriformis   Joint Mobilization posterior hip mobilization on the L grade 4, long axis distractoin grade 4   Soft tissue mobilization IASTM over the glute glute medius piriformis          Trigger Point Dry Needling - 11/06/16 1608    Consent Given? Yes   Education Handout Provided Yes   Muscles Treated Lower Body Gluteus minimus;Piriformis;Quadriceps   Gluteus Minimus Response Twitch response elicited;Palpable increased muscle length  glute medius   Piriformis Response Twitch response elicited;Palpable increased muscle length              PT Education - 11/06/16 1746    Education provided  Yes   Education Details anatomy of muscle and formation of trigger points. what TPDN is, what to expect and after care.    Person(s) Educated Patient   Methods Explanation;Verbal cues   Comprehension Verbalized understanding;Verbal cues required          PT Short Term Goals - 10/11/16 1349      PT SHORT TERM GOAL #1   Title independent with HEP   Status Achieved           PT Long Term Goals - 11/01/16 1531      PT LONG TERM GOAL #1   Title independent with RICE   Status Achieved     PT LONG TERM GOAL #2   Title increase strength of the right hip to 4+/6   Status On-going     PT LONG TERM GOAL #3   Title decrease pain 50%   Status On-going     PT LONG TERM GOAL #4   Title no difficulty with sitting or walking   Status Achieved                Plan - 11/06/16 1747    Clinical Impression Statement Erin Carroll reports 4/10 pain at the hip with the most pain distally located laterally just distal to the tibiofemoral joint line. DN was performed on the glute medius and piriformis; pt reported reproduction of symptoms with DN. Follwoing perofrmed stretching and IASTM  to the glute med and piriformis. Following treatment pt was able to perform walking and sitting using her R foot mimicing driving with no pain. utilized MHp for TPDN soreness post session she reported 0/10 pain.    PT Treatment/Interventions Cryotherapy;Electrical Stimulation;Iontophoresis 4mg /ml Dexamethasone;ADLs/Self Care Home Management;Ultrasound;Stair training;Functional mobility training;Therapeutic activities;Therapeutic exercise;Balance training;Neuromuscular re-education;Patient/family education;Manual techniques;Taping   PT Next Visit Plan assess response to DN, hip posterior mobs, and long axis distraction, glute med and piriformis stretching,    Consulted and Agree with Plan of Care Patient      Patient will benefit from skilled therapeutic intervention in order to improve the following deficits and impairments:  Abnormal gait, Decreased range of motion, Decreased strength, Difficulty walking, Impaired flexibility, Pain  Visit Diagnosis: Difficulty in walking, not elsewhere classified  Pain in right hip  Chronic pain of right knee  Knee stiffness, right     Problem List Patient Active Problem List   Diagnosis Date Noted  . Abnormal ECG 05/03/2015  . 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 OA (osteoarthritis) of knee 01/10/2015  . Arthritis of knee, degenerative 01/10/2015  . Bursitis, hip 01/08/2014  . Ecchymosis 12/29/2013  . Annual physical exam 11/14/2012  . FECAL INCONTINENCE 03/14/2010  . PERSONAL HX COLONIC POLYPS 03/14/2010  . ARTHRITIS, HANDS, BILATERAL 11/15/2009  . ARTHRITIS, KNEES, BILATERAL 12/07/2008   . HYPOTHYROIDISM 09/28/2008  . 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 OSTEOPENIA 09/28/2008   Erin Carroll PT, DPT, LAT, ATC  11/06/16  5:57 PM      [place] [address] [place], [place], [address] Phone: [phone]   Fax:  [phone]  Name: Erin Carroll MRN: [id number] Date of Birth: [birth year]

## 2016-11-09 ENCOUNTER — Ambulatory Visit: Payer: Medicare Other | Admitting: Physical Therapy

## 2016-11-14 ENCOUNTER — Ambulatory Visit: Payer: Medicare Other | Admitting: Physical Therapy

## 2016-11-14 DIAGNOSIS — R262 Difficulty in walking, not elsewhere classified: Secondary | ICD-10-CM | POA: Diagnosis not present

## 2016-11-14 DIAGNOSIS — M25661 Stiffness of right knee, not elsewhere classified: Secondary | ICD-10-CM

## 2016-11-14 DIAGNOSIS — M25551 Pain in right hip: Secondary | ICD-10-CM

## 2016-11-14 DIAGNOSIS — M25561 Pain in right knee: Secondary | ICD-10-CM

## 2016-11-14 DIAGNOSIS — G8929 Other chronic pain: Secondary | ICD-10-CM

## 2016-11-14 NOTE — Therapy (Signed)
[place], [place], [address] Phone: [phone]   Fax:  [phone]  Physical Therapy Treatment  Patient Details  Name: Erin Carroll MRN: [id number] Date of Birth: [birth year] Referring Provider: Andree Coss  Encounter Date: 11/14/2016      PT End of Session - 11/14/16 1457    Visit Number 10   Date for PT Re-Evaluation 12/02/16   PT Start Time 1416   PT Stop Time 1502   PT Time Calculation (min) 46 min   Activity Tolerance Patient tolerated treatment well   Behavior During Therapy [facility] for tasks assessed/performed      Past Medical History:  Diagnosis Date  . Alcoholism /alcohol abuse ([place])    sober since 49  . Arthritis    hands, knees.  . Hypothyroidism   . Thyroid disease    problems with thyroid    Past Surgical History:  Procedure Laterality Date  . BREAST BIOPSY  1977   left  . EXCISION/RELEASE BURSA HIP Left 01/08/2014   Procedure: LEFT HIP BURSECTOMY WITH GLUTEAL TENDON REPAIR;  Surgeon: [name], MD;  [facility]: [facility];  Service: Orthopedics;  Laterality: Left;  With Anchors  . FINGER SURGERY Left 12-31-13   left middle finger cyst -aspirated  . METACARPOPHALANGEAL JOINT ARTHROPLASTY Bilateral    bilateral thumbs- Gramig  . RECTAL PROLAPSE REPAIR  MAY 2015  . right knee arthroscopy  1987/1991  . SHOULDER ARTHROSCOPY  2004  . TONSILLECTOMY    . TOTAL KNEE ARTHROPLASTY Left 01/10/2015   Procedure: LEFT TOTAL KNEE ARTHROPLASTY;  Surgeon: [name], MD;  [facility];  Service: Orthopedics;  Laterality: Left;    There were no vitals filed for this visit.      Subjective Assessment - 11/14/16 1417    Subjective " The TPDN felt pretty amazing, some twinges but nothing like it had been"    Currently in Pain? Yes   Pain Score 0-No pain   Pain Location Hip   Pain Orientation Right                         [facility] Adult PT Treatment/Exercise -  11/14/16 1430      Knee/Hip Exercises: Stretches   Piriformis Stretch 3 reps;30 seconds     Knee/Hip Exercises: Sidelying   Hip ABduction Right;Strengthening;2 sets;15 reps   Other Sidelying Knee/Hip Exercises reverse clams 2 x 15     Ultrasound   Ultrasound Location R piriformis   Ultrasound Parameters 100%, 44mhz at 1.5 w/cm2   Ultrasound Goals Other (Comment);Pain     Manual Therapy   Manual therapy comments tack and stretch of the piriformis   Joint Mobilization posterior hip mobilization on the L grade 4, long axis distractoin grade 4   Soft tissue mobilization IASTM over the glute medius and piriformis          Trigger Point Dry Needling - 11/14/16 1429    Consent Given? Yes   Education Handout Provided Yes   Piriformis Response Twitch response elicited;Palpable increased muscle length                PT Short Term Goals - 10/11/16 1349      PT SHORT TERM GOAL #1   Title independent with HEP   Status Achieved           PT Long Term Goals - 11/01/16 1531      PT LONG TERM  GOAL #1   Title independent with RICE   Status Achieved     PT LONG TERM GOAL #2   Title increase strength of the right hip to 4+/6   Status On-going     PT LONG TERM GOAL #3   Title decrease pain 50%   Status On-going     PT LONG TERM GOAL #4   Title no difficulty with sitting or walking   Status Achieved               Plan - 11/14/16 1457    Clinical Impression Statement pt reports significant relief of pain in R LE to 0/10 today, DN was performed on the piriformis with multiple twitches and relief of tightness. utilized [place] over the piriformis which she reported continued relief. she tolerated exercise well with no report of soreness just fatigue.    PT Treatment/Interventions Cryotherapy;Electrical Stimulation;Iontophoresis 4mg /ml Dexamethasone;ADLs/Self Care Home Management;Ultrasound;Stair training;Functional mobility training;Therapeutic activities;Therapeutic  exercise;Balance training;Neuromuscular re-education;Patient/family education;Manual techniques;Taping   PT Next Visit Plan assess response to DN, hip posterior mobs, and long axis distraction, glute med and piriformis stretching,    Consulted and Agree with Plan of Care Patient      Patient will benefit from skilled therapeutic intervention in order to improve the following deficits and impairments:  Abnormal gait, Decreased range of motion, Decreased strength, Difficulty walking, Impaired flexibility, Pain  Visit Diagnosis: Difficulty in walking, not elsewhere classified  Pain in right hip  Chronic pain of right knee  Knee stiffness, right     Problem List Patient Active Problem List   Diagnosis Date Noted  . Abnormal ECG 05/03/2015  . 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 OA (osteoarthritis) of knee 01/10/2015  . Arthritis of knee, degenerative 01/10/2015  . Bursitis, hip 01/08/2014  . Ecchymosis 12/29/2013  . Annual physical exam 11/14/2012  . FECAL INCONTINENCE 03/14/2010  . PERSONAL HX COLONIC POLYPS 03/14/2010  . ARTHRITIS, HANDS, BILATERAL 11/15/2009  . ARTHRITIS, KNEES, BILATERAL 12/07/2008  . HYPOTHYROIDISM 09/28/2008  . 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 OSTEOPENIA 09/28/2008   Starr Lake PT, DPT, LAT, ATC  11/14/16  3:03 PM      [facility] [address] [place], [place], [address] Phone: [phone]   Fax:  [phone]  Name: Erin Carroll MRN: [id number] Date of Birth: 1946/10/15

## 2016-11-20 ENCOUNTER — Ambulatory Visit: Payer: Medicare Other | Admitting: Physical Therapy

## 2016-11-20 DIAGNOSIS — M25561 Pain in right knee: Secondary | ICD-10-CM

## 2016-11-20 DIAGNOSIS — M25551 Pain in right hip: Secondary | ICD-10-CM

## 2016-11-20 DIAGNOSIS — G8929 Other chronic pain: Secondary | ICD-10-CM

## 2016-11-20 DIAGNOSIS — M25661 Stiffness of right knee, not elsewhere classified: Secondary | ICD-10-CM

## 2016-11-20 DIAGNOSIS — R262 Difficulty in walking, not elsewhere classified: Secondary | ICD-10-CM

## 2016-11-20 NOTE — Therapy (Signed)
[place], [place], [address] Phone: [phone]   Fax:  [phone]  Physical Therapy Treatment  Patient Details  Name: Erin Carroll MRN: [id number] Date of Birth: [birth year] Referring Provider: Andree Coss  Encounter Date: 11/20/2016      PT End of Session - 11/20/16 1505    Visit Number 11   Date for PT Re-Evaluation 12/02/16   PT Start Time 1416   PT Stop Time 1504   PT Time Calculation (min) 48 min   Activity Tolerance Patient tolerated treatment well   Behavior During Therapy [facility] for tasks assessed/performed      Past Medical History:  Diagnosis Date  . Alcoholism /alcohol abuse ([place])    sober since 63  . Arthritis    hands, knees.  . Hypothyroidism   . Thyroid disease    problems with thyroid    Past Surgical History:  Procedure Laterality Date  . BREAST BIOPSY  1977   left  . EXCISION/RELEASE BURSA HIP Left 01/08/2014   Procedure: LEFT HIP BURSECTOMY WITH GLUTEAL TENDON REPAIR;  Surgeon: [name], MD;  [facility];  Service: Orthopedics;  Laterality: Left;  With Anchors  . FINGER SURGERY Left 12-31-13   left middle finger cyst -aspirated  . METACARPOPHALANGEAL JOINT ARTHROPLASTY Bilateral    bilateral thumbs- Gramig  . RECTAL PROLAPSE REPAIR  MAY 2015  . right knee arthroscopy  1987/1991  . SHOULDER ARTHROSCOPY  2004  . TONSILLECTOMY    . TOTAL KNEE ARTHROPLASTY Left 01/10/2015   Procedure: LEFT TOTAL KNEE ARTHROPLASTY;  Surgeon: [name], MD;  [facility];  Service: Orthopedics;  Laterality: Left;    There were no vitals filed for this visit.      Subjective Assessment - 11/20/16 1420    Subjective "I am doing well, I had some soreness in the legs but it could be due to doing more than usual"   Currently in Pain? No/denies                    [facility] Adult PT Treatment/Exercise - 11/20/16 0001      Knee/Hip Exercises: Stretches   Piriformis Stretch 3 reps;30 seconds     Knee/Hip Exercises: Aerobic   Nustep Nu-step L5 x 8 min     Knee/Hip Exercises: Seated   Sit to Sand 15 reps;3 sets;without UE support  using mirror for proper form and verbal cues     Knee/Hip Exercises: Supine   Other Supine Knee/Hip Exercises dead bug 4 x 15 sec hold   Other Supine Knee/Hip Exercises alternating clam shells 2 x 15  with red theraband     Knee/Hip Exercises: Sidelying   Other Sidelying Knee/Hip Exercises reverse clams 2 x 15     Manual Therapy   Manual therapy comments manual trigger point release of the piriformis  how to perofrm at home using a tennis ball                      PT Education - 11/20/16 1502    Education provided Yes   Education Details updated HEP for hip strengthening with proper form and rationale, proper sit to stand avoid hip shifting using mirror to assist with proper form.   Person(s) Educated Patient   Methods Explanation;Verbal cues;Handout   Comprehension Verbalized understanding;Verbal cues required          PT Short Term Goals - 10/11/16 1349  PT SHORT TERM GOAL #1   Title independent with HEP   Status Achieved           PT Long Term Goals - 11/01/16 1531      PT LONG TERM GOAL #1   Title independent with RICE   Status Achieved     PT LONG TERM GOAL #2   Title increase strength of the right hip to 4+/6   Status On-going     PT LONG TERM GOAL #3   Title decrease pain 50%   Status On-going     PT LONG TERM GOAL #4   Title no difficulty with sitting or walking   Status Achieved               Plan - 11/20/16 1631    Clinical Impression Statement pt continues to make progress with treatment, opted not to perform DN to assess pt response to treatment focused on strengthening of the hips / core which she performed well but demonstrates R lateral hip shifting with sit to stand requiring verbal / visual cues to reduce.    Rehab Potential Good   PT  Treatment/Interventions Cryotherapy;Electrical Stimulation;Iontophoresis 4mg /ml Dexamethasone;ADLs/Self Care Home Management;Ultrasound;Stair training;Functional mobility training;Therapeutic activities;Therapeutic exercise;Balance training;Neuromuscular re-education;Patient/family education;Manual techniques;Taping   PT Next Visit Plan assess response to DN, hip posterior mobs, and long axis distraction, glute med and piriformis stretching,    PT Home Exercise Plan standing hip abduction/ extension,    Consulted and Agree with Plan of Care Patient      Patient will benefit from skilled therapeutic intervention in order to improve the following deficits and impairments:  Abnormal gait, Decreased range of motion, Decreased strength, Difficulty walking, Impaired flexibility, Pain  Visit Diagnosis: Difficulty in walking, not elsewhere classified  Pain in right hip  Chronic pain of right knee  Knee stiffness, right     Problem List Patient Active Problem List   Diagnosis Date Noted  . Abnormal ECG 05/03/2015  . 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 OA (osteoarthritis) of knee 01/10/2015  . Arthritis of knee, degenerative 01/10/2015  . Bursitis, hip 01/08/2014  . Ecchymosis 12/29/2013  . Annual physical exam 11/14/2012  . FECAL INCONTINENCE 03/14/2010  . PERSONAL HX COLONIC POLYPS 03/14/2010  . ARTHRITIS, HANDS, BILATERAL 11/15/2009  . ARTHRITIS, KNEES, BILATERAL 12/07/2008  . HYPOTHYROIDISM 09/28/2008  . 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 OSTEOPENIA 09/28/2008    Starr Lake 11/20/2016, 4:34 PM  [facility] [address] [place], [place], [address] Phone: [phone]   Fax:  [phone]  Name: Erin Carroll MRN: [id number] Date of Birth: [birth year]

## 2016-12-04 ENCOUNTER — Ambulatory Visit: Payer: Medicare Other | Attending: Orthopedic Surgery | Admitting: Physical Therapy

## 2016-12-04 DIAGNOSIS — R262 Difficulty in walking, not elsewhere classified: Secondary | ICD-10-CM | POA: Diagnosis present

## 2016-12-04 DIAGNOSIS — M25551 Pain in right hip: Secondary | ICD-10-CM

## 2016-12-04 DIAGNOSIS — M7989 Other specified soft tissue disorders: Secondary | ICD-10-CM

## 2016-12-04 DIAGNOSIS — G8929 Other chronic pain: Secondary | ICD-10-CM | POA: Diagnosis present

## 2016-12-04 DIAGNOSIS — M25561 Pain in right knee: Secondary | ICD-10-CM | POA: Insufficient documentation

## 2016-12-04 DIAGNOSIS — M25661 Stiffness of right knee, not elsewhere classified: Secondary | ICD-10-CM | POA: Diagnosis present

## 2016-12-04 NOTE — Therapy (Signed)
[place], [place], [address] Phone: [phone]   Fax:  [phone]  Physical Therapy Treatment / Re-certification  Patient Details  Name: Erin Carroll MRN: [id number] Date of Birth: May 29, 1946 Referring Provider: Andree Coss  Encounter Date: 12/04/2016      PT End of Session - 12/04/16 1530    Visit Number 12   Number of Visits 14   Date for PT Re-Evaluation 01/15/17   PT Start Time 1418   PT Stop Time 1501   PT Time Calculation (min) 43 min   Activity Tolerance Patient tolerated treatment well      Past Medical History:  Diagnosis Date  . Alcoholism /alcohol abuse ([place])    sober since 62  . Arthritis    hands, knees.  . Hypothyroidism   . Thyroid disease    problems with thyroid    Past Surgical History:  Procedure Laterality Date  . BREAST BIOPSY  1977   left  . EXCISION/RELEASE BURSA HIP Left 01/08/2014   Procedure: LEFT HIP BURSECTOMY WITH GLUTEAL TENDON REPAIR;  Surgeon: [name], MD;  [facility];  Service: Orthopedics;  Laterality: Left;  With Anchors  . FINGER SURGERY Left 12-31-13   left middle finger cyst -aspirated  . METACARPOPHALANGEAL JOINT ARTHROPLASTY Bilateral    bilateral thumbs- Gramig  . RECTAL PROLAPSE REPAIR  MAY 2015  . right knee arthroscopy  1987/1991  . SHOULDER ARTHROSCOPY  2004  . TONSILLECTOMY    . TOTAL KNEE ARTHROPLASTY Left 01/10/2015   Procedure: LEFT TOTAL KNEE ARTHROPLASTY;  Surgeon: [name], MD;  [facility];  Service: Orthopedics;  Laterality: Left;    There were no vitals filed for this visit.      Subjective Assessment - 12/04/16 1421    Subjective "I am doing pretty good, I do feel the spot below my knee intermittently driving and playing with the dog"             [facility] PT Assessment - 12/04/16 0001      Strength   Overall Strength Comments right hip 4/5                      [facility] Adult PT  Treatment/Exercise - 12/04/16 0001      Knee/Hip Exercises: Aerobic   Elliptical L 1 x elevation 1 x 5 min     Knee/Hip Exercises: Machines for Strengthening   Cybex Leg Press 1 x10 35#, 1 x 10 45#, 1 x 10 55#     Knee/Hip Exercises: Standing   Other Standing Knee Exercises lateral band walks 2 x 10 bil     Knee/Hip Exercises: Seated   Other Seated Knee/Hip Exercises hip external rotation 1 x 15 with red theraband     Knee/Hip Exercises: Sidelying   Hip ABduction Right;1 set;15 reps   Clams reverse clam shells 1 x 15      Manual Therapy   Soft tissue mobilization DTM over glute medius          Trigger Point Dry Needling - 12/04/16 1434    Consent Given? Yes   Education Handout Provided Yes  given prevously   Muscles Treated Lower Body Gluteus minimus  glute medius   Gluteus Minimus Response Twitch response elicited;Palpable increased muscle length   Piriformis Response Twitch response elicited;Palpable increased muscle length                PT Short Term  Goals - 10/11/16 1349      PT SHORT TERM GOAL #1   Title independent with HEP   Status Achieved           PT Long Term Goals - 12/29/16 1510      PT LONG TERM GOAL #1   Title independent with RICE   Time 8   Period Weeks   Status Achieved     PT LONG TERM GOAL #2   Title increase strength of the right hip to 4+/6   Baseline hips 4/5   Time 8   Period Weeks   Status On-going     PT LONG TERM GOAL #3   Title decrease pain 50%   Time 8   Period Weeks   Status Achieved     PT LONG TERM GOAL #4   Title no difficulty with sitting or walking   Time 8   Period Weeks   Status Achieved               Plan - 2016/12/29 1638    Clinical Impression Statement pt continues to do well with intermittent periods of soreness which tends to be only with sitting and not as severe. DN was perofrmed on the glute medius and piriformis. She was able to do all exercises with no report of increased pain  or soreness. plan to assess pt's progress after 2 week real world test to determin her carry over.    Rehab Potential Good   PT Treatment/Interventions Cryotherapy;Electrical Stimulation;Iontophoresis 4mg /ml Dexamethasone;ADLs/Self Care Home Management;Ultrasound;Stair training;Functional mobility training;Therapeutic activities;Therapeutic exercise;Balance training;Neuromuscular re-education;Patient/family education;Manual techniques;Taping   PT Next Visit Plan assess response to DN, hip posterior mobs, and long axis distraction, glute med and piriformis stretching, d/c if conitnued to do well.    Consulted and Agree with Plan of Care Patient      Patient will benefit from skilled therapeutic intervention in order to improve the following deficits and impairments:  Abnormal gait, Decreased range of motion, Decreased strength, Difficulty walking, Impaired flexibility, Pain  Visit Diagnosis: Difficulty in walking, not elsewhere classified - Plan: PT plan of care cert/re-cert  Pain in right hip - Plan: PT plan of care cert/re-cert  Chronic pain of right knee - Plan: PT plan of care cert/re-cert  Knee stiffness, right - Plan: PT plan of care cert/re-cert  Swelling of limb - Plan: PT plan of care cert/re-cert       G-Codes - 2016/12/29 1633    Functional Assessment Tool Used Clinical judgement   Functional Limitation Mobility: Walking and moving around   Mobility: Walking and Moving Around Current Status [phone]) At least 20 percent but less than 40 percent impaired, limited or restricted   Mobility: Walking and Moving Around Goal Status [phone]) At least 40 percent but less than 60 percent impaired, limited or restricted      Problem List Patient Active Problem List   Diagnosis Date Noted  . Abnormal ECG 05/03/2015  . 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 OA (osteoarthritis) of knee 01/10/2015  . Arthritis of knee, degenerative 01/10/2015  . Bursitis, hip 01/08/2014  .  Ecchymosis 12/29/2013  . Annual physical exam 11/14/2012  . FECAL INCONTINENCE 03/14/2010  . PERSONAL HX COLONIC POLYPS 03/14/2010  . ARTHRITIS, HANDS, BILATERAL 11/15/2009  . ARTHRITIS, KNEES, BILATERAL 12/07/2008  . HYPOTHYROIDISM 09/28/2008  . 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 OSTEOPENIA 09/28/2008   Starr Lake PT, DPT, LAT, ATC  12-29-16  4:42 PM  [place], [place], [address] Phone: [phone]   Fax:  [phone]  Name: LENICE SCHMUHL MRN: [id number] Date of Birth: [birth year]

## 2016-12-18 ENCOUNTER — Ambulatory Visit: Payer: Medicare Other | Admitting: Physical Therapy

## 2016-12-18 DIAGNOSIS — R262 Difficulty in walking, not elsewhere classified: Secondary | ICD-10-CM | POA: Diagnosis not present

## 2016-12-18 DIAGNOSIS — M25551 Pain in right hip: Secondary | ICD-10-CM

## 2016-12-18 DIAGNOSIS — M7989 Other specified soft tissue disorders: Secondary | ICD-10-CM

## 2016-12-18 DIAGNOSIS — G8929 Other chronic pain: Secondary | ICD-10-CM

## 2016-12-18 DIAGNOSIS — M25561 Pain in right knee: Secondary | ICD-10-CM

## 2016-12-18 DIAGNOSIS — M25661 Stiffness of right knee, not elsewhere classified: Secondary | ICD-10-CM

## 2016-12-18 NOTE — Therapy (Signed)
[place], [place], [address] Phone: [phone]   Fax:  [phone]  Physical Therapy Treatment / Discharge Note  Patient Details  Name: Erin Carroll MRN: [id number] Date of Birth: 11-02-1946 Referring Provider: Andree Coss  Encounter Date: 12/18/2016      PT End of Session - 12/18/16 1533    Visit Number 13   Number of Visits 14   Date for PT Re-Evaluation 01/15/17   PT Start Time 1500   PT Stop Time 1530  shortened visit due to discharge   PT Time Calculation (min) 30 min   Activity Tolerance Patient tolerated treatment well   Behavior During Therapy [facility] for tasks assessed/performed      Past Medical History:  Diagnosis Date  . Alcoholism /alcohol abuse ([place])    sober since 23  . Arthritis    hands, knees.  . Hypothyroidism   . Thyroid disease    problems with thyroid    Past Surgical History:  Procedure Laterality Date  . BREAST BIOPSY  1977   left  . EXCISION/RELEASE BURSA HIP Left 01/08/2014   Procedure: LEFT HIP BURSECTOMY WITH GLUTEAL TENDON REPAIR;  Surgeon: [name], MD;  [facility];  Service: Orthopedics;  Laterality: Left;  With Anchors  . FINGER SURGERY Left 12-31-13   left middle finger cyst -aspirated  . METACARPOPHALANGEAL JOINT ARTHROPLASTY Bilateral    bilateral thumbs- Gramig  . RECTAL PROLAPSE REPAIR  MAY 2015  . right knee arthroscopy  1987/1991  . SHOULDER ARTHROSCOPY  2004  . TONSILLECTOMY    . TOTAL KNEE ARTHROPLASTY Left 01/10/2015   Procedure: LEFT TOTAL KNEE ARTHROPLASTY;  Surgeon: [name], MD;  [facility];  Service: Orthopedics;  Laterality: Left;    There were no vitals filed for this visit.      Subjective Assessment - 12/18/16 1500    Subjective "Things are going okay with me, my Husband is at [facility] health." pt reports not having significant pain just noticing weakness.    Currently in Pain? No/denies            [facility] PT  Assessment - 12/18/16 0001      ROM / Strength   AROM / PROM / Strength AROM;Strength     AROM   AROM Assessment Site Lumbar   Lumbar Flexion 88   Lumbar Extension 30   Lumbar - Right Side Bend 24   Lumbar - Left Side Bend 28     Strength   Strength Assessment Site Hip   Right/Left Hip Right   Right Hip Flexion 4+/5   Right Hip Extension 4-/5   Right Hip ABduction 3+/5                     [facility] Adult PT Treatment/Exercise - 12/18/16 0001      Knee/Hip Exercises: Standing   Other Standing Knee Exercises manual trigger point release of  piriformis     Knee/Hip Exercises: Seated   Other Seated Knee/Hip Exercises manual trigger point release using theracane                PT Education - 12/18/16 1532    Education provided Yes   Education Details updated HEP for CKC strengthening with progression. discussed importance of being consistent with exercises. manual trigger point release using theracane and where to find one.    Person(s) Educated Patient   Methods Explanation;Verbal cues   Comprehension Verbalized understanding;Verbal  cues required          PT Short Term Goals - 10/11/16 1349      PT SHORT TERM GOAL #1   Title independent with HEP   Status Achieved           PT Long Term Goals - 2017-01-10 1524      PT LONG TERM GOAL #1   Title independent with RICE   Time 8   Period Weeks   Status Achieved     PT LONG TERM GOAL #2   Title increase strength of the right hip to 4+/6   Time 8   Period Weeks   Status Not Met     PT LONG TERM GOAL #3   Title decrease pain 50%   Time 8   Period Weeks   Status Achieved     PT LONG TERM GOAL #4   Title no difficulty with sitting or walking   Time 8   Period Weeks   Status Achieved               Plan - 01/10/2017 1534    Clinical Impression Statement pt reports being consistent with manual trigger point release techniques and mostly with her HEP. She reports no pain in the hip  just noticeable weakness. updated HEP for standing hip strengthening. She met or partially met all goals except for LTG #2. She is able to maintain and progress her current level of function independelty and will be discharged from PT.    PT Next Visit Plan d/C   PT Home Exercise Plan standing hip abduction/ extension with progression, sit to stand   Consulted and Agree with Plan of Care Patient      Patient will benefit from skilled therapeutic intervention in order to improve the following deficits and impairments:  Abnormal gait, Decreased range of motion, Decreased strength, Difficulty walking, Impaired flexibility, Pain  Visit Diagnosis: Difficulty in walking, not elsewhere classified  Pain in right hip  Chronic pain of right knee  Knee stiffness, right  Swelling of limb       G-Codes - January 10, 2017 1536    Functional Assessment Tool Used Clinical judgement / FOTO    Functional Limitation Mobility: Walking and moving around   Mobility: Walking and Moving Around Goal Status [phone]) At least 40 percent but less than 60 percent impaired, limited or restricted   Mobility: Walking and Moving Around Discharge Status [phone]) At least 20 percent but less than 40 percent impaired, limited or restricted      Problem List Patient Active Problem List   Diagnosis Date Noted  . Abnormal ECG 05/03/2015  . 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 OA (osteoarthritis) of knee 01/10/2015  . Arthritis of knee, degenerative 01/10/2015  . Bursitis, hip 01/08/2014  . Ecchymosis 12/29/2013  . Annual physical exam 11/14/2012  . FECAL INCONTINENCE 03/14/2010  . PERSONAL HX COLONIC POLYPS 03/14/2010  . ARTHRITIS, HANDS, BILATERAL 11/15/2009  . ARTHRITIS, KNEES, BILATERAL 12/07/2008  . HYPOTHYROIDISM 09/28/2008  . 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 OSTEOPENIA 09/28/2008   Starr Lake PT, DPT, LAT, ATC  10-Jan-2017  3:40 PM      [facility] [address] [place], [place], [address] Phone: [phone]   Fax:  [phone]  Name: SHIRYL RUDDY MRN: [id number] Date of Birth: January 06, 1946     PHYSICAL THERAPY DISCHARGE SUMMARY  Visits from Start of Care: 13  Current functional level related to goals /  functional outcomes: See goals   Remaining deficits: Weakness in the R hip.    Education / Equipment: HEP, anatomy of condition, manual trigger point release techniques, theraband,   Plan: Patient agrees to discharge.  Patient goals were partially met. Patient is being discharged due to being pleased with the current functional level.  ?????

## 2017-03-20 ENCOUNTER — Ambulatory Visit: Payer: Medicare Other | Attending: Gastroenterology | Admitting: Physical Therapy

## 2017-03-20 ENCOUNTER — Encounter: Payer: Self-pay | Admitting: Physical Therapy

## 2017-03-20 DIAGNOSIS — M6281 Muscle weakness (generalized): Secondary | ICD-10-CM | POA: Diagnosis present

## 2017-03-20 DIAGNOSIS — R278 Other lack of coordination: Secondary | ICD-10-CM | POA: Insufficient documentation

## 2017-03-20 NOTE — Patient Instructions (Addendum)
Quick Contraction: Gravity Eliminated (Side-Lying)    Lie on left side, hips and knees slightly bent. Quickly squeeze then fully relax pelvic floor. Perform _1__ sets of _5__. Rest for _1__ seconds between sets. Do _3__ times a day.   Copyright  VHI. All rights reserved.   Slow Contraction: Gravity Eliminated (Side-Lying)    Lie on left side, hips and knees slightly bent. Slowly squeeze pelvic floor for _5__ seconds. Rest for _5__ seconds. Repeat _10__ times. Do __3_ times a day.   Copyright  VHI. All rights reserved.   Quick Contraction: Gravity Resisted (Sitting)    Sitting, quickly squeeze then fully relax pelvic floor. Perform __1_ sets of _10__. Rest for _1__ seconds between sets. Do _2__ times a day.  Copyright  VHI. All rights reserved.  About Abdominal Massage  Abdominal massage, also called external colon massage, is a self-treatment circular massage technique that can reduce and eliminate gas and ease constipation. The colon naturally contracts in waves in a clockwise direction starting from inside the right hip, moving up toward the ribs, across the belly, and down inside the left hip.  When you perform circular abdominal massage, you help stimulate your colon's normal wave pattern of movement called peristalsis.  It is most beneficial when done after eating.  Positioning You can practice abdominal massage with oil while lying down, or in the shower with soap.  Some people find that it is just as effective to do the massage through clothing while sitting or standing.  How to Massage Start by placing your finger tips or knuckles on your right side, just inside your hip bone.  . Make small circular movements while you move upward toward your rib cage.   . Once you reach the bottom right side of your rib cage, take your circular movements across to the left side of the bottom of your rib cage.  . Next, move downward until you reach the inside of your left hip bone.   This is the path your feces travel in your colon. . Continue to perform your abdominal massage in this pattern for 10 minutes each day.     You can apply as much pressure as is comfortable in your massage.  Start gently and build pressure as you continue to practice.  Notice any areas of pain as you massage; areas of slight pain may be relieved as you massage, but if you have areas of significant or intense pain, consult with your healthcare provider.  Other Considerations . General physical activity including bending and stretching can have a beneficial massage-like effect on the colon.  Deep breathing can also stimulate the colon because breathing deeply activates the same nervous system that supplies the colon.   . Abdominal massage should always be used in combination with a bowel-conscious diet that is high in the proper type of fiber for you, fluids (primarily water), and a regular exercise program. [facility] [address], [place], [place] [address] Phone # [phone] Fax [phone]

## 2017-03-20 NOTE — Therapy (Signed)
[facility] [address], [place], [place], [address] Phone: [phone]   Fax:  [phone]  Physical Therapy Evaluation  Patient Details  Name: Erin Carroll MRN: [id number] Date of Birth: 1946/07/20 Referring Provider: Dr. Marcelyn Ditty  Encounter Date: 03/20/2017      PT End of Session - 03/20/17 1250    Visit Number 1   Number of Visits 10   Date for PT Re-Evaluation 05/15/17   Authorization Type medicare 10th visit g-code; kx modifier on 15th visit   PT Start Time 1145   PT Stop Time 1253   PT Time Calculation (min) 68 min   Activity Tolerance Patient tolerated treatment well   Behavior During Therapy [facility] for tasks assessed/performed      Past Medical History:  Diagnosis Date  . Alcoholism /alcohol abuse ([place])    sober since 76  . Arthritis    hands, knees.  . Hypothyroidism   . Thyroid disease    problems with thyroid    Past Surgical History:  Procedure Laterality Date  . BREAST BIOPSY  1977   left  . EXCISION/RELEASE BURSA HIP Left 01/08/2014   Procedure: LEFT HIP BURSECTOMY WITH GLUTEAL TENDON REPAIR;  Surgeon: [name], MD;  Location: [facility];  Service: Orthopedics;  Laterality: Left;  With Anchors  . FINGER SURGERY Left 12-31-13   left middle finger cyst -aspirated  . METACARPOPHALANGEAL JOINT ARTHROPLASTY Bilateral    bilateral thumbs- Gramig  . RECTAL PROLAPSE REPAIR  MAY 2015  . right knee arthroscopy  1987/1991  . SHOULDER ARTHROSCOPY  2004  . TONSILLECTOMY    . TOTAL KNEE ARTHROPLASTY Left 01/10/2015   Procedure: LEFT TOTAL KNEE ARTHROPLASTY;  Surgeon: [name], MD;  Location: [facility];  Service: Orthopedics;  Laterality: Left;    There were no vitals filed for this visit.       Subjective Assessment - 03/20/17 1154    Subjective Unpredictable fecal incontinence. Most days has a bowel movement and some days there is multiple bowels in the commode. Some days there  is a small fecal material in her underwear that she is not aware of.  Sometimes she will wipe and have fecal material on the tissue without having a bowel movement.  No urinary leakage.    Patient Stated Goals reduce fecal leakage and wear new underwear   Currently in Pain? No/denies            [facility] PT Assessment - 03/20/17 0001      Assessment   Medical Diagnosis R15.9 incontinence of feces , unspecified  fecal incontinence type; M62.89 Pelvic floor dysfunction   Referring Provider Dr. Marcelyn Ditty   Onset Date/Surgical Date 02/01/16   Prior Therapy None     Precautions   Precautions None     Restrictions   Weight Bearing Restrictions No     Balance Screen   Has the patient fallen in the past 6 months No   Has the patient had a decrease in activity level because of a fear of falling?  No   Is the patient reluctant to leave their home because of a fear of falling?  No     Home Ecologist residence     Prior Function   Level of Independence Independent   Vocation Part time employment   Vocation Requirements standing   Leisure golfing, stretch, walking, exercise years     Cognition   Overall Cognitive  Status Within Functional Limits for tasks assessed     Observation/Other Assessments   Focus on Therapeutic Outcomes (FOTO)  48% bowel leakage  38% limitation bowel leakage     Posture/Postural Control   Posture/Postural Control No significant limitations     ROM / Strength   AROM / PROM / Strength Strength;AROM     AROM   Overall AROM Comments lumbar ROM is full except for extension decreased by 25%     Strength   Right Hip Flexion 4/5   Right Hip External Rotation  4/5   Right Hip Internal Rotation 4/5   Right Hip ABduction 3/5   Left Hip Extension 3+/5   Left Hip ABduction 3+/5     Palpation   SI assessment  right ilium is posteriorly rotated   Palpation comment around right greater trochanter                  Pelvic Floor Special Questions - 03/20/17 0001    Prior Pregnancies No   Currently Sexually Active No   Urinary Leakage No   Fecal incontinence Yes   Caffeine beverages 5 cups of coffe per morning   Skin Integrity Intact   Pelvic Floor Internal Exam Patient confirmed identification and approves PT to assess muscle intergrity   Exam Type Rectal   Palpation thickness on the posterior anal canal; puborectalis does not move forward; difficulty with contracting the internal and external sphinter   Strength fair squeeze, definite lift   Strength # of reps 5   Strength # of seconds 5   Tone low tone                  PT Education - 03/20/17 1250    Education provided Yes   Education Details pelvic floor contraction; abdominal massage   Person(s) Educated Patient   Methods Explanation;Demonstration;Verbal cues;Handout   Comprehension Returned demonstration;Verbalized understanding          PT Short Term Goals - 03/20/17 1306      PT SHORT TERM GOAL #1   Title independent with initial HEP   Time 4   Period Weeks   Status New     PT SHORT TERM GOAL #2   Title ability to contract the internal and external sphincter and puborectalis together for quality anal contraction   Time 4   Period Weeks   Status New     PT SHORT TERM GOAL #3   Title understand how to perform abdominal massage to improve bowel movements   Time 4   Period Weeks   Status New     PT SHORT TERM GOAL #4   Title understand how to incorporate enough water into her diet and fiber   Time 4   Period Weeks   Status New     PT SHORT TERM GOAL #5   Title fecal leakage after going to the bathroom improved >/= 25%   Time 4   Period Weeks   Status New           PT Long Term Goals - 03/20/17 1308      PT LONG TERM GOAL #1   Title independent with  HEP and understand how to progress herself   Time 8   Period Weeks   Status New     PT LONG TERM GOAL #2   Title increase strength of the anal  canal>/=4/5   Time 8   Period Weeks   Status New  PT LONG TERM GOAL #3   Title fecal leakage improved >/= 75% due to increased strength and tone of rectum   Time 8   Period Weeks   Status New     PT LONG TERM GOAL #4   Title bilateral hip strength >/= 4/5 so patient is able to increase pelvic floor strength   Time 8   Period Weeks   Status New     PT LONG TERM GOAL #5   Title FOTO score </= 38% limitation   Time 8   Period Weeks   Status New               Plan - 03/20/17 1258    Clinical Impression Statement Patient is a 71 year old female with diagnosis of fecal incontinence for the past year with sudden onset. Patient reports no pain. Patient reports she has stool leakage after she wipes from going the the bathroom.  Patient reports she will have stool in her underwear when she does not realize it. Patient will somes times have several bowel movements per day.  Pelvic floor strength is 3/5 with difficulty contracting the internal and external sphincter together and the puborectalis does not come forward with contraction. Thickness is felt in the posterior anal canal by the puborectalis muscles.  Patient has weakness on bilateral hips with right worse than left.  Patient has palpable tenderness around the right greater trochanter.  Patient is low complexity evaluation due to an evolving condition and comorbidities such as rectal prolapse  repair 04/2014. Patient will benefit from skilled therapy to improve strength of the hips and rectal muscles to reduce fecal incontinence.    Rehab Potential Excellent   Clinical Impairments Affecting Rehab Potential Rectal prolpase repair 04/2014   PT Frequency 1x / week   PT Duration 8 weeks   PT Treatment/Interventions Biofeedback;Therapeutic activities;Therapeutic exercise;Neuromuscular re-education;Patient/family education;Manual techniques   PT Next Visit Plan go over diet; pelvic floor EMG   PT Home Exercise Plan diet   Recommended  Other Services None   Consulted and Agree with Plan of Care Patient      Patient will benefit from skilled therapeutic intervention in order to improve the following deficits and impairments:  Decreased strength, Decreased activity tolerance, Decreased coordination  Visit Diagnosis: Muscle weakness (generalized) - Plan: PT plan of care cert/re-cert  Other lack of coordination - Plan: PT plan of care cert/re-cert      G-Codes - 83/41/96 1311    Functional Assessment Tool Used (Outpatient Only) FOTO score is 48% limitation for bowel leakage  goal is 38% limitation   Functional Limitation Other PT primary   Other PT Primary Current Status (Q2297) At least 40 percent but less than 60 percent impaired, limited or restricted   Other PT Primary Goal Status (L8921) At least 20 percent but less than 40 percent impaired, limited or restricted       Problem List Patient Active Problem List   Diagnosis Date Noted  . Abnormal ECG 05/03/2015  . 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 OA (osteoarthritis) of knee 01/10/2015  . Arthritis of knee, degenerative 01/10/2015  . Bursitis, hip 01/08/2014  . Ecchymosis 12/29/2013  . Annual physical exam 11/14/2012  . FECAL INCONTINENCE 03/14/2010  . PERSONAL HX COLONIC POLYPS 03/14/2010  . ARTHRITIS, HANDS, BILATERAL 11/15/2009  . ARTHRITIS, KNEES, BILATERAL 12/07/2008  . HYPOTHYROIDISM 09/28/2008  . 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 OSTEOPENIA 09/28/2008    Earlie Counts, PT 03/20/17 1:14 PM  [facility] [address], [place], [place], [address] Phone: [phone]   Fax:  [phone]  Name: Erin Carroll MRN: [id number] Date of Birth: June 18, 1946

## 2017-03-28 ENCOUNTER — Ambulatory Visit: Payer: Medicare Other | Admitting: Physical Therapy

## 2017-03-28 ENCOUNTER — Encounter: Payer: Self-pay | Admitting: Physical Therapy

## 2017-03-28 DIAGNOSIS — R278 Other lack of coordination: Secondary | ICD-10-CM

## 2017-03-28 DIAGNOSIS — M6281 Muscle weakness (generalized): Secondary | ICD-10-CM | POA: Diagnosis not present

## 2017-03-28 NOTE — Therapy (Signed)
[facility] [address], [place], [place], [address] Phone: [phone]   Fax:  [phone]  Physical Therapy Treatment  Patient Details  Name: Erin Carroll MRN: [id number] Date of Birth: 1946/01/23 Referring Provider: Dr. Marcelyn Ditty  Encounter Date: 03/28/2017      PT End of Session - 03/28/17 1607    Visit Number 2   Number of Visits 10   Date for PT Re-Evaluation 05/15/17   Authorization Type medicare 10th visit g-code; kx modifier on 15th visit   PT Start Time 1530   PT Stop Time 1609   PT Time Calculation (min) 39 min   Activity Tolerance Patient tolerated treatment well   Behavior During Therapy [facility] for tasks assessed/performed      Past Medical History:  Diagnosis Date  . Alcoholism /alcohol abuse ([place])    sober since 45  . Arthritis    hands, knees.  . Hypothyroidism   . Thyroid disease    problems with thyroid    Past Surgical History:  Procedure Laterality Date  . BREAST BIOPSY  1977   left  . EXCISION/RELEASE BURSA HIP Left 01/08/2014   Procedure: LEFT HIP BURSECTOMY WITH GLUTEAL TENDON REPAIR;  Surgeon: [name], MD;  Location: [facility];  Service: Orthopedics;  Laterality: Left;  With Anchors  . FINGER SURGERY Left 12-31-13   left middle finger cyst -aspirated  . METACARPOPHALANGEAL JOINT ARTHROPLASTY Bilateral    bilateral thumbs- Gramig  . RECTAL PROLAPSE REPAIR  MAY 2015  . right knee arthroscopy  1987/1991  . SHOULDER ARTHROSCOPY  2004  . TONSILLECTOMY    . TOTAL KNEE ARTHROPLASTY Left 01/10/2015   Procedure: LEFT TOTAL KNEE ARTHROPLASTY;  Surgeon: [name], MD;  Location: [facility];  Service: Orthopedics;  Laterality: Left;    There were no vitals filed for this visit.      Subjective Assessment - 03/28/17 1535    Subjective I have difficulty with isolating the anal sphinter.    Patient Stated Goals reduce fecal leakage and wear new underwear   Currently in  Pain? No/denies                      Pelvic Floor Special Questions - 03/28/17 0001    Biofeedback isolating anal sphincter contraction with breathing and not using the gluteals in sitting; contract anal sphincter muscle 50% for 5 sec 10 without contracting the gluteals ; hip abduction with pelvic floor exercise in sidelying.   Biofeedback sensor type Rectal  surface                   PT Education - 03/28/17 1607    Education provided Yes   Education Details pelvic floor contracition in sitting and hip abduction in sidely   Person(s) Educated Patient   Methods Explanation;Demonstration;Verbal cues;Handout   Comprehension Returned demonstration;Verbalized understanding          PT Short Term Goals - 03/28/17 1614      PT SHORT TERM GOAL #1   Title independent with initial HEP   Time 4   Period Weeks   Status On-going     PT SHORT TERM GOAL #2   Title ability to contract the internal and external sphincter and puborectalis together for quality anal contraction   Time 4   Period Weeks   Status On-going     PT SHORT TERM GOAL #3   Title understand how to perform abdominal massage  to improve bowel movements   Time 4   Period Weeks   Status Achieved     PT SHORT TERM GOAL #4   Title understand how to incorporate enough water into her diet and fiber   Time 4   Period Weeks   Status Achieved     PT SHORT TERM GOAL #5   Title fecal leakage after going to the bathroom improved >/= 25%   Time 4   Period Weeks   Status On-going           PT Long Term Goals - 03/20/17 1308      PT LONG TERM GOAL #1   Title independent with  HEP and understand how to progress herself   Time 8   Period Weeks   Status New     PT LONG TERM GOAL #2   Title increase strength of the anal canal>/=4/5   Time 8   Period Weeks   Status New     PT LONG TERM GOAL #3   Title fecal leakage improved >/= 75% due to increased strength and tone of rectum   Time 8    Period Weeks   Status New     PT LONG TERM GOAL #4   Title bilateral hip strength >/= 4/5 so patient is able to increase pelvic floor strength   Time 8   Period Weeks   Status New     PT LONG TERM GOAL #5   Title FOTO score </= 38% limitation   Time 8   Period Weeks   Status New               Plan - 03/28/17 1607    Clinical Impression Statement Patient resting tone in pelvic floor is 1.5uv. Patient does not have the endurance to contract at peak for 5 seconds.  Patient has difficulty with isolating the pelvic floor muscles but after therapy was able to .  Patient is able to perform a consistent contraction with 505 of strength instead of full strength. Patient will benefit from skilled therapy to improve strength of the hips and rectal muscles to reduce fecal incontinence.    Rehab Potential Excellent   Clinical Impairments Affecting Rehab Potential Rectal prolpase repair 04/2014   PT Frequency 1x / week   PT Duration 8 weeks   PT Treatment/Interventions Biofeedback;Therapeutic activities;Therapeutic exercise;Neuromuscular re-education;Patient/family education;Manual techniques   PT Next Visit Plan pelvic floor EMG in standing and transitional movements   PT Home Exercise Plan progress as needed   Consulted and Agree with Plan of Care Patient      Patient will benefit from skilled therapeutic intervention in order to improve the following deficits and impairments:  Decreased strength, Decreased activity tolerance, Decreased coordination  Visit Diagnosis: Muscle weakness (generalized)  Other lack of coordination     Problem List Patient Active Problem List   Diagnosis Date Noted  . Abnormal ECG 05/03/2015  . 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 OA (osteoarthritis) of knee 01/10/2015  . Arthritis of knee, degenerative 01/10/2015  . Bursitis, hip 01/08/2014  . Ecchymosis 12/29/2013  . Annual physical exam 11/14/2012  . FECAL INCONTINENCE 03/14/2010   . PERSONAL HX COLONIC POLYPS 03/14/2010  . ARTHRITIS, HANDS, BILATERAL 11/15/2009  . ARTHRITIS, KNEES, BILATERAL 12/07/2008  . HYPOTHYROIDISM 09/28/2008  . 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 OSTEOPENIA 09/28/2008    Earlie Counts, PT 03/28/17 4:16 PM   [facility] [address],  [place], [place], [address] Phone: [phone]   Fax:  [phone]  Name: BREEA LONCAR MRN: [id number] Date of Birth: [birth year]

## 2017-03-28 NOTE — Patient Instructions (Addendum)
Bowel Control and Holding On When bowel control is decreased or lost it is helpful to understand some control techniques. Here are some tips for controlling your bowel movements.  1. Choose the best time of day to have a bowel movement:  Usually the best time of day for a bowel movement will be a half hour to an hour after breakfast.  For some people, a half hour to an hour after lunch will work better.  These times are best because the body uses the gastrocolic reflex, a stimulation of bowel motion that occurs with eating, to help produce a bowel movement.  2. Make sure that you are not rushed and have convenient access to a bathroom at your selected time.  3. Eat all your meals at a predictable time each day.  The bowel functions best when food is introduced at the same regular intervals.  4. The amount of food eaten at a given time of day should be about the same size from day to day.  The bowel functions best when food is introduced in similar quantities from day to day.  It is fine to have a small breakfast and a large lunch, or vice versa, just be consistent.  5. Eat two servings of fruit or vegetables and at least one serving of complex carbohydrates (whole grains such as brown rice, bran, whole wheat bread, or oatmeal) at each meal.   A serving of fruit or vegetables is a half-cup or medium-sized piece of fruit.  A serving of a complex carbohydrate is a half-cup or a slice of bread.  It is often desirable to eat more than the recommended minimum amounts of fruits, vegetables, and complex carbohydrates.  6. Drink plenty of water-ideally eight glasses a day.  7. Until regular bowel movements are established at a desired time of day, take 2-3 dried prunes (or  to 1/3 cup of prune juice) each night to stimulate morning bowel function.  8. Exercise daily.  You may exercise at any time of day, but you may find that bowel function is helped most if the exercise is at a consistent time each  day.      Quick Contraction: Gravity Resisted (Sitting)    Sitting, quickly squeeze then fully relax pelvic floor. Perform _1__ sets of _10__. Rest for __1_ seconds between sets. Do _3__ times a day.  Copyright  VHI. All rights reserved.  Slow Contraction: Gravity Resisted (Sitting)    Sitting, slowly squeeze pelvic floor 50% without tightening the gluteals for _5__ seconds. Rest for _5__ seconds. Repeat 10___ times. Do _3__ times a day.  Copyright  VHI. All rights reserved.  Strengthening: Hip Abduction (Side-Lying)    Tighten muscles on front of left thigh and pelvic floor then lift leg __6__ inches from surface, keeping knee locked.  Repeat __10__ times per set. Do __1_-2_ sets per session. Do __1__ sessions per day. Both legs http://orth.exer.us/622   Copyright  VHI. All rights reserved.   [facility] [address], [place], [place] [address] Phone # [phone] Fax [phone]

## 2017-04-04 ENCOUNTER — Encounter: Payer: Medicare Other | Admitting: Physical Therapy

## 2017-04-11 ENCOUNTER — Encounter: Payer: Medicare Other | Admitting: Physical Therapy

## 2017-04-18 ENCOUNTER — Ambulatory Visit: Payer: Medicare Other | Attending: Gastroenterology | Admitting: Physical Therapy

## 2017-04-18 ENCOUNTER — Encounter: Payer: Self-pay | Admitting: Physical Therapy

## 2017-04-18 DIAGNOSIS — M6281 Muscle weakness (generalized): Secondary | ICD-10-CM

## 2017-04-18 DIAGNOSIS — R278 Other lack of coordination: Secondary | ICD-10-CM | POA: Diagnosis present

## 2017-04-18 NOTE — Patient Instructions (Addendum)
Bracing With Leg March (Hook-Lying)    With neutral spine, tighten pelvic floor and abdominals and hold. Alternating legs, lift foot _12__ inches and return to floor. Repeat _15__ times alternating legs.  Do __1_ times a day.   Copyright  VHI. All rights reserved.    Bracing With Heel Slides (Supine)    With neutral spine, tighten pelvic floor and abdominals and hold. Alternating legs, slide heel to bottom. Repeat _15__ times each side.  Do _1__ times a day.   Copyright  VHI. All rights reserved.  Bracing With March in Bridging (Hook-Lying)    With neutral spine, tighten pelvic floor and abdominals and hold. Lift bottom and hold, then march in place. March _10__ times each leg. Do _1__ times a day.   Copyright  VHI. All rights reserved.  Strengthening: Hip Adduction (Side-Lying)    Tighten muscles on front of right thigh, then lift leg __2__ inches from surface, keeping knee locked.  Repeat _15___ times per set. Do _1___ sets per session. Do __1__ sessions per day. Contract pelvic floor.  http://orth.exer.us/624   Copyright  VHI. All rights reserved.  Bracing With Arm / Leg Raise (Quadruped)    On hands and knees find neutral spine. Tighten pelvic floor and abdominals and hold. Alternating, lift arm to shoulder level and opposite leg to hip level. Repeat __10_ times. Do _1__ times a day.   Copyright  VHI. All rights reserved.  [facility] [address] [place], [place] [address] Phone # [phone] Fax [phone]

## 2017-04-18 NOTE — Therapy (Addendum)
[facility] [address] [place], [place], [address] Phone: [phone]   Fax:  [phone]  Physical Therapy Treatment  Patient Details  Name: Erin Carroll MRN: [id number] Date of Birth: [birth year] Referring Provider: Dr. Marcelyn Ditty  Encounter Date: 04/18/2017    Past Medical History:  Diagnosis Date  . Alcoholism /alcohol abuse ([place])    sober since 71  . Arthritis    hands, knees.  . Hypothyroidism   . Thyroid disease    problems with thyroid    Past Surgical History:  Procedure Laterality Date  . BREAST BIOPSY  1977   left  . EXCISION/RELEASE BURSA HIP Left 01/08/2014   Procedure: LEFT HIP BURSECTOMY WITH GLUTEAL TENDON REPAIR;  Surgeon: [name], MD;  Location: [facility];  Service: Orthopedics;  Laterality: Left;  With Anchors  . FINGER SURGERY Left 12-31-13   left middle finger cyst -aspirated  . METACARPOPHALANGEAL JOINT ARTHROPLASTY Bilateral    bilateral thumbs- [place]  . RECTAL PROLAPSE REPAIR  MAY 2015  . right knee arthroscopy  1987/1991  . SHOULDER ARTHROSCOPY  2004  . TONSILLECTOMY    . TOTAL KNEE ARTHROPLASTY Left 01/10/2015   Procedure: LEFT TOTAL KNEE ARTHROPLASTY;  Surgeon: [name], MD;  Location: [facility];  Service: Orthopedics;  Laterality: Left;    There were no vitals filed for this visit.    g-code: Functional assessment tool used: FOTO score is 48% limitation for bowel leakage; Functional limitation is other PT primary: goal status is CJ; Discharge status is CK.  Earlie Counts, PT 06/26/17 9:27 AM '                             PT Short Term Goals - 04/18/17 1538      PT SHORT TERM GOAL #1   Title independent with initial HEP   Time 4   Period Weeks   Status Achieved     PT SHORT TERM GOAL #2   Title ability to contract the internal and external sphincter and puborectalis together for quality anal contraction   Time 4   Period  Weeks   Status Achieved     PT SHORT TERM GOAL #3   Title understand how to perform abdominal massage to improve bowel movements   Time 4   Period Weeks   Status Achieved     PT SHORT TERM GOAL #4   Title understand how to incorporate enough water into her diet and fiber   Time 4   Period Weeks   Status Achieved     PT SHORT TERM GOAL #5   Title fecal leakage after going to the bathroom improved >/= 25%   Time 4   Period Weeks   Status Achieved           PT Long Term Goals - 04/18/17 1539      PT LONG TERM GOAL #1   Title independent with  HEP and understand how to progress herself   Time 8   Period Weeks   Status On-going     PT LONG TERM GOAL #2   Title increase strength of the anal canal>/=4/5   Time 8   Period Weeks   Status On-going     PT LONG TERM GOAL #3   Title fecal leakage improved >/= 75% due to increased strength and tone of rectum   Time 8   Period Weeks  Status On-going     PT LONG TERM GOAL #4   Title bilateral hip strength >/= 4/5 so patient is able to increase pelvic floor strength   Time 8   Period Weeks   Status On-going     PT LONG TERM GOAL #5   Title FOTO score </= 38% limitation   Time 8   Status On-going             Patient will benefit from skilled therapeutic intervention in order to improve the following deficits and impairments:  Decreased strength, Decreased activity tolerance, Decreased coordination  Visit Diagnosis: Muscle weakness (generalized)  Other lack of coordination     Problem List Patient Active Problem List   Diagnosis Date Noted  . Abnormal ECG 05/03/2015  . 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 OA (osteoarthritis) of knee 01/10/2015  . Arthritis of knee, degenerative 01/10/2015  . Bursitis, hip 01/08/2014  . Ecchymosis 12/29/2013  . Annual physical exam 11/14/2012  . FECAL INCONTINENCE 03/14/2010  . PERSONAL HX COLONIC POLYPS 03/14/2010  . ARTHRITIS, HANDS, BILATERAL  11/15/2009  . ARTHRITIS, KNEES, BILATERAL 12/07/2008  . HYPOTHYROIDISM 09/28/2008  . 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 OSTEOPENIA 09/28/2008    Earlie Counts, PT 06/26/17 9:27 AM   [facility] [address], [place], [place], [address] Phone: [phone]   Fax:  [phone]  Name: Erin Carroll MRN: [id number] Date of Birth: 24-Apr-1946  PHYSICAL THERAPY DISCHARGE SUMMARY  Visits from Start of Care: 3  Current functional level related to goals / functional outcomes: See above.    Remaining deficits: See above. Unable to assess patient due to not returning to PT.    Education / Equipment: HEP Plan: Patient agrees to discharge.  Patient goals were not met. Patient is being discharged due to meeting the stated rehab goals.  ?????    Earlie Counts, PT 06/26/17 9:27 AM

## 2017-05-02 ENCOUNTER — Ambulatory Visit: Payer: Medicare Other | Admitting: Physical Therapy

## 2017-10-14 ENCOUNTER — Other Ambulatory Visit (HOSPITAL_COMMUNITY): Payer: Self-pay | Admitting: Orthopedic Surgery

## 2017-10-14 DIAGNOSIS — Z96652 Presence of left artificial knee joint: Secondary | ICD-10-CM

## 2017-10-14 DIAGNOSIS — M25562 Pain in left knee: Secondary | ICD-10-CM

## 2017-10-25 ENCOUNTER — Ambulatory Visit (HOSPITAL_COMMUNITY)
Admission: RE | Admit: 2017-10-25 | Discharge: 2017-10-25 | Disposition: A | Discharge status: Home / Self Care | Payer: Medicare Other | Source: Ambulatory Visit | Attending: Orthopedic Surgery | Admitting: Orthopedic Surgery

## 2017-10-25 ENCOUNTER — Encounter (HOSPITAL_COMMUNITY)
Admission: RE | Admit: 2017-10-25 | Discharge: 2017-10-25 | Disposition: A | Discharge status: Home / Self Care | Payer: Medicare Other | Source: Ambulatory Visit | Attending: Orthopedic Surgery | Admitting: Orthopedic Surgery

## 2017-10-25 DIAGNOSIS — M25562 Pain in left knee: Secondary | ICD-10-CM | POA: Insufficient documentation

## 2017-10-25 DIAGNOSIS — Z96652 Presence of left artificial knee joint: Secondary | ICD-10-CM | POA: Insufficient documentation

## 2017-10-25 MED ORDER — TECHNETIUM TC 99M MEDRONATE IV KIT
25.0000 | PACK | Freq: Once | INTRAVENOUS | Status: AC | PRN
  Administered 2017-10-25: 21 via INTRAVENOUS

## 2017-12-16 ENCOUNTER — Other Ambulatory Visit: Payer: Self-pay | Admitting: Orthopedic Surgery

## 2018-01-15 NOTE — Pre-Procedure Instructions (Signed)
Erin Carroll  01/15/2018      [id number] - [place] [address] Phone: [phone] Fax: [phone]    Your procedure is scheduled on January 27, 2018.  Report to [facility] Admitting at 530 AM.  Call this number if you have problems the morning of surgery:  [phone]   Remember:  Do not eat food or drink liquids after midnight.  Take these medicines the morning of surgery with A SIP OF WATER chlorpheniramine (Chlor-trimeton)-if needed, Sudafed-if needed, levothyroxine (synthroid), doxycycline (vibramycin).  7 days prior to surgery STOP taking any excedrin migraine, celecoxib (celebrex), Aspirin (unless otherwise instructed by your surgeon), Aleve, Naproxen, Ibuprofen, Motrin, Advil, Goody's, BC's, all herbal medications, fish oil, and all vitamins  Continue all other medications as instructed by your physician except follow the above medication instructions before surgery   Do not wear jewelry, make-up or nail polish.  Do not wear lotions, powders, or perfumes, or deodorant.  Men may shave face and neck.  Do not bring valuables to the hospital.  [facility] is not responsible for any belongings or valuables.  Contacts, dentures or bridgework may not be worn into surgery.  Leave your suitcase in the car.  After surgery it may be brought to your room.  For patients admitted to the hospital, discharge time will be determined by your treatment team.  Patients discharged the day of surgery will not be allowed to drive home.   Special instructions:  [place]- Preparing For Surgery  Before surgery, you can play an important role. Because skin is not sterile, your skin needs to be as free of germs as possible. You can reduce the number of germs on your skin by washing with CHG (chlorahexidine gluconate) Soap before surgery.  CHG is an antiseptic cleaner which kills germs and bonds with the  skin to continue killing germs even after washing.  Please do not use if you have an allergy to CHG or antibacterial soaps. If your skin becomes reddened/irritated stop using the CHG.  Do not shave (including legs and underarms) for at least 48 hours prior to first CHG shower. It is OK to shave your face.  Please follow these instructions carefully.   1. Shower the NIGHT BEFORE SURGERY and the MORNING OF SURGERY with CHG.   2. If you chose to wash your hair, wash your hair first as usual with your normal shampoo.  3. After you shampoo, rinse your hair and body thoroughly to remove the shampoo.  4. Use CHG as you would any other liquid soap. You can apply CHG directly to the skin and wash gently with a scrungie or a clean washcloth.   5. Apply the CHG Soap to your body ONLY FROM THE NECK DOWN.  Do not use on open wounds or open sores. Avoid contact with your eyes, ears, mouth and genitals (private parts). Wash Face and genitals (private parts)  with your normal soap.  6. Wash thoroughly, paying special attention to the area where your surgery will be performed.  7. Thoroughly rinse your body with warm water from the neck down.  8. DO NOT shower/wash with your normal soap after using and rinsing off the CHG Soap.  9. Pat yourself dry with a CLEAN TOWEL.  10. Wear CLEAN PAJAMAS to bed the night before surgery, wear comfortable clothes the morning of surgery  11. Place CLEAN SHEETS on your bed the night of  your first shower and DO NOT SLEEP WITH PETS.  Day of Surgery: Do not apply any deodorants/lotions. Please wear clean clothes to the hospital/surgery center.    Please read over the following fact sheets that you were given. Pain Booklet, Coughing and Deep Breathing, MRSA Information and Surgical Site Infection Prevention

## 2018-01-16 ENCOUNTER — Encounter (HOSPITAL_COMMUNITY)
Admission: RE | Admit: 2018-01-16 | Discharge: 2018-01-16 | Disposition: A | Discharge status: Home / Self Care | Payer: Medicare Other | Source: Ambulatory Visit | Attending: Orthopedic Surgery | Admitting: Orthopedic Surgery

## 2018-01-16 ENCOUNTER — Encounter (HOSPITAL_COMMUNITY): Payer: Self-pay

## 2018-01-16 ENCOUNTER — Other Ambulatory Visit: Payer: Self-pay

## 2018-01-16 DIAGNOSIS — Z01812 Encounter for preprocedural laboratory examination: Secondary | ICD-10-CM | POA: Diagnosis present

## 2018-01-16 DIAGNOSIS — M949 Disorder of cartilage, unspecified: Secondary | ICD-10-CM | POA: Diagnosis not present

## 2018-01-16 DIAGNOSIS — E039 Hypothyroidism, unspecified: Secondary | ICD-10-CM | POA: Insufficient documentation

## 2018-01-16 DIAGNOSIS — M899 Disorder of bone, unspecified: Secondary | ICD-10-CM | POA: Diagnosis not present

## 2018-01-16 DIAGNOSIS — Z86010 Personal history of colonic polyps: Secondary | ICD-10-CM | POA: Diagnosis not present

## 2018-01-16 DIAGNOSIS — M47812 Spondylosis without myelopathy or radiculopathy, cervical region: Secondary | ICD-10-CM | POA: Insufficient documentation

## 2018-01-16 DIAGNOSIS — M170 Bilateral primary osteoarthritis of knee: Secondary | ICD-10-CM | POA: Insufficient documentation

## 2018-01-16 DIAGNOSIS — R58 Hemorrhage, not elsewhere classified: Secondary | ICD-10-CM | POA: Insufficient documentation

## 2018-01-16 DIAGNOSIS — M19049 Primary osteoarthritis, unspecified hand: Secondary | ICD-10-CM | POA: Diagnosis not present

## 2018-01-16 LAB — CBC WITH DIFFERENTIAL/PLATELET
BASOS ABS: 0.1 10*3/uL (ref 0.0–0.1)
Basophils Relative: 1 %
Eosinophils Absolute: 0.3 10*3/uL (ref 0.0–0.7)
Eosinophils Relative: 6 %
HEMATOCRIT: 38.4 % (ref 36.0–46.0)
HEMOGLOBIN: 12.4 g/dL (ref 12.0–15.0)
LYMPHS ABS: 1.6 10*3/uL (ref 0.7–4.0)
LYMPHS PCT: 27 %
MCH: 31.1 pg (ref 26.0–34.0)
MCHC: 32.3 g/dL (ref 30.0–36.0)
MCV: 96.2 fL (ref 78.0–100.0)
Monocytes Absolute: 0.7 10*3/uL (ref 0.1–1.0)
Monocytes Relative: 12 %
NEUTROS ABS: 3.2 10*3/uL (ref 1.7–7.7)
NEUTROS PCT: 54 %
Platelets: 296 10*3/uL (ref 150–400)
RBC: 3.99 MIL/uL (ref 3.87–5.11)
RDW: 14.3 % (ref 11.5–15.5)
WBC: 5.9 10*3/uL (ref 4.0–10.5)

## 2018-01-16 LAB — COMPREHENSIVE METABOLIC PANEL
ALK PHOS: 82 U/L (ref 38–126)
ALT: 35 U/L (ref 14–54)
AST: 46 U/L — AB (ref 15–41)
Albumin: 3.6 g/dL (ref 3.5–5.0)
Anion gap: 10 (ref 5–15)
BUN: 17 mg/dL (ref 6–20)
CALCIUM: 9 mg/dL (ref 8.9–10.3)
CHLORIDE: 106 mmol/L (ref 101–111)
CO2: 24 mmol/L (ref 22–32)
CREATININE: 0.78 mg/dL (ref 0.44–1.00)
Glucose, Bld: 84 mg/dL (ref 65–99)
Potassium: 4.2 mmol/L (ref 3.5–5.1)
Sodium: 140 mmol/L (ref 135–145)
Total Bilirubin: 0.6 mg/dL (ref 0.3–1.2)
Total Protein: 5.8 g/dL — ABNORMAL LOW (ref 6.5–8.1)

## 2018-01-16 LAB — SURGICAL PCR SCREEN
MRSA, PCR: NEGATIVE
STAPHYLOCOCCUS AUREUS: NEGATIVE

## 2018-01-16 NOTE — Progress Notes (Signed)
PCP is Dr. Vedia Coffer States she saw Dr Atilano [facility]) many years ago. States everything checked out. Denies ever having a card cath or echo. States she had a stress test in 2005. Denies chest pain, cough, or fever.

## 2018-01-24 MED ORDER — TRANEXAMIC ACID 1000 MG/10ML IV SOLN
1000.0000 mg | INTRAVENOUS | Status: AC
  Administered 2018-01-27: 1000 mg via INTRAVENOUS
  Filled 2018-01-24: qty 1100

## 2018-01-25 MED ORDER — BUPIVACAINE LIPOSOME 1.3 % IJ SUSP
20.0000 mL | INTRAMUSCULAR | Status: AC
  Administered 2018-01-27: 20 mL
  Filled 2018-01-25: qty 20

## 2018-01-26 NOTE — Anesthesia Preprocedure Evaluation (Signed)
Anesthesia Evaluation  Patient identified by MRN, date of birth, ID band Patient awake    Reviewed: Allergy & Precautions, NPO status , Patient's Chart, lab work & pertinent test results  Airway Mallampati: II  TM Distance: >3 FB Neck ROM: Full    Dental no notable dental hx.    Pulmonary former smoker,    Pulmonary exam normal breath sounds clear to auscultation       Cardiovascular negative cardio ROS Normal cardiovascular exam Rhythm:Regular Rate:Normal     Neuro/Psych PSYCHIATRIC DISORDERS negative neurological ROS     GI/Hepatic negative GI ROS, Neg liver ROS,   Endo/Other  Hypothyroidism   Renal/GU negative Renal ROS  negative genitourinary   Musculoskeletal  (+) Arthritis , Osteoarthritis,    Abdominal   Peds negative pediatric ROS (+)  Hematology negative hematology ROS (+)   Anesthesia Other Findings   Reproductive/Obstetrics negative OB ROS                             Anesthesia Physical  Anesthesia Plan  ASA: II  Anesthesia Plan: Spinal   Post-op Pain Management:  Regional for Post-op pain   Induction: Intravenous  PONV Risk Score and Plan: 2 and Treatment may vary due to age or medical condition and Ondansetron  Airway Management Planned: Nasal Cannula and Natural Airway  Additional Equipment:   Intra-op Plan:   Post-operative Plan:   Informed Consent: I have reviewed the patients History and Physical, chart, labs and discussed the procedure including the risks, benefits and alternatives for the proposed anesthesia with the patient or authorized representative who has indicated his/her understanding and acceptance.   Dental advisory given  Plan Discussed with: CRNA and Anesthesiologist  Anesthesia Plan Comments: (Discussed spinal and general. Discussed risks/benefits of spinal including headache, backache, failure, bleeding, infection, and nerve damage.  Patient consents to spinal. Questions answered. Coagulation studies and platelet count acceptable.)        Anesthesia Quick Evaluation

## 2018-01-27 ENCOUNTER — Observation Stay (HOSPITAL_COMMUNITY)
Admission: RE | Admit: 2018-01-27 | Discharge: 2018-01-28 | Disposition: A | Discharge status: Home / Self Care | Payer: Medicare Other | Source: Ambulatory Visit | Attending: Orthopedic Surgery | Admitting: Orthopedic Surgery

## 2018-01-27 ENCOUNTER — Other Ambulatory Visit: Payer: Self-pay

## 2018-01-27 ENCOUNTER — Encounter (HOSPITAL_COMMUNITY)
Admission: RE | Disposition: A | Discharge status: Home / Self Care | Payer: Self-pay | Source: Ambulatory Visit | Attending: Orthopedic Surgery

## 2018-01-27 ENCOUNTER — Ambulatory Visit (HOSPITAL_COMMUNITY): Payer: Medicare Other | Admitting: Certified Registered"

## 2018-01-27 ENCOUNTER — Encounter (HOSPITAL_COMMUNITY): Payer: Self-pay | Admitting: *Deleted

## 2018-01-27 DIAGNOSIS — Z87891 Personal history of nicotine dependence: Secondary | ICD-10-CM | POA: Insufficient documentation

## 2018-01-27 DIAGNOSIS — Z7982 Long term (current) use of aspirin: Secondary | ICD-10-CM | POA: Insufficient documentation

## 2018-01-27 DIAGNOSIS — M1711 Unilateral primary osteoarthritis, right knee: Secondary | ICD-10-CM | POA: Diagnosis present

## 2018-01-27 DIAGNOSIS — R2689 Other abnormalities of gait and mobility: Secondary | ICD-10-CM | POA: Insufficient documentation

## 2018-01-27 DIAGNOSIS — E039 Hypothyroidism, unspecified: Secondary | ICD-10-CM | POA: Diagnosis not present

## 2018-01-27 DIAGNOSIS — Z96659 Presence of unspecified artificial knee joint: Secondary | ICD-10-CM

## 2018-01-27 DIAGNOSIS — Z79899 Other long term (current) drug therapy: Secondary | ICD-10-CM | POA: Insufficient documentation

## 2018-01-27 HISTORY — PX: TOTAL KNEE ARTHROPLASTY: SHX125

## 2018-01-27 HISTORY — DX: Migraine, unspecified, not intractable, without status migrainosus: G43.909

## 2018-01-27 LAB — TYPE AND SCREEN
ABO/RH(D): O POS
Antibody Screen: NEGATIVE

## 2018-01-27 LAB — ABO/RH: ABO/RH(D): O POS

## 2018-01-27 SURGERY — ARTHROPLASTY, KNEE, TOTAL
Anesthesia: Spinal | Site: Knee | Laterality: Right

## 2018-01-27 MED ORDER — BUPIVACAINE-EPINEPHRINE (PF) 0.5% -1:200000 IJ SOLN
INTRAMUSCULAR | Status: AC
  Filled 2018-01-27: qty 30

## 2018-01-27 MED ORDER — ACETAMINOPHEN 325 MG PO TABS: 650.0000 mg | ORAL_TABLET | ORAL | Status: DC | PRN

## 2018-01-27 MED ORDER — DEXAMETHASONE SODIUM PHOSPHATE 10 MG/ML IJ SOLN
10.0000 mg | Freq: Once | INTRAMUSCULAR | Status: AC
  Administered 2018-01-28 (×2): 10 mg via INTRAVENOUS
  Filled 2018-01-27: qty 1

## 2018-01-27 MED ORDER — CHLORHEXIDINE GLUCONATE 4 % EX LIQD: 60.0000 mL | Freq: Once | CUTANEOUS | Status: DC

## 2018-01-27 MED ORDER — ACETAMINOPHEN 500 MG PO TABS
ORAL_TABLET | ORAL | Status: AC
  Administered 2018-01-27: 1000 mg via ORAL
  Filled 2018-01-27: qty 2

## 2018-01-27 MED ORDER — METOCLOPRAMIDE HCL 5 MG PO TABS: 5.0000 mg | ORAL_TABLET | Freq: Three times a day (TID) | ORAL | Status: DC | PRN

## 2018-01-27 MED ORDER — GABAPENTIN 300 MG PO CAPS
300.0000 mg | ORAL_CAPSULE | Freq: Three times a day (TID) | ORAL | Status: DC
  Administered 2018-01-27 – 2018-01-28 (×5): 300 mg via ORAL
  Filled 2018-01-27 (×4): qty 1

## 2018-01-27 MED ORDER — ASPIRIN EC 325 MG PO TBEC
325.0000 mg | DELAYED_RELEASE_TABLET | Freq: Two times a day (BID) | ORAL | Status: DC
  Administered 2018-01-27 (×2): 325 mg via ORAL
  Filled 2018-01-27 (×3): qty 1

## 2018-01-27 MED ORDER — DOCUSATE SODIUM 100 MG PO CAPS
100.0000 mg | ORAL_CAPSULE | Freq: Two times a day (BID) | ORAL | Status: DC
  Administered 2018-01-27 – 2018-01-28 (×4): 100 mg via ORAL
  Filled 2018-01-27 (×3): qty 1

## 2018-01-27 MED ORDER — HYDROCODONE-ACETAMINOPHEN 7.5-325 MG PO TABS
1.0000 | ORAL_TABLET | Freq: Four times a day (QID) | ORAL | Status: DC
  Filled 2018-01-27 (×2): qty 1

## 2018-01-27 MED ORDER — METHOCARBAMOL 1000 MG/10ML IJ SOLN: 500.0000 mg | Freq: Four times a day (QID) | INTRAVENOUS | Status: DC | PRN

## 2018-01-27 MED ORDER — ALUM & MAG HYDROXIDE-SIMETH 200-200-20 MG/5ML PO SUSP: 30.0000 mL | ORAL | Status: DC | PRN

## 2018-01-27 MED ORDER — PHENOL 1.4 % MT LIQD: 1.0000 | OROMUCOSAL | Status: DC | PRN

## 2018-01-27 MED ORDER — BISACODYL 5 MG PO TBEC: 5.0000 mg | DELAYED_RELEASE_TABLET | Freq: Every day | ORAL | Status: DC | PRN

## 2018-01-27 MED ORDER — SENNOSIDES-DOCUSATE SODIUM 8.6-50 MG PO TABS: 1.0000 | ORAL_TABLET | Freq: Every evening | ORAL | Status: DC | PRN

## 2018-01-27 MED ORDER — MIDAZOLAM HCL 2 MG/2ML IJ SOLN
INTRAMUSCULAR | Status: DC | PRN
  Administered 2018-01-27: 2 mg via INTRAVENOUS

## 2018-01-27 MED ORDER — LACTATED RINGERS IV SOLN
INTRAVENOUS | Status: DC | PRN
  Administered 2018-01-27 (×2): via INTRAVENOUS

## 2018-01-27 MED ORDER — ONDANSETRON HCL 4 MG/2ML IJ SOLN: 4.0000 mg | Freq: Once | INTRAMUSCULAR | Status: DC | PRN

## 2018-01-27 MED ORDER — ACETAMINOPHEN 325 MG PO TABS: 325.0000 mg | ORAL_TABLET | ORAL | Status: DC | PRN

## 2018-01-27 MED ORDER — SODIUM CHLORIDE 0.9 % IR SOLN
Status: DC | PRN
  Administered 2018-01-27: 3000 mL

## 2018-01-27 MED ORDER — GABAPENTIN 300 MG PO CAPS
300.0000 mg | ORAL_CAPSULE | Freq: Once | ORAL | Status: AC
  Administered 2018-01-27: 300 mg via ORAL

## 2018-01-27 MED ORDER — LEVOTHYROXINE SODIUM 75 MCG PO TABS
75.0000 ug | ORAL_TABLET | Freq: Every day | ORAL | Status: DC
  Administered 2018-01-28: 75 ug via ORAL
  Filled 2018-01-27: qty 1

## 2018-01-27 MED ORDER — WHITE PETROLATUM EX OINT
TOPICAL_OINTMENT | CUTANEOUS | Status: AC
  Administered 2018-01-27: 18:00:00
  Filled 2018-01-27: qty 28.35

## 2018-01-27 MED ORDER — 0.9 % SODIUM CHLORIDE (POUR BTL) OPTIME
TOPICAL | Status: DC | PRN
  Administered 2018-01-27: 1000 mL

## 2018-01-27 MED ORDER — ONDANSETRON HCL 4 MG/2ML IJ SOLN
INTRAMUSCULAR | Status: DC | PRN
  Administered 2018-01-27: 4 mg via INTRAVENOUS

## 2018-01-27 MED ORDER — CEFAZOLIN SODIUM-DEXTROSE 2-4 GM/100ML-% IV SOLN
2.0000 g | INTRAVENOUS | Status: AC
  Administered 2018-01-27: 2 g via INTRAVENOUS

## 2018-01-27 MED ORDER — PROPOFOL 500 MG/50ML IV EMUL
INTRAVENOUS | Status: DC | PRN
  Administered 2018-01-27: 150 ug/kg/min via INTRAVENOUS

## 2018-01-27 MED ORDER — BUPIVACAINE HCL (PF) 0.75 % IJ SOLN
INTRAMUSCULAR | Status: DC | PRN
  Administered 2018-01-27: 20 mL via PERINEURAL

## 2018-01-27 MED ORDER — ACETAMINOPHEN 500 MG PO TABS
1000.0000 mg | ORAL_TABLET | Freq: Once | ORAL | Status: AC
  Administered 2018-01-27: 1000 mg via ORAL

## 2018-01-27 MED ORDER — MEPERIDINE HCL 50 MG/ML IJ SOLN: 6.2500 mg | INTRAMUSCULAR | Status: DC | PRN

## 2018-01-27 MED ORDER — ONDANSETRON HCL 4 MG/2ML IJ SOLN: 4.0000 mg | Freq: Four times a day (QID) | INTRAMUSCULAR | Status: DC | PRN

## 2018-01-27 MED ORDER — CELECOXIB 200 MG PO CAPS
200.0000 mg | ORAL_CAPSULE | Freq: Two times a day (BID) | ORAL | Status: DC
  Administered 2018-01-27 – 2018-01-28 (×4): 200 mg via ORAL
  Filled 2018-01-27 (×3): qty 1

## 2018-01-27 MED ORDER — MENTHOL 3 MG MT LOZG: 1.0000 | LOZENGE | OROMUCOSAL | Status: DC | PRN

## 2018-01-27 MED ORDER — FENTANYL CITRATE (PF) 250 MCG/5ML IJ SOLN
INTRAMUSCULAR | Status: DC | PRN
  Administered 2018-01-27: 50 ug via INTRAVENOUS

## 2018-01-27 MED ORDER — METOCLOPRAMIDE HCL 5 MG/ML IJ SOLN: 5.0000 mg | Freq: Three times a day (TID) | INTRAMUSCULAR | Status: DC | PRN

## 2018-01-27 MED ORDER — ONDANSETRON HCL 4 MG PO TABS: 4.0000 mg | ORAL_TABLET | Freq: Four times a day (QID) | ORAL | Status: DC | PRN

## 2018-01-27 MED ORDER — FENTANYL CITRATE (PF) 100 MCG/2ML IJ SOLN: 25.0000 ug | INTRAMUSCULAR | Status: DC | PRN

## 2018-01-27 MED ORDER — MIDAZOLAM HCL 2 MG/2ML IJ SOLN
INTRAMUSCULAR | Status: AC
  Filled 2018-01-27: qty 2

## 2018-01-27 MED ORDER — CEFAZOLIN SODIUM-DEXTROSE 1-4 GM/50ML-% IV SOLN
1.0000 g | Freq: Four times a day (QID) | INTRAVENOUS | Status: AC
  Administered 2018-01-27 (×2): 1 g via INTRAVENOUS
  Filled 2018-01-27 (×2): qty 50

## 2018-01-27 MED ORDER — ACETAMINOPHEN 650 MG RE SUPP: 650.0000 mg | RECTAL | Status: DC | PRN

## 2018-01-27 MED ORDER — SODIUM CHLORIDE 0.9 % IJ SOLN
INTRAMUSCULAR | Status: DC | PRN
  Administered 2018-01-27: 20 mL

## 2018-01-27 MED ORDER — METHOCARBAMOL 500 MG PO TABS
500.0000 mg | ORAL_TABLET | Freq: Four times a day (QID) | ORAL | Status: DC | PRN
  Administered 2018-01-27 – 2018-01-28 (×3): 500 mg via ORAL
  Filled 2018-01-27 (×3): qty 1

## 2018-01-27 MED ORDER — ACETAMINOPHEN 160 MG/5ML PO SOLN: 325.0000 mg | ORAL | Status: DC | PRN

## 2018-01-27 MED ORDER — OXYCODONE HCL 5 MG PO TABS
5.0000 mg | ORAL_TABLET | ORAL | Status: DC | PRN
  Administered 2018-01-27 – 2018-01-28 (×6): 5 mg via ORAL
  Filled 2018-01-27 (×6): qty 1

## 2018-01-27 MED ORDER — TRANEXAMIC ACID 1000 MG/10ML IV SOLN
1000.0000 mg | Freq: Once | INTRAVENOUS | Status: AC
  Administered 2018-01-27: 1000 mg via INTRAVENOUS
  Filled 2018-01-27: qty 10

## 2018-01-27 MED ORDER — OXYCODONE HCL 5 MG/5ML PO SOLN: 5.0000 mg | Freq: Once | ORAL | Status: DC | PRN

## 2018-01-27 MED ORDER — FLEET ENEMA 7-19 GM/118ML RE ENEM: 1.0000 | ENEMA | Freq: Once | RECTAL | Status: DC | PRN

## 2018-01-27 MED ORDER — KETOROLAC TROMETHAMINE 30 MG/ML IJ SOLN
30.0000 mg | Freq: Once | INTRAMUSCULAR | Status: DC | PRN
Start: 2018-01-27 — End: 2018-01-27

## 2018-01-27 MED ORDER — BUPIVACAINE-EPINEPHRINE 0.5% -1:200000 IJ SOLN
INTRAMUSCULAR | Status: DC | PRN
  Administered 2018-01-27: 30 mL

## 2018-01-27 MED ORDER — FENTANYL CITRATE (PF) 250 MCG/5ML IJ SOLN
INTRAMUSCULAR | Status: AC
  Filled 2018-01-27: qty 5

## 2018-01-27 MED ORDER — DEXAMETHASONE SODIUM PHOSPHATE 10 MG/ML IJ SOLN: 8.0000 mg | Freq: Once | INTRAMUSCULAR | Status: DC

## 2018-01-27 MED ORDER — ZOLPIDEM TARTRATE 5 MG PO TABS: 5.0000 mg | ORAL_TABLET | Freq: Every evening | ORAL | Status: DC | PRN

## 2018-01-27 MED ORDER — GABAPENTIN 300 MG PO CAPS
ORAL_CAPSULE | ORAL | Status: AC
  Administered 2018-01-27: 300 mg via ORAL
  Filled 2018-01-27: qty 1

## 2018-01-27 MED ORDER — OXYCODONE HCL 5 MG PO TABS: 5.0000 mg | ORAL_TABLET | Freq: Once | ORAL | Status: DC | PRN

## 2018-01-27 MED ORDER — DIPHENHYDRAMINE HCL 12.5 MG/5ML PO ELIX: 12.5000 mg | ORAL_SOLUTION | ORAL | Status: DC | PRN

## 2018-01-27 MED ORDER — DEXAMETHASONE SODIUM PHOSPHATE 10 MG/ML IJ SOLN
INTRAMUSCULAR | Status: AC
  Filled 2018-01-27: qty 1

## 2018-01-27 MED ORDER — DEXAMETHASONE SODIUM PHOSPHATE 10 MG/ML IJ SOLN
INTRAMUSCULAR | Status: DC | PRN
  Administered 2018-01-27: 10 mg via INTRAVENOUS

## 2018-01-27 MED ORDER — CEFAZOLIN SODIUM-DEXTROSE 2-4 GM/100ML-% IV SOLN
INTRAVENOUS | Status: AC
  Filled 2018-01-27: qty 100

## 2018-01-27 MED ORDER — PHENYLEPHRINE HCL 10 MG/ML IJ SOLN
INTRAVENOUS | Status: DC | PRN
  Administered 2018-01-27: 40 ug/min via INTRAVENOUS

## 2018-01-27 MED ORDER — HYDROMORPHONE HCL 1 MG/ML IJ SOLN: 1.0000 mg | INTRAMUSCULAR | Status: DC | PRN

## 2018-01-27 SURGICAL SUPPLY — 61 items
BANDAGE ACE 6X5 VEL STRL LF (GAUZE/BANDAGES/DRESSINGS) ×2 IMPLANT
BANDAGE ELASTIC 6 VELCRO ST LF (GAUZE/BANDAGES/DRESSINGS) ×1 IMPLANT
BANDAGE ESMARK 6X9 LF (GAUZE/BANDAGES/DRESSINGS) ×1 IMPLANT
BLADE SAGITTAL 13X1.27X60 (BLADE) ×2 IMPLANT
BLADE SAW SGTL 83.5X18.5 (BLADE) ×2 IMPLANT
BLADE SURG 10 STRL SS (BLADE) ×2 IMPLANT
BNDG CMPR 9X6 STRL LF SNTH (GAUZE/BANDAGES/DRESSINGS) ×1
BNDG ESMARK 6X9 LF (GAUZE/BANDAGES/DRESSINGS) ×2
BOWL SMART MIX CTS (DISPOSABLE) ×2 IMPLANT
CAPT KNEE TOTAL 3 ×2 IMPLANT
CEMENT BONE SIMPLEX SPEEDSET (Cement) ×4 IMPLANT
COVER SURGICAL LIGHT HANDLE (MISCELLANEOUS) ×2 IMPLANT
CUFF TOURNIQUET SINGLE 34IN LL (TOURNIQUET CUFF) ×2 IMPLANT
DRAPE EXTREMITY T 121X128X90 (DRAPE) ×2 IMPLANT
DRAPE HALF SHEET 40X57 (DRAPES) ×2 IMPLANT
DRAPE INCISE IOBAN 66X45 STRL (DRAPES) ×4 IMPLANT
DRAPE U-SHAPE 47X51 STRL (DRAPES) ×2 IMPLANT
DRSG AQUACEL AG ADV 3.5X10 (GAUZE/BANDAGES/DRESSINGS) ×2 IMPLANT
DURAPREP 26ML APPLICATOR (WOUND CARE) ×4 IMPLANT
ELECT REM PT RETURN 9FT ADLT (ELECTROSURGICAL) ×2
ELECTRODE REM PT RTRN 9FT ADLT (ELECTROSURGICAL) ×1 IMPLANT
GLOVE BIOGEL M 7.0 STRL (GLOVE) IMPLANT
GLOVE BIOGEL PI IND STRL 7.5 (GLOVE) IMPLANT
GLOVE BIOGEL PI IND STRL 8.5 (GLOVE) ×1 IMPLANT
GLOVE BIOGEL PI INDICATOR 7.5 (GLOVE)
GLOVE BIOGEL PI INDICATOR 8.5 (GLOVE) ×1
GLOVE SURG ORTHO 8.0 STRL STRW (GLOVE) ×4 IMPLANT
GOWN STRL REUS W/ TWL LRG LVL3 (GOWN DISPOSABLE) ×1 IMPLANT
GOWN STRL REUS W/ TWL XL LVL3 (GOWN DISPOSABLE) ×2 IMPLANT
GOWN STRL REUS W/TWL 2XL LVL3 (GOWN DISPOSABLE) ×2 IMPLANT
GOWN STRL REUS W/TWL LRG LVL3 (GOWN DISPOSABLE) ×2
GOWN STRL REUS W/TWL XL LVL3 (GOWN DISPOSABLE) ×4
HANDPIECE INTERPULSE COAX TIP (DISPOSABLE) ×2
HOOD PEEL AWAY FACE SHEILD DIS (HOOD) ×7 IMPLANT
KIT BASIN OR (CUSTOM PROCEDURE TRAY) ×2 IMPLANT
KIT ROOM TURNOVER OR (KITS) ×2 IMPLANT
KNEE CAPITATED TOTAL 3 IMPLANT
MANIFOLD NEPTUNE II (INSTRUMENTS) ×2 IMPLANT
NDL 18GX1X1/2 (RX/OR ONLY) (NEEDLE) IMPLANT
NEEDLE 18GX1X1/2 (RX/OR ONLY) (NEEDLE) IMPLANT
NEEDLE 22X1 1/2 (OR ONLY) (NEEDLE) ×4 IMPLANT
NS IRRIG 1000ML POUR BTL (IV SOLUTION) ×2 IMPLANT
PACK TOTAL JOINT (CUSTOM PROCEDURE TRAY) ×2 IMPLANT
PAD ARMBOARD 7.5X6 YLW CONV (MISCELLANEOUS) ×4 IMPLANT
SET HNDPC FAN SPRY TIP SCT (DISPOSABLE) ×1 IMPLANT
STRIP CLOSURE SKIN 1/2X4 (GAUZE/BANDAGES/DRESSINGS) ×2 IMPLANT
SUCTION FRAZIER HANDLE 10FR (MISCELLANEOUS)
SUCTION TUBE FRAZIER 10FR DISP (MISCELLANEOUS) IMPLANT
SUT BONE WAX W31G (SUTURE) ×2 IMPLANT
SUT MNCRL AB 3-0 PS2 18 (SUTURE) ×2 IMPLANT
SUT VIC AB 0 CTB1 27 (SUTURE) ×2 IMPLANT
SUT VIC AB 1 CT1 27 (SUTURE) ×6
SUT VIC AB 1 CT1 27XBRD ANBCTR (SUTURE) ×2 IMPLANT
SUT VIC AB 2-0 CT1 27 (SUTURE) ×4
SUT VIC AB 2-0 CT1 TAPERPNT 27 (SUTURE) ×2 IMPLANT
SUT VLOC 180 0 24IN GS25 (SUTURE) ×2 IMPLANT
SYR 20CC LL (SYRINGE) ×4 IMPLANT
TOWEL OR 17X24 6PK STRL BLUE (TOWEL DISPOSABLE) ×2 IMPLANT
TOWEL OR 17X26 10 PK STRL BLUE (TOWEL DISPOSABLE) ×2 IMPLANT
TRAY CATH 16FR W/PLASTIC CATH (SET/KITS/TRAYS/PACK) ×1 IMPLANT
WRAP KNEE MAXI GEL POST OP (GAUZE/BANDAGES/DRESSINGS) ×2 IMPLANT

## 2018-01-27 NOTE — Op Note (Signed)
TOTAL KNEE REPLACEMENT OPERATIVE NOTE:  01/27/2018  1:47 PM  PATIENT:  Erin Carroll  72 y.o. female  PRE-OPERATIVE DIAGNOSIS:  primary osteoarthritis right knee  POST-OPERATIVE DIAGNOSIS:  primary osteoarthritis right knee  PROCEDURE:  Procedure(s): TOTAL KNEE ARTHROPLASTY  SURGEON:  Surgeon(s): Vickey Huger, MD  PHYSICIAN ASSISTANT: Carlyon Shadow, PAC  ANESTHESIA:   spinal  DRAINS: Hemovac  SPECIMEN: None  COUNTS:  Correct  TOURNIQUET:   Total Tourniquet Time Documented: Thigh (Right) - 33 minutes Total: Thigh (Right) - 33 minutes   DICTATION:  Indication for procedure:    The patient is a 72 y.o. female who has failed conservative treatment for primary osteoarthritis right knee.  Informed consent was obtained prior to anesthesia. The risks versus benefits of the operation were explain and in a way the patient can, and did, understand.   On the implant demand matching protocol, this patient scored 10.  Therefore, this patient was not receive a polyethylene insert with vitamin E which is a high demand implant.  Description of procedure:     The patient was taken to the operating room and placed under anesthesia.  The patient was positioned in the usual fashion taking care that all body parts were adequately padded and/or protected.  I foley catheter was not placed.  A tourniquet was applied and the leg prepped and draped in the usual sterile fashion.  The extremity was exsanguinated with the esmarch and tourniquet inflated to 350 mmHg.  Pre-operative range of motion was normal.  The knee was in 4 degree of mild valgus.  A midline incision approximately 6-7 inches long was made with a #10 blade.  A new blade was used to make a parapatellar arthrotomy going 2-3 cm into the quadriceps tendon, over the patella, and alongside the medial aspect of the patellar tendon.  A synovectomy was then performed with the #10 blade and forceps. I then elevated the deep MCL off the  medial tibial metaphysis subperiosteally around to the semimembranosus attachment.    I everted the patella and used calipers to measure patellar thickness.  I used the reamer to ream down to appropriate thickness to recreate the native thickness.  I then removed excess bone with the rongeur and sagittal saw.  I used the appropriately sized template and drilled the three lug holes.  I then put the trial in place and measured the thickness with the calipers to ensure recreation of the native thickness.  The trial was then removed and the patella subluxed and the knee brought into flexion.  A homan retractor was place to retract and protect the patella and lateral structures.  A Z-retractor was place medially to protect the medial structures.  The extra-medullary alignment system was used to make cut the tibial articular surface perpendicular to the anamotic axis of the tibia and in 3 degrees of posterior slope.  The cut surface and alignment jig was removed.  I then used the intramedullary alignment guide to make a 4 valgus cut on the distal femur.  I then marked out the epicondylar axis on the distal femur.  The posterior condylar axis measured 3 degrees.  I then used the anterior referencing sizer and measured the femur to be a size 5.  The 4-In-1 cutting block was screwed into place in external rotation matching the posterior condylar angle, making our cuts perpendicular to the epicondylar axis.  Anterior, posterior and chamfer cuts were made with the sagittal saw.  The cutting block and cut pieces were  removed.  A lamina spreader was placed in 90 degrees of flexion.  The ACL, PCL, menisci, and posterior condylar osteophytes were removed.  A 10 mm spacer blocked was found to offer good flexion and extension gap balance after mild in degree releasing.   The scoop retractor was then placed and the femoral finishing block was pinned in place.  The small sagittal saw was used as well as the lug drill to finish  the femur.  The block and cut surfaces were removed and the medullary canal hole filled with autograft bone from the cut pieces.  The tibia was delivered forward in deep flexion and external rotation.  A size D tray was selected and pinned into place centered on the medial 1/3 of the tibial tubercle.  The reamer and keel was used to prepare the tibia through the tray.    I then trialed with the size 5 femur, size D tibia, a 10 mm insert and the 32 patella.  I had excellent flexion/extension gap balance, excellent patella tracking.  Flexion was full and beyond 120 degrees; extension was zero.  These components were chosen and the staff opened them to me on the back table while the knee was lavaged copiously and the cement mixed.  The soft tissue was infiltrated with 60cc of exparel 1.3% through a 21 gauge needle.  I cemented in the components and removed all excess cement.  The polyethylene tibial component was snapped into place and the knee placed in extension while cement was hardening.  The capsule was infilltrated with 30cc of .25% Marcaine with epinephrine.  A hemovac was place in the joint exiting superolaterally.  A pain pump was place superomedially superficial to the arthrotomy.  Once the cement was hard, the tourniquet was let down.  Hemostasis was obtained.  The arthrotomy was closed with figure-8 #1 vicryl sutures.  The deep soft tissues were closed with #0 vicryls and the subcuticular layer closed with a running #2-0 vicryl.  The skin was reapproximated and closed with skin staples.  The wound was dressed with xeroform, 4 x4's, 2 ABD sponges, a single layer of webril and a TED stocking.   The patient was then awakened, extubated, and taken to the recovery room in stable condition.  BLOOD LOSS:  300cc DRAINS: 1 hemovac, 1 pain catheter COMPLICATIONS:  None.  PLAN OF CARE: Admit for overnight observation  PATIENT DISPOSITION:  PACU - hemodynamically stable.   Delay start of  Pharmacological VTE agent (>24hrs) due to surgical blood loss or risk of bleeding:  not applicable  Please fax a copy of this op note to my office at [phone] (please only include page 1 and 2 of the Case Information op note)

## 2018-01-27 NOTE — Evaluation (Signed)
Physical Therapy Evaluation Patient Details Name: Erin Carroll MRN: [id number] DOB: 11-08-1946 Today's Date: 01/27/2018   History of Present Illness  Pt is a 72 y/o female s/p elective R TKA. PMH includes arthritis, L hip bursa excision, and L TKA.   Clinical Impression  Pt is s/p surgery above with deficits below. Pt tolerated gait well this session and required min guard for mobility. Reviewed supine HEP and knee precautions. Will continue to follow acutely to maximize functional mobility independence and safety.     Follow Up Recommendations Follow surgeon's recommendation for DC plan and follow-up therapies;Supervision for mobility/OOB    Equipment Recommendations  None recommended by PT    Recommendations for Other Services       Precautions / Restrictions Precautions Precautions: Knee Precaution Booklet Issued: Yes (comment) Precaution Comments: Reviewed supine ther ex and knee precautions with pt.  Restrictions Weight Bearing Restrictions: Yes RLE Weight Bearing: Weight bearing as tolerated      Mobility  Bed Mobility Overal bed mobility: Needs Assistance Bed Mobility: Supine to Sit     Supine to sit: Supervision     General bed mobility comments: Supervision for safety.   Transfers Overall transfer level: Needs assistance Equipment used: Rolling walker (2 wheeled) Transfers: Sit to/from Stand Sit to Stand: Min guard         General transfer comment: Min guard for safety. Verbal cues for safe hand placement.   Ambulation/Gait Ambulation/Gait assistance: Min guard Ambulation Distance (Feet): 75 Feet Assistive device: Rolling walker (2 wheeled) Gait Pattern/deviations: Step-to pattern;Step-through pattern;Decreased step length - left;Decreased step length - right;Decreased weight shift to right;Antalgic Gait velocity: Decreased  Gait velocity interpretation: Below normal speed for age/gender General Gait Details: Slow, cautious gait. Slightly antalgic  secondary to RLE pain. Verbal cues for sequencing using RW. Able to progress to step through pattern, however, continued to present with decreased weightshift to R.   Stairs            Wheelchair Mobility    Modified Rankin (Stroke Patients Only)       Balance Overall balance assessment: Needs assistance Sitting-balance support: No upper extremity supported;Feet supported Sitting balance-Leahy Scale: Good     Standing balance support: Bilateral upper extremity supported;During functional activity Standing balance-Leahy Scale: Poor Standing balance comment: Reliant on bUE support                              Pertinent Vitals/Pain Pain Assessment: 0-10 Pain Score: 4  Pain Location: R knee  Pain Descriptors / Indicators: Aching;Operative site guarding    Home Living Family/patient expects to be discharged to:: Private residence Living Arrangements: Spouse/significant other Available Help at Discharge: Family;Available 24 hours/day Type of Home: House Home Access: Stairs to enter Entrance Stairs-Rails: Psychiatric nurse of Steps: 2 Home Layout: Multi-level Home Equipment: Clinical cytogeneticist - 2 wheels;Bedside commode;Cane - single point;Grab bars - toilet;Grab bars - tub/shower Additional Comments: Will be staying in basement. Has level entry to get to basement, however, would have to walk through the grass to get to that entry.     Prior Function Level of Independence: Independent               Hand Dominance   Dominant Hand: Right    Extremity/Trunk Assessment   Upper Extremity Assessment Upper Extremity Assessment: Defer to OT evaluation    Lower Extremity Assessment Lower Extremity Assessment: RLE deficits/detail RLE Deficits / Details: Pt reports  decreased sensation in RLE. Able to perform ther ex below. Deficits consistent with post op pain and weakness.     Cervical / Trunk Assessment Cervical / Trunk Assessment:  Normal  Communication   Communication: No difficulties  Cognition Arousal/Alertness: Awake/alert Behavior During Therapy: WFL for tasks assessed/performed Overall Cognitive Status: Within Functional Limits for tasks assessed                                        General Comments      Exercises Total Joint Exercises Ankle Circles/Pumps: AROM;Both;20 reps Quad Sets: AROM;Right;10 reps Towel Squeeze: AROM;Both;10 reps Heel Slides: AROM;Right;10 reps   Assessment/Plan    PT Assessment Patient needs continued PT services  PT Problem List Decreased strength;Decreased range of motion;Decreased balance;Decreased mobility;Decreased knowledge of use of DME;Pain;Impaired sensation       PT Treatment Interventions DME instruction;Gait training;Therapeutic activities;Functional mobility training;Stair training;Therapeutic exercise;Balance training;Neuromuscular re-education;Patient/family education    PT Goals (Current goals can be found in the Care Plan section)  Acute Rehab PT Goals Patient Stated Goal: to go home  PT Goal Formulation: With patient Time For Goal Achievement: 02/10/18 Potential to Achieve Goals: Good    Frequency 7X/week   Barriers to discharge        Co-evaluation               AM-PAC PT "6 Clicks" Daily Activity  Outcome Measure Difficulty turning over in bed (including adjusting bedclothes, sheets and blankets)?: A Little Difficulty moving from lying on back to sitting on the side of the bed? : A Little Difficulty sitting down on and standing up from a chair with arms (e.g., wheelchair, bedside commode, etc,.)?: Unable Help needed moving to and from a bed to chair (including a wheelchair)?: A Little Help needed walking in hospital room?: A Little Help needed climbing 3-5 steps with a railing? : A Little 6 Click Score: 16    End of Session Equipment Utilized During Treatment: Gait belt Activity Tolerance: Patient tolerated treatment  well Patient left: in chair;with call bell/phone within reach;with nursing/sitter in room Nurse Communication: Mobility status PT Visit Diagnosis: Other abnormalities of gait and mobility (R26.89);Pain Pain - Right/Left: Right Pain - part of body: Knee    Time: 6578-4696 PT Time Calculation (min) (ACUTE ONLY): 31 min   Charges:   PT Evaluation $PT Eval Low Complexity: 1 Low PT Treatments $Gait Training: 8-22 mins   PT G Codes:        Leighton Ruff, PT, DPT  Acute Rehabilitation Services  Pager: [phone]   Rudean Hitt 01/27/2018, 3:07 PM

## 2018-01-27 NOTE — Anesthesia Procedure Notes (Signed)
Anesthesia Regional Block: Adductor canal block   Pre-Anesthetic Checklist: ,, timeout performed, Correct Patient, Correct Site, Correct Laterality, Correct Procedure, Correct Position, site marked, Risks and benefits discussed,  Surgical consent,  Pre-op evaluation,  At surgeon's request and post-op pain management  Laterality: Right  Prep: chloraprep       Needles:  Injection technique: Single-shot  Needle Type: Echogenic Stimulator Needle     Needle Length: 5cm  Needle Gauge: 22     Additional Needles:   Procedures:, nerve stimulator,,, ultrasound used (permanent image in chart),,,,   Nerve Stimulator or Paresthesia:  Response: quadraceps contraction, 0.45 mA,   Additional Responses:   Narrative:  Start time: 01/27/2018 7:02 AM End time: 01/27/2018 7:08 AM Injection made incrementally with aspirations every 5 mL.  Performed by: Personally  Anesthesiologist: Janeece Riggers, MD  Additional Notes: Functioning IV was confirmed and monitors were applied.  A 41mm 22ga Arrow echogenic stimulator needle was used. Sterile prep and drape,hand hygiene and sterile gloves were used. Ultrasound guidance: relevant anatomy identified, needle position confirmed, local anesthetic spread visualized around nerve(s)., vascular puncture avoided.  Image printed for medical record. Negative aspiration and negative test dose prior to incremental administration of local anesthetic. The patient tolerated the procedure well.

## 2018-01-27 NOTE — Transfer of Care (Signed)
Immediate Anesthesia Transfer of Care Note  Patient: Erin Carroll  Procedure(s) Performed: TOTAL KNEE ARTHROPLASTY (Right Knee)  Patient Location: PACU  Anesthesia Type:MAC combined with regional for post-op pain  Level of Consciousness: awake, alert  and oriented  Airway & Oxygen Therapy: Patient Spontanous Breathing and Patient connected to face mask oxygen  Post-op Assessment: Report given to RN and Post -op Vital signs reviewed and stable  Post vital signs: Reviewed and stable  Last Vitals:  Vitals:   01/27/18 0616  BP: 115/68  Pulse: (!) 53  Resp: 18  Temp: 36.6 C  SpO2: 98%    Last Pain:  Vitals:   01/27/18 0616  TempSrc: Oral      Patients Stated Pain Goal: 2 (88/32/54 9826)  Complications: No apparent anesthesia complications

## 2018-01-27 NOTE — OR Nursing (Signed)
6986: in&out cath=400cc cyu, per protocol, no trauma.

## 2018-01-27 NOTE — Anesthesia Procedure Notes (Signed)
Procedure Name: MAC Date/Time: 01/27/2018 7:52 AM Performed by: Imagene Riches, CRNA Pre-anesthesia Checklist: Patient identified, Emergency Drugs available, Suction available and Patient being monitored Patient Re-evaluated:Patient Re-evaluated prior to induction Oxygen Delivery Method: Simple face mask Preoxygenation: Pre-oxygenation with 100% oxygen

## 2018-01-27 NOTE — H&P (Signed)
Erin Carroll MRN:  [id number] DOB/SEX:  01/21/1946/female  CHIEF COMPLAINT:  Painful right Knee  HISTORY: Patient is a 72 y.o. female presented with a history of pain in the right knee. Onset of symptoms was gradual starting a few years ago with gradually worsening course since that time. Patient has been treated conservatively with over-the-counter NSAIDs and activity modification. Patient currently rates pain in the knee at 10 out of 10 with activity. There is pain at night.  PAST MEDICAL HISTORY: Patient Active Problem List   Diagnosis Date Noted  . Abnormal ECG 05/03/2015  . 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 OA (osteoarthritis) of knee 01/10/2015  . Arthritis of knee, degenerative 01/10/2015  . Bursitis, hip 01/08/2014  . Ecchymosis 12/29/2013  . Annual physical exam 11/14/2012  . FECAL INCONTINENCE 03/14/2010  . PERSONAL HX COLONIC POLYPS 03/14/2010  . ARTHRITIS, HANDS, BILATERAL 11/15/2009  . ARTHRITIS, KNEES, BILATERAL 12/07/2008  . HYPOTHYROIDISM 09/28/2008  . 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 OSTEOPENIA 09/28/2008   Past Medical History:  Diagnosis Date  . Alcoholism /alcohol abuse ([place])    sober since 69  . Arthritis    hands, knees.  . Hypothyroidism   . Thyroid disease    problems with thyroid   Past Surgical History:  Procedure Laterality Date  . BREAST BIOPSY  1977   left  . COLONOSCOPY    . EXCISION/RELEASE BURSA HIP Left 01/08/2014   Procedure: LEFT HIP BURSECTOMY WITH GLUTEAL TENDON REPAIR;  Surgeon: [name], MD;  Location: [facility];  Service: Orthopedics;  Laterality: Left;  With Anchors  . FINGER SURGERY Left 12-31-13   left middle finger cyst -aspirated  . METACARPOPHALANGEAL JOINT ARTHROPLASTY Bilateral    bilateral thumbs- [place]  . RECTAL PROLAPSE REPAIR  MAY 2015  . right knee arthroscopy  1987/1991  . SHOULDER ARTHROSCOPY  2004  . TONSILLECTOMY    . TOTAL KNEE ARTHROPLASTY Left 01/10/2015   Procedure: LEFT TOTAL  KNEE ARTHROPLASTY;  Surgeon: [name], MD;  Location: [facility];  Service: Orthopedics;  Laterality: Left;     MEDICATIONS:   Medications Prior to Admission  Medication Sig Dispense Refill Last Dose  . aspirin-acetaminophen-caffeine (EXCEDRIN MIGRAINE) 250-250-65 MG tablet Take 2 tablets by mouth daily as needed for headache.   Past Month at Unknown time  . Biotin 5000 MCG CAPS Take 5,000 mcg by mouth daily.   Past Month at Unknown time  . celecoxib (CELEBREX) 200 MG capsule Take 200 mg by mouth daily.   Past Month at Unknown time  . doxycycline (VIBRAMYCIN) 100 MG capsule Take 100 mg by mouth 2 (two) times daily.  0 Past Month at Unknown time  . levothyroxine (SYNTHROID, LEVOTHROID) 75 MCG tablet Take 1 tablet (75 mcg total) by mouth daily before breakfast. (Patient taking differently: Take 75 mcg by mouth daily before breakfast. Brand name.) 90 tablet 1 01/26/2018 at Unknown time  . Multiple Vitamin (MULTIVITAMIN) tablet Take 1 tablet by mouth daily.   Past Month at Unknown time  . phenylephrine (SUDAFED PE) 10 MG TABS tablet Take 10 mg by mouth 2 (two) times daily as needed (allergies).   Past Month at Unknown time  . pyridOXINE (VITAMIN B-6) 100 MG tablet Take 100 mg by mouth daily.   Past Month at Unknown time  . chlorpheniramine (CHLOR-TRIMETON) 4 MG tablet Take 2 mg by mouth 2 (two) times daily as needed for allergies.   More than a month at Unknown time  ALLERGIES:  No Known Allergies  REVIEW OF SYSTEMS:  A comprehensive review of systems was negative except for: Musculoskeletal: positive for arthralgias and bone pain   FAMILY HISTORY:   Family History  Problem Relation Age of Onset  . Arthritis Mother   . Heart attack Father   . Aneurysm Father        intercranial  . Colon cancer Neg Hx     SOCIAL HISTORY:   Social History   Tobacco Use  . Smoking status: Former Smoker    Last attempt to quit: 12/04/1987    Years since quitting: 30.1  . Smokeless tobacco: Never  Used  Substance Use Topics  . Alcohol use: No    Comment: HX. ETOH abuse -Quit 17 yrs ago     EXAMINATION:  Vital signs in last 24 hours: Temp:  [97.9 F (36.6 C)] 97.9 F (36.6 C) (02/25 0616) Pulse Rate:  [53] 53 (02/25 0616) Resp:  [18] 18 (02/25 0616) BP: (115)/(68) 115/68 (02/25 0616) SpO2:  [98 %] 98 % (02/25 0616) Weight:  [51.5 kg (113 lb 9.6 oz)] 51.5 kg (113 lb 9.6 oz) (02/25 0601)  BP 115/68   Pulse (!) 53   Temp 97.9 F (36.6 C) (Oral)   Resp 18   Ht 5' 1.5" (1.562 m)   Wt 51.5 kg (113 lb 9.6 oz)   SpO2 98%   BMI 21.12 kg/m   General Appearance:    Alert, cooperative, no distress, appears stated age  Head:    Normocephalic, without obvious abnormality, atraumatic  Eyes:    PERRL, conjunctiva/corneas clear, EOM's intact, fundi    benign, both eyes  Ears:    Normal TM's and external ear canals, both ears  Nose:   Nares normal, septum midline, mucosa normal, no drainage    or sinus tenderness  Throat:   Lips, mucosa, and tongue normal; teeth and gums normal  Neck:   Supple, symmetrical, trachea midline, no adenopathy;    thyroid:  no enlargement/tenderness/nodules; no carotid   bruit or JVD  Back:     Symmetric, no curvature, ROM normal, no CVA tenderness  Lungs:     Clear to auscultation bilaterally, respirations unlabored  Chest Wall:    No tenderness or deformity   Heart:    Regular rate and rhythm, S1 and S2 normal, no murmur, rub   or gallop  Breast Exam:    No tenderness, masses, or nipple abnormality  Abdomen:     Soft, non-tender, bowel sounds active all four quadrants,    no masses, no organomegaly  Genitalia:    Normal female without lesion, discharge or tenderness  Rectal:    Normal tone, no masses or tenderness;   guaiac negative stool  Extremities:   Extremities normal, atraumatic, no cyanosis or edema  Pulses:   2+ and symmetric all extremities  Skin:   Skin color, texture, turgor normal, no rashes or lesions  Lymph nodes:   Cervical,  supraclavicular, and axillary nodes normal  Neurologic:   CNII-XII intact, normal strength, sensation and reflexes    throughout    Musculoskeletal:  ROM 0-120, Ligaments intact,  Imaging Review Plain radiographs demonstrate severe degenerative joint disease of the right knee. The overall alignment is neutral. The bone quality appears to be good for age and reported activity level.  Assessment/Plan: Primary osteoarthritis, right knee   The patient history, physical examination and imaging studies are consistent with advanced degenerative joint disease of the right knee. The patient has  failed conservative treatment.  The clearance notes were reviewed.  After discussion with the patient it was felt that Total Knee Replacement was indicated. The procedure,  risks, and benefits of total knee arthroplasty were presented and reviewed. The risks including but not limited to aseptic loosening, infection, blood clots, vascular injury, stiffness, patella tracking problems complications among others were discussed. The patient acknowledged the explanation, agreed to proceed with the plan.  Donia Ast 01/27/2018, 6:18 AM

## 2018-01-27 NOTE — Anesthesia Postprocedure Evaluation (Signed)
Anesthesia Post Note  Patient: Gwen Pounds Hagmann  Procedure(s) Performed: TOTAL KNEE ARTHROPLASTY (Right Knee)     Patient location during evaluation: PACU Anesthesia Type: Spinal Level of consciousness: oriented and awake and alert Pain management: pain level controlled Vital Signs Assessment: post-procedure vital signs reviewed and stable Respiratory status: spontaneous breathing, respiratory function stable and patient connected to nasal cannula oxygen Cardiovascular status: blood pressure returned to baseline and stable Postop Assessment: no headache, no backache and no apparent nausea or vomiting Anesthetic complications: no    Last Vitals:  Vitals:   01/27/18 0932 01/27/18 0933  BP: (!) 95/57 (!) 95/57  Pulse: (!) 58 (!) 54  Resp: 13 13  Temp:    SpO2: 98% 98%    Last Pain:  Vitals:   01/27/18 0616  TempSrc: Oral                 Arelis Neumeier

## 2018-01-27 NOTE — Anesthesia Procedure Notes (Signed)
Spinal  Patient location during procedure: OR Start time: 01/27/2018 7:40 AM End time: 01/27/2018 7:45 AM Staffing Anesthesiologist: Janeece Riggers, MD Preanesthetic Checklist Completed: patient identified, site marked, surgical consent, pre-op evaluation, timeout performed, IV checked, risks and benefits discussed and monitors and equipment checked Spinal Block Patient position: sitting Prep: DuraPrep Patient monitoring: heart rate, cardiac monitor, continuous pulse ox and blood pressure Approach: midline Location: L4-5 Injection technique: single-shot Needle Needle type: Sprotte  Needle gauge: 24 G Needle length: 9 cm Assessment Sensory level: T4

## 2018-01-27 NOTE — Progress Notes (Signed)
Orthopedic Tech Progress Note Patient Details:  Erin Carroll 03/11/1946 [id number]  CPM Right Knee CPM Right Knee: On Right Knee Flexion (Degrees): 90 Right Knee Extension (Degrees): 0 Additional Comments: trapeze bar patient helper  Post Interventions Patient Tolerated: Well Instructions Provided: Care of device  Hildred Priest 01/27/2018, 9:47 AM Viewed order from doctor's order list

## 2018-01-28 ENCOUNTER — Encounter (HOSPITAL_COMMUNITY): Payer: Self-pay | Admitting: Orthopedic Surgery

## 2018-01-28 DIAGNOSIS — M1711 Unilateral primary osteoarthritis, right knee: Secondary | ICD-10-CM | POA: Diagnosis not present

## 2018-01-28 LAB — BASIC METABOLIC PANEL
Anion gap: 8 (ref 5–15)
BUN: 12 mg/dL (ref 6–20)
CALCIUM: 8.9 mg/dL (ref 8.9–10.3)
CO2: 26 mmol/L (ref 22–32)
Chloride: 107 mmol/L (ref 101–111)
Creatinine, Ser: 0.78 mg/dL (ref 0.44–1.00)
GFR calc non Af Amer: >60 mL/min (ref >60)
GLUCOSE: 92 mg/dL (ref 65–99)
POTASSIUM: 3.5 mmol/L (ref 3.5–5.1)
SODIUM: 141 mmol/L (ref 135–145)

## 2018-01-28 LAB — CBC
HCT: 30.8 % — ABNORMAL LOW (ref 36.0–46.0)
Hemoglobin: 10.2 g/dL — ABNORMAL LOW (ref 12.0–15.0)
MCH: 32.2 pg (ref 26.0–34.0)
MCHC: 33.1 g/dL (ref 30.0–36.0)
MCV: 97.2 fL (ref 78.0–100.0)
PLATELETS: 214 10*3/uL (ref 150–400)
RBC: 3.17 MIL/uL — AB (ref 3.87–5.11)
RDW: 14.6 % (ref 11.5–15.5)
WBC: 9.7 10*3/uL (ref 4.0–10.5)

## 2018-01-28 MED ORDER — ASPIRIN 325 MG PO TBEC: 325.0000 mg | DELAYED_RELEASE_TABLET | Freq: Two times a day (BID) | ORAL | 0 refills | Status: DC

## 2018-01-28 MED ORDER — METHOCARBAMOL 500 MG PO TABS: 500.0000 mg | ORAL_TABLET | Freq: Four times a day (QID) | ORAL | 0 refills | Status: DC | PRN

## 2018-01-28 MED ORDER — OXYCODONE HCL 10 MG PO TABS: 10.0000 mg | ORAL_TABLET | ORAL | 0 refills | Status: DC | PRN

## 2018-01-28 MED ORDER — GABAPENTIN 300 MG PO CAPS: 300.0000 mg | ORAL_CAPSULE | Freq: Three times a day (TID) | ORAL | 0 refills | Status: DC | PRN

## 2018-01-28 NOTE — Progress Notes (Signed)
Physical Therapy Treatment Patient Details Name: Erin Carroll MRN: [id number] DOB: August 28, 1946 Today's Date: 01/28/2018    History of Present Illness Pt is a 72 y/o female s/p elective R TKA. PMH includes arthritis, L hip bursa excision, and L TKA.     PT Comments    Second session focused on activity tolerance and smoother gait pattern; questions solicited and answered; OK for dc home from PT standpoint    Follow Up Recommendations  Follow surgeon's recommendation for DC plan and follow-up therapies;Supervision for mobility/OOB     Equipment Recommendations  None recommended by PT    Recommendations for Other Services       Precautions / Restrictions Precautions Precautions: Knee Precaution Booklet Issued: Yes (comment) Precaution Comments: Reviewed supine ther ex and knee precautions with pt.  Restrictions Weight Bearing Restrictions: Yes RLE Weight Bearing: Weight bearing as tolerated    Mobility  Bed Mobility                  Transfers Overall transfer level: Needs assistance Equipment used: Rolling walker (2 wheeled) Transfers: Sit to/from Stand Sit to Stand: Supervision         General transfer comment: Cues to allow for knee flexion during transition to her tolerance  Ambulation/Gait Ambulation/Gait assistance: Supervision Ambulation Distance (Feet): 550 Feet Assistive device: Rolling walker (2 wheeled) Gait Pattern/deviations: Step-through pattern Gait velocity: Decreased    General Gait Details: Cues to activate quad and gluteals for fully upright posture in R stance; Cues to allow for free R knee flexion during swing phase   Stairs Stairs: Yes   Stair Management: One rail Right;Sideways;Step to pattern Number of Stairs: 12 General stair comments: Managing well; no need for cues for sequence  Wheelchair Mobility    Modified Rankin (Stroke Patients Only)       Balance                                            Cognition Arousal/Alertness: Awake/alert Behavior During Therapy: WFL for tasks assessed/performed Overall Cognitive Status: Within Functional Limits for tasks assessed                                        Exercises Total Joint Exercises Long Arc Quad: AROM;10 reps;Right Knee Flexion: AROM;Right;10 reps    General Comments        Pertinent Vitals/Pain Pain Assessment: 0-10 Pain Score: 3  Pain Location: R knee  Pain Descriptors / Indicators: Aching Pain Intervention(s): Monitored during session    Home Living                      Prior Function            PT Goals (current goals can now be found in the care plan section) Acute Rehab PT Goals Patient Stated Goal: to go home     Frequency    7X/week      PT Plan Current plan remains appropriate    Co-evaluation              AM-PAC PT "6 Clicks" Daily Activity  Outcome Measure  Difficulty turning over in bed (including adjusting bedclothes, sheets and blankets)?: None Difficulty moving from lying on back to sitting on the side of the  bed? : None Difficulty sitting down on and standing up from a chair with arms (e.g., wheelchair, bedside commode, etc,.)?: A Little Help needed moving to and from a bed to chair (including a wheelchair)?: None Help needed walking in hospital room?: None Help needed climbing 3-5 steps with a railing? : None 6 Click Score: 23    End of Session Equipment Utilized During Treatment: Gait belt Activity Tolerance: Patient tolerated treatment well Patient left: in chair;with call bell/phone within reach Nurse Communication: Mobility status PT Visit Diagnosis: Other abnormalities of gait and mobility (R26.89);Pain Pain - Right/Left: Right Pain - part of body: Knee     Time: 3354-5625 PT Time Calculation (min) (ACUTE ONLY): 23 min  Charges:  $Gait Training: 23-37 mins                    G Codes:       Roney Marion, [place] Pager [phone] Office [place] 01/28/2018, 1:48 PM

## 2018-01-28 NOTE — Evaluation (Signed)
Occupational Therapy Evaluation Patient Details Name: Erin Carroll MRN: [id number] DOB: 1946/11/26 Today's Date: 01/28/2018    History of Present Illness Pt is a 72 y/o female s/p elective R TKA. PMH includes arthritis, L hip bursa excision, and L TKA.    Clinical Impression   Patient evaluated by Occupational Therapy with no further acute OT needs identified. All education has been completed and the patient has no further questions. See below for any follow-up Occupational Therapy or equipment needs. OT to sign off. Thank you for referral.      Follow Up Recommendations  No OT follow up    Equipment Recommendations  None recommended by OT    Recommendations for Other Services       Precautions / Restrictions Precautions Precautions: Knee Precaution Booklet Issued: Yes (comment) Precaution Comments: Reviewed supine ther ex and knee precautions with pt.  Restrictions Weight Bearing Restrictions: Yes RLE Weight Bearing: Weight bearing as tolerated      Mobility Bed Mobility               General bed mobility comments: in chair on arrival  Transfers Overall transfer level: Modified independent Equipment used: Rolling walker (2 wheeled) Transfers: Sit to/from Stand Sit to Stand: Supervision         General transfer comment: Cues to allow for knee flexion during transition to her tolerance    Balance   Sitting-balance support: No upper extremity supported;Feet supported Sitting balance-Leahy Scale: Good                                     ADL either performed or assessed with clinical judgement   ADL Overall ADL's : Modified independent                                       General ADL Comments: pt educated with demo of shower transfer to enter backwards. educated on water temperature to decr fall risk, educated on dressing operative leg first. pt demonstrates and voids bladder on toilet  Educated patient on knee full  extension with return demonstration, educated tub/shower transfer,never to wash directly on incision site, always use fresh clean linen (one time use then place in laundry), avoid water under bandage and benefits of wrapping dressing, sleeping positioning, avoid putting pillow under knee,     Vision Baseline Vision/History: Wears glasses       Perception     Praxis      Pertinent Vitals/Pain Pain Assessment: 0-10 Pain Score: 3  Pain Location: R knee  Pain Descriptors / Indicators: Aching Pain Intervention(s): Monitored during session;Premedicated before session;Repositioned     Hand Dominance Right   Extremity/Trunk Assessment Upper Extremity Assessment Upper Extremity Assessment: Overall WFL for tasks assessed   Lower Extremity Assessment Lower Extremity Assessment: Defer to PT evaluation   Cervical / Trunk Assessment Cervical / Trunk Assessment: Normal   Communication Communication Communication: No difficulties   Cognition Arousal/Alertness: Awake/alert Behavior During Therapy: WFL for tasks assessed/performed Overall Cognitive Status: Within Functional Limits for tasks assessed                                     General Comments       Exercises Exercises: Total Joint Total Joint  Exercises Long Arc Quad: AROM;10 reps;Right Knee Flexion: AROM;Right;10 reps   Shoulder Instructions      Home Living Family/patient expects to be discharged to:: Private residence Living Arrangements: Spouse/significant other Available Help at Discharge: Family;Available 24 hours/day Type of Home: House Home Access: Stairs to enter [employer] of Steps: 2 Entrance Stairs-Rails: Right;Left Home Layout: Multi-level Alternate Level Stairs-Number of Steps: flight   Alternate Level Stairs-Rails: Right Bathroom Shower/Tub: Occupational psychologist: Handicapped height     Home Equipment: Clinical cytogeneticist - 2 wheels;Bedside commode;Cane -  single point;Grab bars - toilet;Grab bars - tub/shower   Additional Comments: Will be staying in basement. Has level entry to get to basement, however, would have to walk through the grass to get to that entry.       Prior Functioning/Environment Level of Independence: Independent                 OT Problem List:        OT Treatment/Interventions:      OT Goals(Current goals can be found in the care plan section) Acute Rehab OT Goals Patient Stated Goal: to go home   OT Frequency:     Barriers to D/C:            Co-evaluation              AM-PAC PT "6 Clicks" Daily Activity     Outcome Measure Help from another person eating meals?: None Help from another person taking care of personal grooming?: None Help from another person toileting, which includes using toliet, bedpan, or urinal?: None Help from another person bathing (including washing, rinsing, drying)?: None Help from another person to put on and taking off regular upper body clothing?: None Help from another person to put on and taking off regular lower body clothing?: None 6 Click Score: 24   End of Session Equipment Utilized During Treatment: Gait belt;Rolling walker CPM Right Knee CPM Right Knee: Off  Activity Tolerance: Patient tolerated treatment well Patient left: Other (comment)(d/c with transport for home)                   Time: 1341-1400 OT Time Calculation (min): 19 min Charges:  OT General Charges $OT Visit: 1 Visit OT Evaluation $OT Eval Moderate Complexity: 1 Mod G-Codes:      Jeri Modena   OTR/L Pager: [phone] Office: [phone] .   Parke Poisson B 01/28/2018, 4:16 PM

## 2018-01-28 NOTE — Care Management Note (Signed)
Case Management Note  Patient Details  Name: Erin Carroll MRN: [id number] Date of Birth: Aug 13, 1946  Subjective/Objective:   72 yr old female s/p right total knee arthroplasty.                 Action/Plan: Patient was preoperatively setup with [facility], no changes. She will have support at discharge.   Expected Discharge Date:  01/28/18               Expected Discharge Plan:  [place]  In-House Referral:  NA  Discharge planning Services  CM Consult  Post Acute Care Choice:  Durable Medical Equipment, Home Health Choice offered to:  Patient  DME Arranged:  3-N-1, CPM, Walker rolling DME Agency:  [employer]  HH Arranged:  PT [place]:  [facility] [employer]  Status of Service:  Completed, signed off  If discussed at [employer] [facility] [employer], dates discussed:    Additional Comments:  Ninfa Meeker, RN 01/28/2018, 12:34 PM

## 2018-01-28 NOTE — Progress Notes (Signed)
SPORTS MEDICINE AND JOINT REPLACEMENT  Erin Mulch, MD    Carlyon Shadow, PA-C [name], [place], [name]  [address]                             [phone]   PROGRESS NOTE  Subjective:  negative for Chest Pain  negative for Shortness of Breath  negative for Nausea/Vomiting   negative for Calf Pain  negative for Bowel Movement   Tolerating Diet: yes         Patient reports pain as 3 on 0-10 scale.    Objective: Vital signs in last 24 hours:    Patient Vitals for the past 24 hrs:  BP Temp Temp src Pulse Resp SpO2  01/28/18 0427 130/82 98.5 F (36.9 C) Oral 68 17 96 %  01/28/18 0024 105/72 98.5 F (36.9 C) Oral (!) 59 16 96 %  01/27/18 2032 125/74 98 F (36.7 C) Oral 64 16 98 %  01/27/18 1126 107/71 (!) 97.5 F (36.4 C) Oral (!) 58 15 98 %  01/27/18 1112 - (!) 97.3 F (36.3 C) - - - -  01/27/18 1102 119/68 - - (!) 53 13 96 %  01/27/18 1100 - - - (!) 52 14 97 %  01/27/18 1047 110/72 - - (!) 53 16 98 %  01/27/18 1045 - - - (!) 56 16 97 %  01/27/18 1032 101/64 - - (!) 55 15 97 %  01/27/18 1030 - - - (!) 53 14 96 %  01/27/18 1017 106/64 - - (!) 52 (!) 23 96 %  01/27/18 1015 - - - (!) 54 12 96 %  01/27/18 1002 97/66 - - (!) 53 13 95 %  01/27/18 1000 - - - (!) 53 17 97 %  01/27/18 0947 94/61 - - (!) 56 13 96 %  01/27/18 0945 - - - (!) 53 15 96 %  01/27/18 0933 (!) 95/57 - - (!) 54 13 98 %  01/27/18 0932 (!) 95/57 - - (!) 58 13 98 %  01/27/18 0930 - - - (!) 56 13 98 %  01/27/18 0918 92/60 97.6 F (36.4 C) - (!) 55 15 100 %    @flow {1959:LAST@   Intake/Output from previous day:   02/25 0701 - 02/26 0700 In: 91478 [P.O.:12315; I.V.:1100] Out: 450 [Urine:400]   Intake/Output this shift:   No intake/output data recorded.   Intake/Output      02/25 0701 - 02/26 0700 02/26 0701 - 02/27 0700   P.O. 12315    I.V. (mL/kg) 1100 (21.4)    Other 255    Total Intake(mL/kg) 13670 (265.4)    Urine (mL/kg/hr) 400 (0.3)    Blood 50    Total Output 450    Net  +13220         Urine Occurrence 5 x       LABORATORY DATA: Recent Labs    01/28/18 0609  WBC 9.7  HGB 10.2*  HCT 30.8*  PLT 214   No results for input(s): NA, K, CL, CO2, BUN, CREATININE, GLUCOSE, CALCIUM in the last 168 hours. Lab Results  Component Value Date   INR 1.01 01/03/2015    Examination:    Wound Exam: clean, dry, intact   Drainage:  None: wound tissue dry  Motor Exam: Quadriceps and Hamstrings Intact  Sensory Exam: Superficial Peroneal, Deep Peroneal and Tibial normal   Assessment:    1 Day Post-Op  Procedure(s) (LRB): TOTAL KNEE ARTHROPLASTY (Right)  ADDITIONAL DIAGNOSIS:  Active Problems:   S/P total knee replacement     Plan: Physical Therapy as ordered Weight Bearing as Tolerated (WBAT)  DVT Prophylaxis:  Aspirin  DISCHARGE PLAN: Home  DISCHARGE NEEDS: HHPT   Patient progressing well, expected D/C home today         Donia Ast 01/28/2018, 7:07 AM

## 2018-01-28 NOTE — Discharge Summary (Signed)
SPORTS MEDICINE & JOINT REPLACEMENT   Lara Mulch, MD   Carlyon Shadow, PA-C [place], [place], [place]  [address]                             [phone]  PATIENT ID: Erin Carroll        MRN:  [id number]          DOB/AGE: Jul 31, 1946 / 72 y.o.    DISCHARGE SUMMARY  ADMISSION DATE:    01/27/2018 DISCHARGE DATE:   01/28/2018   ADMISSION DIAGNOSIS: primary osteoarthritis right knee    DISCHARGE DIAGNOSIS:  primary osteoarthritis right knee    ADDITIONAL DIAGNOSIS: Active Problems:   S/P total knee replacement  Past Medical History:  Diagnosis Date  . Alcoholism /alcohol abuse ([place])    sober since 47  . Arthritis    "knees, hands, fingers" (01/27/2018)  . Hypothyroidism   . Migraine 1979  . Thyroid disease    problems with thyroid    PROCEDURE: Procedure(s): TOTAL KNEE ARTHROPLASTY on 01/27/2018  CONSULTS:    HISTORY:  See H&P in chart  HOSPITAL COURSE:  Erin Carroll is a 72 y.o. admitted on 01/27/2018 and found to have a diagnosis of primary osteoarthritis right knee.  After appropriate laboratory studies were obtained  they were taken to the operating room on 01/27/2018 and underwent Procedure(s): TOTAL KNEE ARTHROPLASTY.   They were given perioperative antibiotics:  Anti-infectives (From admission, onward)   Start     Dose/Rate Route Frequency Ordered Stop   01/27/18 1330  ceFAZolin (ANCEF) IVPB 1 g/50 mL premix     1 g 100 mL/hr over 30 Minutes Intravenous Every 6 hours 01/27/18 1127 01/27/18 2121   01/27/18 0551  ceFAZolin (ANCEF) 2-4 GM/100ML-% IVPB    Comments:  Block, Sarah   : cabinet override      01/27/18 0551 01/27/18 0736   01/27/18 0542  ceFAZolin (ANCEF) IVPB 2g/100 mL premix     2 g 200 mL/hr over 30 Minutes Intravenous On call to O.R. 01/27/18 7035 01/27/18 0746    .  Patient given tranexamic acid IV or topical and exparel intra-operatively.  Tolerated the procedure well.    POD# 1: Vital signs were stable.  Patient denied  Chest pain, shortness of breath, or calf pain.  Patient was started on Lovenox 30 mg subcutaneously twice daily at 8am.  Consults to PT, OT, and care management were made.  The patient was weight bearing as tolerated.  CPM was placed on the operative leg 0-90 degrees for 6-8 hours a day. When out of the CPM, patient was placed in the foam block to achieve full extension. Incentive spirometry was taught.  Dressing was changed.       POD #2, Continued  PT for ambulation and exercise program.  IV saline locked.  O2 discontinued.    The remainder of the hospital course was dedicated to ambulation and strengthening.   The patient was discharged on 1 Day Post-Op in  Good condition.  Blood products given:none  DIAGNOSTIC STUDIES: Recent vital signs:  Patient Vitals for the past 24 hrs:  BP Temp Temp src Pulse Resp SpO2  01/28/18 0427 130/82 98.5 F (36.9 C) Oral 68 17 96 %  01/28/18 0024 105/72 98.5 F (36.9 C) Oral (!) 59 16 96 %  01/27/18 2032 125/74 98 F (36.7 C) Oral 64 16 98 %       Recent  laboratory studies: Recent Labs    01/28/18 0609  WBC 9.7  HGB 10.2*  HCT 30.8*  PLT 214   Recent Labs    01/28/18 0609  NA 141  K 3.5  CL 107  CO2 26  BUN 12  CREATININE 0.78  GLUCOSE 92  CALCIUM 8.9   Lab Results  Component Value Date   INR 1.01 01/03/2015     Recent Radiographic Studies :  No results found.  DISCHARGE INSTRUCTIONS: Discharge Instructions    CPM   Complete by:  As directed    Continuous passive motion machine (CPM):      Use the CPM from 0 to 90 for 4-6 hours per day.      You may increase by 10 per day.  You may break it up into 2 or 3 sessions per day.      Use CPM for 2 weeks or until you are told to stop.   Call MD / Call 911   Complete by:  As directed    If you experience chest pain or shortness of breath, CALL 911 and be transported to the hospital emergency room.  If you develope a fever above 101 F, pus (white drainage) or increased drainage or  redness at the wound, or calf pain, call your surgeon's office.   Constipation Prevention   Complete by:  As directed    Drink plenty of fluids.  Prune juice may be helpful.  You may use a stool softener, such as Colace (over the counter) 100 mg twice a day.  Use MiraLax (over the counter) for constipation as needed.   Diet - low sodium heart healthy   Complete by:  As directed    Discharge instructions   Complete by:  As directed    INSTRUCTIONS AFTER JOINT REPLACEMENT   Remove items at home which could result in a fall. This includes throw rugs or furniture in walking pathways ICE to the affected joint every three hours while awake for 30 minutes at a time, for at least the first 3-5 days, and then as needed for pain and swelling.  Continue to use ice for pain and swelling. You may notice swelling that will progress down to the foot and ankle.  This is normal after surgery.  Elevate your leg when you are not up walking on it.   Continue to use the breathing machine you got in the hospital (incentive spirometer) which will help keep your temperature down.  It is common for your temperature to cycle up and down following surgery, especially at night when you are not up moving around and exerting yourself.  The breathing machine keeps your lungs expanded and your temperature down.   DIET:  As you were doing prior to hospitalization, we recommend a well-balanced diet.  DRESSING / WOUND CARE / SHOWERING  Keep the surgical dressing until follow up.  The dressing is water proof, so you can shower without any extra covering.  IF THE DRESSING FALLS OFF or the wound gets wet inside, change the dressing with sterile gauze.  Please use good hand washing techniques before changing the dressing.  Do not use any lotions or creams on the incision until instructed by your surgeon.    ACTIVITY  Increase activity slowly as tolerated, but follow the weight bearing instructions below.   No driving for 6 weeks  or until further direction given by your physician.  You cannot drive while taking narcotics.  No lifting or carrying  greater than 10 lbs. until further directed by your surgeon. Avoid periods of inactivity such as sitting longer than an hour when not asleep. This helps prevent blood clots.  You may return to work once you are authorized by your doctor.     WEIGHT BEARING   Weight bearing as tolerated with assist device (walker, cane, etc) as directed, use it as long as suggested by your surgeon or therapist, typically at least 4-6 weeks.   EXERCISES  Results after joint replacement surgery are often greatly improved when you follow the exercise, range of motion and muscle strengthening exercises prescribed by your doctor. Safety measures are also important to protect the joint from further injury. Any time any of these exercises cause you to have increased pain or swelling, decrease what you are doing until you are comfortable again and then slowly increase them. If you have problems or questions, call your caregiver or physical therapist for advice.   Rehabilitation is important following a joint replacement. After just a few days of immobilization, the muscles of the leg can become weakened and shrink (atrophy).  These exercises are designed to build up the tone and strength of the thigh and leg muscles and to improve motion. Often times heat used for twenty to thirty minutes before working out will loosen up your tissues and help with improving the range of motion but do not use heat for the first two weeks following surgery (sometimes heat can increase post-operative swelling).   These exercises can be done on a training (exercise) mat, on the floor, on a table or on a bed. Use whatever works the best and is most comfortable for you.    Use music or television while you are exercising so that the exercises are a pleasant break in your day. This will make your life better with the exercises  acting as a break in your routine that you can look forward to.   Perform all exercises about fifteen times, three times per day or as directed.  You should exercise both the operative leg and the other leg as well.   Exercises include:   Quad Sets - Tighten up the muscle on the front of the thigh (Quad) and hold for 5-10 seconds.   Straight Leg Raises - With your knee straight (if you were given a brace, keep it on), lift the leg to 60 degrees, hold for 3 seconds, and slowly lower the leg.  Perform this exercise against resistance later as your leg gets stronger.  Leg Slides: Lying on your back, slowly slide your foot toward your buttocks, bending your knee up off the floor (only go as far as is comfortable). Then slowly slide your foot back down until your leg is flat on the floor again.  Angel Wings: Lying on your back spread your legs to the side as far apart as you can without causing discomfort.  Hamstring Strength:  Lying on your back, push your heel against the floor with your leg straight by tightening up the muscles of your buttocks.  Repeat, but this time bend your knee to a comfortable angle, and push your heel against the floor.  You may put a pillow under the heel to make it more comfortable if necessary.   A rehabilitation program following joint replacement surgery can speed recovery and prevent re-injury in the future due to weakened muscles. Contact your doctor or a physical therapist for more information on knee rehabilitation.    CONSTIPATION  Constipation  is defined medically as fewer than three stools per week and severe constipation as less than one stool per week.  Even if you have a regular bowel pattern at home, your normal regimen is likely to be disrupted due to multiple reasons following surgery.  Combination of anesthesia, postoperative narcotics, change in appetite and fluid intake all can affect your bowels.   YOU MUST use at least one of the following options; they  are listed in order of increasing strength to get the job done.  They are all available over the counter, and you may need to use some, POSSIBLY even all of these options:    Drink plenty of fluids (prune juice may be helpful) and high fiber foods Colace 100 mg by mouth twice a day  Senokot for constipation as directed and as needed Dulcolax (bisacodyl), take with full glass of water  Miralax (polyethylene glycol) once or twice a day as needed.  If you have tried all these things and are unable to have a bowel movement in the first 3-4 days after surgery call either your surgeon or your primary doctor.    If you experience loose stools or diarrhea, hold the medications until you stool forms back up.  If your symptoms do not get better within 1 week or if they get worse, check with your doctor.  If you experience "the worst abdominal pain ever" or develop nausea or vomiting, please contact the office immediately for further recommendations for treatment.   ITCHING:  If you experience itching with your medications, try taking only a single pain pill, or even half a pain pill at a time.  You can also use Benadryl over the counter for itching or also to help with sleep.   TED HOSE STOCKINGS:  Use stockings on both legs until for at least 2 weeks or as directed by physician office. They may be removed at night for sleeping.  MEDICATIONS:  See your medication summary on the "After Visit Summary" that nursing will review with you.  You may have some home medications which will be placed on hold until you complete the course of blood thinner medication.  It is important for you to complete the blood thinner medication as prescribed.  PRECAUTIONS:  If you experience chest pain or shortness of breath - call 911 immediately for transfer to the hospital emergency department.   If you develop a fever greater that 101 F, purulent drainage from wound, increased redness or drainage from wound, foul odor from the  wound/dressing, or calf pain - CONTACT YOUR SURGEON.                                                   FOLLOW-UP APPOINTMENTS:  If you do not already have a post-op appointment, please call the office for an appointment to be seen by your surgeon.  Guidelines for how soon to be seen are listed in your "After Visit Summary", but are typically between 1-4 weeks after surgery.  OTHER INSTRUCTIONS:   Knee Replacement:  Do not place pillow under knee, focus on keeping the knee straight while resting. CPM instructions: 0-90 degrees, 2 hours in the morning, 2 hours in the afternoon, and 2 hours in the evening. Place foam block, curve side up under heel at all times except when in CPM or when walking.  DO NOT modify, tear, cut, or change the foam block in any way.  MAKE SURE YOU:  Understand these instructions.  Get help right away if you are not doing well or get worse.    Thank you for letting us be a part of your medical care team.  It is a privilege we respect greatly.  We hope these instructions will help you stay on track for a fast and full recovery!   Increase activity slowly as tolerated   Complete by:  As directed       DISCHARGE MEDICATIONS:   Allergies as of 01/28/2018   No Known Allergies     Medication List    STOP taking these medications   aspirin-acetaminophen-caffeine 250-250-65 MG tablet Commonly known as:  EXCEDRIN MIGRAINE   doxycycline 100 MG capsule Commonly known as:  VIBRAMYCIN     TAKE these medications   aspirin 325 MG EC tablet Take 1 tablet (325 mg total) by mouth 2 (two) times daily.   Biotin 5000 MCG Caps Take 5,000 mcg by mouth daily.   celecoxib 200 MG capsule Commonly known as:  CELEBREX Take 200 mg by mouth daily.   chlorpheniramine 4 MG tablet Commonly known as:  CHLOR-TRIMETON Take 2 mg by mouth 2 (two) times daily as needed for allergies.   gabapentin 300 MG capsule Commonly known as:  NEURONTIN Take 1 capsule (300 mg total) by mouth 3  (three) times daily as needed.   levothyroxine 75 MCG tablet Commonly known as:  SYNTHROID, LEVOTHROID Take 1 tablet (75 mcg total) by mouth daily before breakfast. What changed:  additional instructions   methocarbamol 500 MG tablet Commonly known as:  ROBAXIN Take 1-2 tablets (500-1,000 mg total) by mouth every 6 (six) hours as needed for muscle spasms.   multivitamin tablet Take 1 tablet by mouth daily.   Oxycodone HCl 10 MG Tabs Take 1 tablet (10 mg total) by mouth every 4 (four) hours as needed for moderate pain ((score 4 to 6)).   phenylephrine 10 MG Tabs tablet Commonly known as:  SUDAFED PE Take 10 mg by mouth 2 (two) times daily as needed (allergies).   pyridOXINE 100 MG tablet Commonly known as:  VITAMIN B-6 Take 100 mg by mouth daily.       FOLLOW UP VISIT:    DISPOSITION: HOME VS. SNF  CONDITION:  Good   Donia Ast 01/28/2018, 12:07 PM

## 2018-01-28 NOTE — Care Management Obs Status (Signed)
MEDICARE OBSERVATION STATUS NOTIFICATION   Patient Details  Name: Erin Carroll MRN: [id number] Date of Birth: 1946/02/21   Medicare Observation Status Notification Given:  Yes    Carles Collet, RN 01/28/2018, 10:00 AM

## 2018-01-28 NOTE — Progress Notes (Signed)
Physical Therapy Treatment Patient Details Name: Erin Carroll MRN: [id number] DOB: August 10, 1946 Today's Date: 01/28/2018    History of Present Illness Pt is a 72 y/o female s/p elective R TKA. PMH includes arthritis, L hip bursa excision, and L TKA.     PT Comments    Continuing work on functional mobility and activity tolerance;  Excellent progress towards goals; covered stair training, car transfers, initial therex; Nice, stable knee in stance; OK for dc home from PT standpoint -- Rn notified   Follow Up Recommendations  Follow surgeon's recommendation for DC plan and follow-up therapies;Supervision for mobility/OOB     Equipment Recommendations  None recommended by PT    Recommendations for Other Services       Precautions / Restrictions Precautions Precautions: Knee Precaution Booklet Issued: Yes (comment) Precaution Comments: Reviewed supine ther ex and knee precautions with pt.  Restrictions RLE Weight Bearing: Weight bearing as tolerated    Mobility  Bed Mobility Overal bed mobility: Needs Assistance Bed Mobility: Sit to Supine       Sit to supine: Supervision   General bed mobility comments: Cues for technqiue  Transfers Overall transfer level: Needs assistance Equipment used: Rolling walker (2 wheeled) Transfers: Sit to/from Stand Sit to Stand: Supervision         General transfer comment: Cues to allow for knee flexion during transition to her tolerance  Ambulation/Gait Ambulation/Gait assistance: Supervision Ambulation Distance (Feet): 250 Feet Assistive device: Rolling walker (2 wheeled) Gait Pattern/deviations: Step-through pattern Gait velocity: Decreased    General Gait Details: Cues to activate quad and gluteals for stability and fully upright posture in R stance   Stairs Stairs: Yes   Stair Management: One rail Right;Sideways;Step to pattern Number of Stairs: 12 General stair comments: verbal and demo cues for sequence; managing  well  Wheelchair Mobility    Modified Rankin (Stroke Patients Only)       Balance             Standing balance-Leahy Scale: Fair                              Cognition Arousal/Alertness: Awake/alert Behavior During Therapy: WFL for tasks assessed/performed Overall Cognitive Status: Within Functional Limits for tasks assessed                                        Exercises Total Joint Exercises Quad Sets: AROM;Right;10 reps Short Arc Quad: AROM;Right;10 reps Heel Slides: AROM;Right;10 reps Hip ABduction/ADduction: AROM;Right;10 reps Straight Leg Raises: AROM;Right;10 reps Long Arc Quad: AROM;Right;5 reps Knee Flexion: AROM;AAROM;5 reps;Seated(self-AAROM with LLE assisting R) Goniometric ROM: approx 1-90 deg    General Comments General comments (skin integrity, edema, etc.): We discussed car transfers      Pertinent Vitals/Pain Pain Assessment: 0-10 Pain Score: 4  Pain Location: R knee  Pain Descriptors / Indicators: Aching;Operative site guarding Pain Intervention(s): Monitored during session    Home Living                      Prior Function            PT Goals (current goals can now be found in the care plan section) Acute Rehab PT Goals Patient Stated Goal: to go home  PT Goal Formulation: With patient Time For Goal Achievement: 02/10/18 Potential to Achieve Goals:  Good Progress towards PT goals: Progressing toward goals    Frequency    7X/week      PT Plan Current plan remains appropriate    Co-evaluation              AM-PAC PT "6 Clicks" Daily Activity  Outcome Measure  Difficulty turning over in bed (including adjusting bedclothes, sheets and blankets)?: A Little Difficulty moving from lying on back to sitting on the side of the bed? : None Difficulty sitting down on and standing up from a chair with arms (e.g., wheelchair, bedside commode, etc,.)?: A Little Help needed moving to and from  a bed to chair (including a wheelchair)?: None Help needed walking in hospital room?: None Help needed climbing 3-5 steps with a railing? : None 6 Click Score: 22    End of Session Equipment Utilized During Treatment: Gait belt Activity Tolerance: Patient tolerated treatment well Patient left: in chair;with call bell/phone within reach Nurse Communication: Mobility status PT Visit Diagnosis: Other abnormalities of gait and mobility (R26.89);Pain Pain - Right/Left: Right Pain - part of body: Knee     Time: 0827-0905 PT Time Calculation (min) (ACUTE ONLY): 38 min  Charges:  $Gait Training: 23-37 mins $Therapeutic Exercise: 8-22 mins                    G Codes:       Roney Marion, PT  Acute Rehabilitation Services Pager [phone] Office [phone]    [name] 01/28/2018, 9:24 AM

## 2018-01-28 NOTE — Progress Notes (Signed)
Patient was provided with written and verbal discharge instructions.  Patient verbalizes understanding the instructions.  Patient reports that she has all equipment needed at the home

## 2018-02-07 ENCOUNTER — Ambulatory Visit: Payer: Medicare Other | Attending: Orthopedic Surgery | Admitting: Physical Therapy

## 2018-02-07 ENCOUNTER — Encounter: Payer: Self-pay | Admitting: Physical Therapy

## 2018-02-07 ENCOUNTER — Other Ambulatory Visit: Payer: Self-pay

## 2018-02-07 DIAGNOSIS — M6281 Muscle weakness (generalized): Secondary | ICD-10-CM | POA: Insufficient documentation

## 2018-02-07 DIAGNOSIS — R278 Other lack of coordination: Secondary | ICD-10-CM | POA: Diagnosis present

## 2018-02-07 DIAGNOSIS — M25561 Pain in right knee: Secondary | ICD-10-CM | POA: Diagnosis present

## 2018-02-07 DIAGNOSIS — M7989 Other specified soft tissue disorders: Secondary | ICD-10-CM | POA: Insufficient documentation

## 2018-02-07 DIAGNOSIS — M25551 Pain in right hip: Secondary | ICD-10-CM | POA: Insufficient documentation

## 2018-02-07 DIAGNOSIS — R600 Localized edema: Secondary | ICD-10-CM

## 2018-02-07 DIAGNOSIS — G8929 Other chronic pain: Secondary | ICD-10-CM | POA: Diagnosis present

## 2018-02-07 DIAGNOSIS — M25661 Stiffness of right knee, not elsewhere classified: Secondary | ICD-10-CM

## 2018-02-07 DIAGNOSIS — R262 Difficulty in walking, not elsewhere classified: Secondary | ICD-10-CM | POA: Diagnosis present

## 2018-02-07 NOTE — Therapy (Signed)
[place], [place], [address] Phone: [phone]   Fax:  [phone]  Physical Therapy Evaluation  Patient Details  Name: Erin Carroll MRN: [id number] Date of Birth: 1946/04/19 Referring Provider: Ronnie Derby   Encounter Date: 02/07/2018  PT End of Session - 02/07/18 1055    Visit Number  1    Date for PT Re-Evaluation  04/09/18    PT Start Time  2423    PT Stop Time  1111    PT Time Calculation (min)  56 min    Activity Tolerance  Patient tolerated treatment well    Behavior During Therapy  [facility] for tasks assessed/performed       Past Medical History:  Diagnosis Date  . Alcoholism /alcohol abuse ([place])    sober since 76  . Arthritis    "knees, hands, fingers" (01/27/2018)  . Hypothyroidism   . Migraine 1979  . Thyroid disease    problems with thyroid    Past Surgical History:  Procedure Laterality Date  . BREAST BIOPSY Left 1977  . COLONOSCOPY    . EXCISION/RELEASE BURSA HIP Left 01/08/2014   Procedure: LEFT HIP BURSECTOMY WITH GLUTEAL TENDON REPAIR;  Surgeon: [name], MD;  Location: [facility];  Service: Orthopedics;  Laterality: Left;  With Anchors  . FINGER SURGERY Left 12-31-13   left middle finger cyst -aspirated  . JOINT REPLACEMENT    . KNEE ARTHROSCOPY Right 1987; 1991; 10/2010  . METACARPOPHALANGEAL JOINT ARTHROPLASTY Bilateral 2013   bilateral thumbs- Gramig  . RECTAL PROLAPSE REPAIR  MAY 2015  . SHOULDER ARTHROSCOPY Bilateral 2004   "? side; cleanout"  . TONSILLECTOMY    . TOTAL KNEE ARTHROPLASTY Left 01/10/2015   Procedure: LEFT TOTAL KNEE ARTHROPLASTY;  Surgeon: [name], MD;  [facility];  Service: Orthopedics;  Laterality: Left;  . TOTAL KNEE ARTHROPLASTY Right 01/27/2018  . TOTAL KNEE ARTHROPLASTY Right 01/27/2018   Procedure: TOTAL KNEE ARTHROPLASTY;  Surgeon: Vickey Huger, MD;  Location: [place];  Service: Orthopedics;  Laterality: Right;  . TUBAL  LIGATION  1977    There were no vitals filed for this visit.   Subjective Assessment - 02/07/18 1019    Subjective  Patient reports that she had been having right knee pain for a number of years.  She had 3 knee scopes on the right knee as well as Synvysc injections.  She underwent a right TKR on 01/27/18.  She had an overnight hospital stay and then Home PT until Tuesday.    Limitations  Walking;Standing;House hold activities    Patient Stated Goals  walk normally, have better ROM and less pain    Currently in Pain?  Yes    Pain Score  2     Pain Location  Knee    Pain Orientation  Right    Pain Descriptors / Indicators  Aching;Tightness;Sore    Pain Type  Acute pain;Surgical pain    Pain Onset  1 to 4 weeks ago    Pain Frequency  Intermittent    Aggravating Factors   bending, walking pain is up to 7/10    Pain Relieving Factors  ice, rest and pain meds, at best pain is a 2/10    Effect of Pain on Daily Activities  limits walking, and rest and I hurt         [facility] PT Assessment - 02/07/18 0001      Assessment   Medical  Diagnosis  right TKR    Referring Provider  [place]    Onset Date/Surgical Date  01/27/18    Prior Therapy  home PT      Precautions   Precautions  None      Balance Screen   Has the patient fallen in the past 6 months  No    Has the patient had a decrease in activity level because of a fear of falling?   No    Is the patient reluctant to leave their home because of a fear of falling?   No      Home Environment   Additional Comments  hsa stairs, does housework and yardowrk, likes to garden      Prior Function   Level of Independence  Independent    Vocation  Part time employment    Vocation Requirements  on feet 6 hours 1 day a week    Leisure  golf 3x/week, some fitness classes      Observation/Other Assessments-Edema    Edema  Circumferential      Circumferential Edema   Circumferential - Right  39.5cm    Circumferential - Left   37.5      ROM /  Strength   AROM / PROM / Strength  AROM;PROM;Strength      AROM   AROM Assessment Site  Knee    Right/Left Knee  Right    Right Knee Extension  20    Right Knee Flexion  90      PROM   PROM Assessment Site  Knee    Right/Left Knee  Right    Right Knee Extension  10    Right Knee Flexion  96      Strength   Overall Strength Comments  4-/5 with mild pain      Flexibility   Soft Tissue Assessment /Muscle Length  -- some calf and HS tightness      Palpation   Palpation comment  tender superior patella and medial knee, no warmth, steristrips in place      Ambulation/Gait   Gait Comments  no device, but very stiff legged, does not bend the knee, small hip hike             Objective measurements completed on examination: See above findings.      [facility] Adult PT Treatment/Exercise - 02/07/18 0001      Modalities   Modalities  Vasopneumatic      Vasopneumatic   Number Minutes Vasopneumatic   15 minutes    Vasopnuematic Location   Knee    Vasopneumatic Pressure  Medium    Vasopneumatic Temperature   34               PT Short Term Goals - 02/07/18 1104      PT SHORT TERM GOAL #1   Title  independent with initial HEP    Time  2    Period  Weeks    Status  New        PT Long Term Goals - 02/07/18 1104      PT LONG TERM GOAL #1   Title  independent with  HEP and understand how to progress herself    Time  8    Period  Weeks    Status  New      PT LONG TERM GOAL #2   Title  increase strength of the right knee to 4+/5    Time  8  Period  Weeks    Status  New      PT LONG TERM GOAL #3   Title  walk without deviation all distances    Time  8    Period  Weeks    Status  New      PT LONG TERM GOAL #4   Title  increase ROM of the right knee to 0-120 degrees flexion    Time  8    Period  Weeks    Status  New      PT LONG TERM GOAL #5   Title  go up and down stairs reciprocally    Time  8    Period  Weeks    Status  New              Plan - 02/07/18 1056    Clinical Impression Statement  Patient has a hx of right knee problems, has had 3 scopes and w rounds of Synvysc injections.  She underwent a right TKR on 01/27/18.  Had home PT until Tuesday.  Her AROM was 20-90 degrees flexion, however her gait was very stiff legged on the right with a hip hike.  Has 2 cm edema in the right compared to the left.  She does report some left knee pain, had a left TKR in 2016, she reports that she does have left knee pain at times.    Clinical Presentation  Evolving    Clinical Decision Making  Low    Rehab Potential  Good    PT Frequency  2x / week    PT Duration  8 weeks    PT Treatment/Interventions  ADLs/Self Care Home Management;Cryotherapy;Electrical Stimulation;Gait training;Stair training;Functional mobility training;Therapeutic activities;Therapeutic exercise;Balance training;Neuromuscular re-education;Manual techniques;Patient/family education;Vasopneumatic Device    PT Next Visit Plan  continue to work on gait, ROM and strength    Consulted and Agree with Plan of Care  Patient       Patient will benefit from skilled therapeutic intervention in order to improve the following deficits and impairments:  Abnormal gait, Decreased activity tolerance, Decreased balance, Decreased mobility, Decreased strength, Increased edema, Impaired flexibility, Pain, Increased muscle spasms, Difficulty walking, Decreased range of motion  Visit Diagnosis: Acute pain of right knee - Plan: PT plan of care cert/re-cert  Stiffness of right knee, not elsewhere classified - Plan: PT plan of care cert/re-cert  Difficulty in walking, not elsewhere classified - Plan: PT plan of care cert/re-cert  Localized edema - Plan: PT plan of care cert/re-cert     Problem List Patient Active Problem List   Diagnosis Date Noted  . S/P total knee replacement 01/27/2018  . Abnormal ECG 05/03/2015  . 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 OA (osteoarthritis) of knee 01/10/2015  . Arthritis of knee, degenerative 01/10/2015  . Bursitis, hip 01/08/2014  . Ecchymosis 12/29/2013  . Annual physical exam 11/14/2012  . FECAL INCONTINENCE 03/14/2010  . PERSONAL HX COLONIC POLYPS 03/14/2010  . ARTHRITIS, HANDS, BILATERAL 11/15/2009  . ARTHRITIS, KNEES, BILATERAL 12/07/2008  . HYPOTHYROIDISM 09/28/2008  . 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 OSTEOPENIA 09/28/2008    Sumner Boast., PT 02/07/2018, 11:16 AM  [place], [place], [address] Phone: [phone]   Fax:  [phone]  Name: KIASIA CHOU MRN: [id number] Date of Birth: 1946/10/30

## 2018-02-11 ENCOUNTER — Ambulatory Visit: Payer: Medicare Other | Admitting: Physical Therapy

## 2018-02-11 DIAGNOSIS — M25661 Stiffness of right knee, not elsewhere classified: Secondary | ICD-10-CM

## 2018-02-11 DIAGNOSIS — M25561 Pain in right knee: Secondary | ICD-10-CM | POA: Diagnosis not present

## 2018-02-11 DIAGNOSIS — R262 Difficulty in walking, not elsewhere classified: Secondary | ICD-10-CM

## 2018-02-11 DIAGNOSIS — R600 Localized edema: Secondary | ICD-10-CM

## 2018-02-11 DIAGNOSIS — M6281 Muscle weakness (generalized): Secondary | ICD-10-CM

## 2018-02-11 NOTE — Therapy (Signed)
[facility] [place], [place], [address] Phone: [phone]   Fax:  [phone]  Physical Therapy Treatment  Patient Details  Name: Erin Carroll MRN: [id number] Date of Birth: October 30, 1946 Referring Provider: Ronnie Derby   Encounter Date: 02/11/2018  PT End of Session - 02/11/18 1355    PT Start Time  1305    PT Stop Time  1400    PT Time Calculation (min)  55 min       Past Medical History:  Diagnosis Date  . Alcoholism /alcohol abuse ([place])    sober since 59  . Arthritis    "knees, hands, fingers" (01/27/2018)  . Hypothyroidism   . Migraine 1979  . Thyroid disease    problems with thyroid    Past Surgical History:  Procedure Laterality Date  . BREAST BIOPSY Left 1977  . COLONOSCOPY    . EXCISION/RELEASE BURSA HIP Left 01/08/2014   Procedure: LEFT HIP BURSECTOMY WITH GLUTEAL TENDON REPAIR;  Surgeon: [name], MD;  [facility];  Service: Orthopedics;  Laterality: Left;  With Anchors  . FINGER SURGERY Left 12-31-13   left middle finger cyst -aspirated  . JOINT REPLACEMENT    . KNEE ARTHROSCOPY Right 1987; 1991; 10/2010  . METACARPOPHALANGEAL JOINT ARTHROPLASTY Bilateral 2013   bilateral thumbs- Gramig  . RECTAL PROLAPSE REPAIR  MAY 2015  . SHOULDER ARTHROSCOPY Bilateral 2004   "? side; cleanout"  . TONSILLECTOMY    . TOTAL KNEE ARTHROPLASTY Left 01/10/2015   Procedure: LEFT TOTAL KNEE ARTHROPLASTY;  Surgeon: [name], MD;  [facility];  Service: Orthopedics;  Laterality: Left;  . TOTAL KNEE ARTHROPLASTY Right 01/27/2018  . TOTAL KNEE ARTHROPLASTY Right 01/27/2018   Procedure: TOTAL KNEE ARTHROPLASTY;  Surgeon: Vickey Huger, MD;  Location: [place];  Service: Orthopedics;  Laterality: Right;  . TUBAL LIGATION  1977    There were no vitals filed for this visit.  Subjective Assessment - 02/11/18 1312    Subjective  stiff but doing well. doing HEP from [facility]    Currently in Pain?   Yes    Pain Score  2     Pain Location  Knee    Pain Orientation  Right         [facility] PT Assessment - 02/11/18 0001      AROM   AROM Assessment Site  Knee    Right/Left Knee  Right    Right Knee Extension  5    Right Knee Flexion  102                  [facility] Adult PT Treatment/Exercise - 02/11/18 0001      Ambulation/Gait   Gait Comments  worked on knee flex with swing phase of gait      Exercises   Exercises  Knee/Hip      Knee/Hip Exercises: Aerobic   Recumbent Bike  5 min    Nustep  L 2 6 min      Knee/Hip Exercises: Seated   Long Arc Quad  Strengthening;Right;2 sets;10 reps;Weights    Long Arc Quad Weight  2 lbs.    Other Seated Knee/Hip Exercises  TKE red tband 2 sets 10    Hamstring Curl  Strengthening;Right;2 sets;10 reps red tband      Modalities   Modalities  Vasopneumatic      Vasopneumatic   Number Minutes Vasopneumatic   15 minutes    Vasopnuematic Location  Knee    Vasopneumatic Pressure  Medium    Vasopneumatic Temperature   34      Manual Therapy   Manual Therapy  Joint mobilization;Soft tissue mobilization;Passive ROM    Joint Mobilization  patella    Soft tissue mobilization  knee and quad    Passive ROM  flex and ext               PT Short Term Goals - 02/11/18 1355      PT SHORT TERM GOAL #1   Title  independent with initial HEP    Status  Achieved        PT Long Term Goals - 02/07/18 1104      PT LONG TERM GOAL #1   Title  independent with  HEP and understand how to progress herself    Time  8    Period  Weeks    Status  New      PT LONG TERM GOAL #2   Title  increase strength of the right knee to 4+/5    Time  8    Period  Weeks    Status  New      PT LONG TERM GOAL #3   Title  walk without deviation all distances    Time  8    Period  Weeks    Status  New      PT LONG TERM GOAL #4   Title  increase ROM of the right knee to 0-120 degrees flexion    Time  8    Period  Weeks    Status  New       PT LONG TERM GOAL #5   Title  go up and down stairs reciprocally    Time  8    Period  Weeks    Status  New            Plan - 02/11/18 1355    Clinical Impression Statement  STG met. excellent increase in ROM ( measurements taken after MT and ex) cuing to increase knee flexion with swing phase of gait as pt holds in ext and hip hikes with circumduction    PT Treatment/Interventions  ADLs/Self Care Home Management;Cryotherapy;Electrical Stimulation;Gait training;Stair training;Functional mobility training;Therapeutic activities;Therapeutic exercise;Balance training;Neuromuscular re-education;Manual techniques;Patient/family education;Vasopneumatic Device    PT Next Visit Plan  continue to work on gait, ROM and strength       Patient will benefit from skilled therapeutic intervention in order to improve the following deficits and impairments:  Abnormal gait, Decreased activity tolerance, Decreased balance, Decreased mobility, Decreased strength, Increased edema, Impaired flexibility, Pain, Increased muscle spasms, Difficulty walking, Decreased range of motion  Visit Diagnosis: Acute pain of right knee  Stiffness of right knee, not elsewhere classified  Difficulty in walking, not elsewhere classified  Localized edema  Muscle weakness (generalized)     Problem List Patient Active Problem List   Diagnosis Date Noted  . S/P total knee replacement 01/27/2018  . Abnormal ECG 05/03/2015  . 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 OA (osteoarthritis) of knee 01/10/2015  . Arthritis of knee, degenerative 01/10/2015  . Bursitis, hip 01/08/2014  . Ecchymosis 12/29/2013  . Annual physical exam 11/14/2012  . FECAL INCONTINENCE 03/14/2010  . PERSONAL HX COLONIC POLYPS 03/14/2010  . ARTHRITIS, HANDS, BILATERAL 11/15/2009  . ARTHRITIS, KNEES, BILATERAL 12/07/2008  . HYPOTHYROIDISM 09/28/2008  . 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 OSTEOPENIA 09/28/2008     Naquisha Whitehair,ANGIE PTA 02/11/2018, 1:57 PM  [place], [place], [address] Phone: [phone]   Fax:  [phone]  Name: ARIYANA FAW MRN: [id number] Date of Birth: 03/14/1946

## 2018-02-14 ENCOUNTER — Encounter: Payer: Self-pay | Admitting: Physical Therapy

## 2018-02-14 ENCOUNTER — Ambulatory Visit: Payer: Medicare Other | Admitting: Physical Therapy

## 2018-02-14 DIAGNOSIS — G8929 Other chronic pain: Secondary | ICD-10-CM

## 2018-02-14 DIAGNOSIS — M25661 Stiffness of right knee, not elsewhere classified: Secondary | ICD-10-CM

## 2018-02-14 DIAGNOSIS — R278 Other lack of coordination: Secondary | ICD-10-CM

## 2018-02-14 DIAGNOSIS — R262 Difficulty in walking, not elsewhere classified: Secondary | ICD-10-CM

## 2018-02-14 DIAGNOSIS — R600 Localized edema: Secondary | ICD-10-CM

## 2018-02-14 DIAGNOSIS — M25561 Pain in right knee: Secondary | ICD-10-CM | POA: Diagnosis not present

## 2018-02-14 DIAGNOSIS — M7989 Other specified soft tissue disorders: Secondary | ICD-10-CM

## 2018-02-14 DIAGNOSIS — M25551 Pain in right hip: Secondary | ICD-10-CM

## 2018-02-14 DIAGNOSIS — M6281 Muscle weakness (generalized): Secondary | ICD-10-CM

## 2018-02-14 NOTE — Therapy (Signed)
[place], [place], [address] Phone: [phone]   Fax:  [phone]  Physical Therapy Treatment  Patient Details  Name: Erin Carroll MRN: [id number] Date of Birth: 27-Nov-1946 Referring Provider: Ronnie Derby   Encounter Date: 02/14/2018  PT End of Session - 02/14/18 0945    Visit Number  2    Date for PT Re-Evaluation  04/09/18    PT Start Time  0845    PT Stop Time  0945    PT Time Calculation (min)  60 min    Behavior During Therapy  [facility] for tasks assessed/performed       Past Medical History:  Diagnosis Date   Alcoholism /alcohol abuse ([place])    sober since 1996   Arthritis    "knees, hands, fingers" (01/27/2018)   Hypothyroidism    Migraine 1979   Thyroid disease    problems with thyroid    Past Surgical History:  Procedure Laterality Date   BREAST BIOPSY Left 1977   COLONOSCOPY     EXCISION/RELEASE BURSA HIP Left 01/08/2014   Procedure: LEFT HIP [place];  Surgeon: [name], MD;  [facility];  Service: Orthopedics;  Laterality: Left;  With Anchors   FINGER SURGERY Left 12-31-13   left middle finger cyst -aspirated   JOINT REPLACEMENT     KNEE ARTHROSCOPY Right 1987; 1991; 10/2010   METACARPOPHALANGEAL JOINT ARTHROPLASTY Bilateral 2013   bilateral thumbs- Gramig   RECTAL PROLAPSE REPAIR  MAY 2015   SHOULDER ARTHROSCOPY Bilateral 2004   "? side; cleanout"   TONSILLECTOMY     TOTAL KNEE ARTHROPLASTY Left 01/10/2015   Procedure: LEFT TOTAL KNEE ARTHROPLASTY;  Surgeon: [name], MD;  [facility];  Service: Orthopedics;  Laterality: Left;   TOTAL KNEE ARTHROPLASTY Right 01/27/2018   TOTAL KNEE ARTHROPLASTY Right 01/27/2018   Procedure: TOTAL KNEE ARTHROPLASTY;  Surgeon: Vickey Huger, MD;  Location: [place];  Service: Orthopedics;  Laterality: Right;   TUBAL LIGATION  1977    There were no vitals filed for this  visit.                   [facility] Adult PT Treatment/Exercise - 02/14/18 0001      Knee/Hip Exercises: Aerobic   Nustep  L 2 6 min      Knee/Hip Exercises: Machines for Strengthening   Cybex Knee Extension  15#2x10    Cybex Knee Flexion  15#2x10    Cybex Leg Press  15#2x10      Knee/Hip Exercises: Standing   Terminal Knee Extension  Strengthening;Right;2 sets;10 reps;Other (comment) football against wall    Forward Step Up  Right;2 sets;10 reps;Step Height: 6"      Knee/Hip Exercises: Supine   Quad Sets  Strengthening;Right;1 set;10 reps    Heel Slides  AAROM;Right;1 set;15 reps      Modalities   Modalities  Cryotherapy      Cryotherapy   Number Minutes Cryotherapy  15 Minutes    Cryotherapy Location  Knee    Type of Cryotherapy  Ice pack      Manual Therapy   Manual Therapy  Joint mobilization;Soft tissue mobilization;Passive ROM    Joint Mobilization  patella    Soft tissue mobilization  knee and quad    Passive ROM  flex and ext               PT Short Term Goals -  02/11/18 1355      PT SHORT TERM GOAL #1   Title  independent with initial HEP    Status  Achieved        PT Long Term Goals - 02/07/18 1104      PT LONG TERM GOAL #1   Title  independent with  HEP and understand how to progress herself    Time  8    Period  Weeks    Status  New      PT LONG TERM GOAL #2   Title  increase strength of the right knee to 4+/5    Time  8    Period  Weeks    Status  New      PT LONG TERM GOAL #3   Title  walk without deviation all distances    Time  8    Period  Weeks    Status  New      PT LONG TERM GOAL #4   Title  increase ROM of the right knee to 0-120 degrees flexion    Time  8    Period  Weeks    Status  New      PT LONG TERM GOAL #5   Title  go up and down stairs reciprocally    Time  8    Period  Weeks    Status  New            Plan - 02/14/18 0946    Clinical Impression Statement  Cont to lack ROM both flexion  and extension.  Ambulates with decreased TKE during initial heel strike and at mid stance.  Quad weakness as expected S/P TKR       Patient will benefit from skilled therapeutic intervention in order to improve the following deficits and impairments:     Visit Diagnosis: Acute pain of right knee  Knee stiffness, right  Stiffness of right knee, not elsewhere classified  Difficulty in walking, not elsewhere classified  Localized edema  Muscle weakness (generalized)  Other lack of coordination  Pain in right hip  Chronic pain of right knee  Swelling of limb     Problem List Patient Active Problem List   Diagnosis Date Noted   S/P total knee replacement 01/27/2018   Abnormal ECG 05/03/2015   LAFB (left anterior fascicular block) 05/03/2015   OA (osteoarthritis) of knee 01/10/2015   Arthritis of knee, degenerative 01/10/2015   Bursitis, hip 01/08/2014   Ecchymosis 12/29/2013   Annual physical exam 11/14/2012   FECAL INCONTINENCE 03/14/2010   PERSONAL HX COLONIC POLYPS 03/14/2010   ARTHRITIS, HANDS, BILATERAL 11/15/2009   ARTHRITIS, KNEES, BILATERAL 12/07/2008   HYPOTHYROIDISM 09/28/2008   ARTHRITIS, CERVICAL SPINE 09/28/2008   OSTEOPENIA 09/28/2008    Olean Ree, PTA 02/14/2018, 9:52 AM  [place], [place], [address] Phone: [phone]   Fax:  [phone]  Name: Erin Carroll MRN: [id number] Date of Birth: 16-Jun-1946

## 2018-02-18 ENCOUNTER — Encounter: Payer: Self-pay | Admitting: Physical Therapy

## 2018-02-18 ENCOUNTER — Ambulatory Visit: Payer: Medicare Other | Admitting: Physical Therapy

## 2018-02-18 DIAGNOSIS — M25561 Pain in right knee: Secondary | ICD-10-CM | POA: Diagnosis not present

## 2018-02-18 DIAGNOSIS — M25661 Stiffness of right knee, not elsewhere classified: Secondary | ICD-10-CM

## 2018-02-18 DIAGNOSIS — R600 Localized edema: Secondary | ICD-10-CM

## 2018-02-18 DIAGNOSIS — R262 Difficulty in walking, not elsewhere classified: Secondary | ICD-10-CM

## 2018-02-18 DIAGNOSIS — M6281 Muscle weakness (generalized): Secondary | ICD-10-CM

## 2018-02-18 NOTE — Therapy (Signed)
[place], [place], [address] Phone: [phone]   Fax:  [phone]  Physical Therapy Treatment  Patient Details  Name: Erin Carroll MRN: [id number] Date of Birth: 09-05-1946 Referring Provider: Ronnie Derby   Encounter Date: 02/18/2018  PT End of Session - 02/18/18 0925    Visit Number  3    Date for PT Re-Evaluation  04/09/18    PT Start Time  0845    PT Stop Time  0940    PT Time Calculation (min)  55 min       Past Medical History:  Diagnosis Date  . Alcoholism /alcohol abuse ([place])    sober since 58  . Arthritis    "knees, hands, fingers" (01/27/2018)  . Hypothyroidism   . Migraine 1979  . Thyroid disease    problems with thyroid    Past Surgical History:  Procedure Laterality Date  . BREAST BIOPSY Left 1977  . COLONOSCOPY    . EXCISION/RELEASE BURSA HIP Left 01/08/2014   Procedure: LEFT HIP BURSECTOMY WITH GLUTEAL TENDON REPAIR;  Surgeon: [name], MD;  [facility];  Service: Orthopedics;  Laterality: Left;  With Anchors  . FINGER SURGERY Left 12-31-13   left middle finger cyst -aspirated  . JOINT REPLACEMENT    . KNEE ARTHROSCOPY Right 1987; 1991; 10/2010  . METACARPOPHALANGEAL JOINT ARTHROPLASTY Bilateral 2013   bilateral thumbs- Gramig  . RECTAL PROLAPSE REPAIR  MAY 2015  . SHOULDER ARTHROSCOPY Bilateral 2004   "? side; cleanout"  . TONSILLECTOMY    . TOTAL KNEE ARTHROPLASTY Left 01/10/2015   Procedure: LEFT TOTAL KNEE ARTHROPLASTY;  Surgeon: [name], MD;  [facility];  Service: Orthopedics;  Laterality: Left;  . TOTAL KNEE ARTHROPLASTY Right 01/27/2018  . TOTAL KNEE ARTHROPLASTY Right 01/27/2018   Procedure: TOTAL KNEE ARTHROPLASTY;  Surgeon: Vickey Huger, MD;  Location: [place];  Service: Orthopedics;  Laterality: Right;  . TUBAL LIGATION  1977    There were no vitals filed for this visit.  Subjective Assessment - 02/18/18 0847    Subjective   "Pretty good just stiff"    Currently in Pain?  No/denies    Pain Score  0-No pain                      [facility] Adult PT Treatment/Exercise - 02/18/18 0001      Knee/Hip Exercises: Aerobic   Recumbent Bike  3 min    Nustep  L 3 6 min      Knee/Hip Exercises: Machines for Strengthening   Cybex Leg Press  20lb 3x10, RLE knee 20lb 2x5       Knee/Hip Exercises: Standing   Heel Raises  Both;2 sets;15 reps;2 seconds    Other Standing Knee Exercises  toe clears 6in 2x10 tactile cues to keep hips paralles      Knee/Hip Exercises: Seated   Long Arc Quad  Strengthening;Right;2 sets;10 reps;Weights    Long Arc Quad Weight  3 lbs.    Hamstring Curl  Strengthening;Right;2 sets;10 reps green       Knee/Hip Exercises: Supine   Quad Sets  Strengthening;Right;10 reps;2 sets seated      Modalities   Modalities  Vasopneumatic      Vasopneumatic   Number Minutes Vasopneumatic   15 minutes    Vasopnuematic Location   Knee    Vasopneumatic Pressure  Medium    Vasopneumatic Temperature   34  Manual Therapy   Manual Therapy  Passive ROM    Joint Mobilization  patella    Soft tissue mobilization  knee and quad    Passive ROM  flex and ext               PT Short Term Goals - 02/11/18 1355      PT SHORT TERM GOAL #1   Title  independent with initial HEP    Status  Achieved        PT Long Term Goals - 02/07/18 1104      PT LONG TERM GOAL #1   Title  independent with  HEP and understand how to progress herself    Time  8    Period  Weeks    Status  New      PT LONG TERM GOAL #2   Title  increase strength of the right knee to 4+/5    Time  8    Period  Weeks    Status  New      PT LONG TERM GOAL #3   Title  walk without deviation all distances    Time  8    Period  Weeks    Status  New      PT LONG TERM GOAL #4   Title  increase ROM of the right knee to 0-120 degrees flexion    Time  8    Period  Weeks    Status  New      PT LONG TERM GOAL #5    Title  go up and down stairs reciprocally    Time  8    Period  Weeks    Status  New            Plan - 02/18/18 0626    Clinical Impression Statement  Pt reports some pain at the end range of MT. Cues to increase R hip and knee flexion with gait. Tactile cues needed to keep hip level during toe clears due to compensation. Good quad contraction with quad sets but lacks TKE.    Rehab Potential  Good    PT Frequency  2x / week    PT Duration  8 weeks    PT Treatment/Interventions  ADLs/Self Care Home Management;Cryotherapy;Electrical Stimulation;Gait training;Stair training;Functional mobility training;Therapeutic activities;Therapeutic exercise;Balance training;Neuromuscular re-education;Manual techniques;Patient/family education;Vasopneumatic Device    PT Next Visit Plan  continue to work on gait, ROM and strength       Patient will benefit from skilled therapeutic intervention in order to improve the following deficits and impairments:  Abnormal gait, Decreased activity tolerance, Decreased balance, Decreased mobility, Decreased strength, Increased edema, Impaired flexibility, Pain, Increased muscle spasms, Difficulty walking, Decreased range of motion  Visit Diagnosis: Acute pain of right knee  Knee stiffness, right  Stiffness of right knee, not elsewhere classified  Difficulty in walking, not elsewhere classified  Localized edema  Muscle weakness (generalized)     Problem List Patient Active Problem List   Diagnosis Date Noted  . S/P total knee replacement 01/27/2018  . Abnormal ECG 05/03/2015  . 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 OA (osteoarthritis) of knee 01/10/2015  . Arthritis of knee, degenerative 01/10/2015  . Bursitis, hip 01/08/2014  . Ecchymosis 12/29/2013  . Annual physical exam 11/14/2012  . FECAL INCONTINENCE 03/14/2010  . PERSONAL HX COLONIC POLYPS 03/14/2010  . ARTHRITIS, HANDS, BILATERAL 11/15/2009  . ARTHRITIS, KNEES,  BILATERAL 12/07/2008  . HYPOTHYROIDISM 09/28/2008  . ARTHRITIS, CERVICAL SPINE  09/28/2008  . OSTEOPENIA 09/28/2008    Scot Jun, PTA 02/18/2018, 9:27 AM  [place], [place], [address] Phone: [phone]   Fax:  [phone]  Name: Erin Carroll MRN: [id number] Date of Birth: 1946/09/17

## 2018-02-21 ENCOUNTER — Encounter: Payer: Self-pay | Admitting: Physical Therapy

## 2018-02-21 ENCOUNTER — Ambulatory Visit: Payer: Medicare Other | Admitting: Physical Therapy

## 2018-02-21 DIAGNOSIS — M25561 Pain in right knee: Secondary | ICD-10-CM | POA: Diagnosis not present

## 2018-02-21 DIAGNOSIS — R600 Localized edema: Secondary | ICD-10-CM

## 2018-02-21 DIAGNOSIS — M25661 Stiffness of right knee, not elsewhere classified: Secondary | ICD-10-CM

## 2018-02-21 DIAGNOSIS — R262 Difficulty in walking, not elsewhere classified: Secondary | ICD-10-CM

## 2018-02-21 NOTE — Therapy (Signed)
[place], [place], [address] Phone: [phone]   Fax:  [phone]  Physical Therapy Treatment  Patient Details  Name: Erin Carroll MRN: [id number] Date of Birth: 1946/05/08 Referring Provider: Ronnie Derby   Encounter Date: 02/21/2018  PT End of Session - 02/21/18 0929    Visit Number  4    Date for PT Re-Evaluation  04/09/18    PT Start Time  0845    PT Stop Time  0942    PT Time Calculation (min)  57 min    Activity Tolerance  Patient tolerated treatment well    Behavior During Therapy  [facility] for tasks assessed/performed       Past Medical History:  Diagnosis Date  . Alcoholism /alcohol abuse ([place])    sober since 64  . Arthritis    "knees, hands, fingers" (01/27/2018)  . Hypothyroidism   . Migraine 1979  . Thyroid disease    problems with thyroid    Past Surgical History:  Procedure Laterality Date  . BREAST BIOPSY Left 1977  . COLONOSCOPY    . EXCISION/RELEASE BURSA HIP Left 01/08/2014   Procedure: LEFT HIP BURSECTOMY WITH GLUTEAL TENDON REPAIR;  Surgeon: [name], MD;  [facility];  Service: Orthopedics;  Laterality: Left;  With Anchors  . FINGER SURGERY Left 12-31-13   left middle finger cyst -aspirated  . JOINT REPLACEMENT    . KNEE ARTHROSCOPY Right 1987; 1991; 10/2010  . METACARPOPHALANGEAL JOINT ARTHROPLASTY Bilateral 2013   bilateral thumbs- Gramig  . RECTAL PROLAPSE REPAIR  MAY 2015  . SHOULDER ARTHROSCOPY Bilateral 2004   "? side; cleanout"  . TONSILLECTOMY    . TOTAL KNEE ARTHROPLASTY Left 01/10/2015   Procedure: LEFT TOTAL KNEE ARTHROPLASTY;  Surgeon: [name], MD;  [facility];  Service: Orthopedics;  Laterality: Left;  . TOTAL KNEE ARTHROPLASTY Right 01/27/2018  . TOTAL KNEE ARTHROPLASTY Right 01/27/2018   Procedure: TOTAL KNEE ARTHROPLASTY;  Surgeon: Vickey Huger, MD;  Location: [place];  Service: Orthopedics;  Laterality: Right;  . TUBAL  LIGATION  1977    There were no vitals filed for this visit.  Subjective Assessment - 02/21/18 0852    Subjective  I am just very stiff    Currently in Pain?  Yes    Pain Score  1     Pain Location  Knee    Pain Orientation  Right    Pain Descriptors / Indicators  Tightness         [facility] PT Assessment - 02/21/18 0001      AROM   Right Knee Flexion  93      PROM   Right Knee Flexion  102                  [facility] Adult PT Treatment/Exercise - 02/21/18 0001      Ambulation/Gait   Gait Comments  gait in hall, resisted gait, stairs step over step with a lot of cues      Knee/Hip Exercises: Aerobic   Elliptical  R=5 I=6 x 4 minutes    Nustep  L 4 6 min      Vasopneumatic   Number Minutes Vasopneumatic   15 minutes    Vasopnuematic Location   Knee    Vasopneumatic Pressure  Medium    Vasopneumatic Temperature   34      Manual Therapy   Manual Therapy  Passive ROM  Joint Mobilization  focused a lot on scar mobilizations    Soft tissue mobilization  quad area and the posterior knee    Passive ROM  flex and ext, supine, sitting and prone               PT Short Term Goals - 02/11/18 1355      PT SHORT TERM GOAL #1   Title  independent with initial HEP    Status  Achieved        PT Long Term Goals - 02/21/18 0931      PT LONG TERM GOAL #1   Title  independent with  HEP and understand how to progress herself    Status  Partially Met      PT LONG TERM GOAL #3   Title  walk without deviation all distances    Status  Partially Met      PT LONG TERM GOAL #4   Title  increase ROM of the right knee to 0-120 degrees flexion    Status  On-going            Plan - 02/21/18 0930    Clinical Impression Statement  Pt is very stiff, she has some tightness of the scar just superior to the patella, she is very tender in the posterior knee just below popliteal crease.  I focused a lot more on manual techniques today to increase the ROM    PT Next  Visit Plan  see if increased manual techniques helped, also gave her LLLD stretch for flexion    Consulted and Agree with Plan of Care  Patient       Patient will benefit from skilled therapeutic intervention in order to improve the following deficits and impairments:  Abnormal gait, Decreased activity tolerance, Decreased balance, Decreased mobility, Decreased strength, Increased edema, Impaired flexibility, Pain, Increased muscle spasms, Difficulty walking, Decreased range of motion  Visit Diagnosis: Acute pain of right knee  Knee stiffness, right  Stiffness of right knee, not elsewhere classified  Difficulty in walking, not elsewhere classified  Localized edema     Problem List Patient Active Problem List   Diagnosis Date Noted  . S/P total knee replacement 01/27/2018  . Abnormal ECG 05/03/2015  . 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 OA (osteoarthritis) of knee 01/10/2015  . Arthritis of knee, degenerative 01/10/2015  . Bursitis, hip 01/08/2014  . Ecchymosis 12/29/2013  . Annual physical exam 11/14/2012  . FECAL INCONTINENCE 03/14/2010  . PERSONAL HX COLONIC POLYPS 03/14/2010  . ARTHRITIS, HANDS, BILATERAL 11/15/2009  . ARTHRITIS, KNEES, BILATERAL 12/07/2008  . HYPOTHYROIDISM 09/28/2008  . 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 OSTEOPENIA 09/28/2008    Sumner Boast., PT 02/21/2018, 9:33 AM  [place], [place], [address] Phone: [phone]   Fax:  [phone]  Name: RHIAN ASEBEDO MRN: [id number] Date of Birth: May 23, 1946

## 2018-02-25 ENCOUNTER — Ambulatory Visit: Payer: Medicare Other | Admitting: Physical Therapy

## 2018-02-25 DIAGNOSIS — M25661 Stiffness of right knee, not elsewhere classified: Secondary | ICD-10-CM

## 2018-02-25 DIAGNOSIS — R262 Difficulty in walking, not elsewhere classified: Secondary | ICD-10-CM

## 2018-02-25 DIAGNOSIS — M25561 Pain in right knee: Secondary | ICD-10-CM | POA: Diagnosis not present

## 2018-02-25 DIAGNOSIS — R600 Localized edema: Secondary | ICD-10-CM

## 2018-02-25 NOTE — Therapy (Signed)
[place], [place], [address] Phone: [phone]   Fax:  [phone]  Physical Therapy Treatment  Patient Details  Name: Erin Carroll MRN: [id number] Date of Birth: Feb 19, 1946 Referring Provider: Ronnie Derby   Encounter Date: 02/25/2018  PT End of Session - 02/25/18 0927    Visit Number  5    Date for PT Re-Evaluation  04/09/18    PT Start Time  0845    PT Stop Time  0945    PT Time Calculation (min)  60 min       Past Medical History:  Diagnosis Date  . Alcoholism /alcohol abuse ([place])    sober since 5  . Arthritis    "knees, hands, fingers" (01/27/2018)  . Hypothyroidism   . Migraine 1979  . Thyroid disease    problems with thyroid    Past Surgical History:  Procedure Laterality Date  . BREAST BIOPSY Left 1977  . COLONOSCOPY    . EXCISION/RELEASE BURSA HIP Left 01/08/2014   Procedure: LEFT HIP BURSECTOMY WITH GLUTEAL TENDON REPAIR;  Surgeon: [name], MD;  [facility];  Service: Orthopedics;  Laterality: Left;  With Anchors  . FINGER SURGERY Left 12-31-13   left middle finger cyst -aspirated  . JOINT REPLACEMENT    . KNEE ARTHROSCOPY Right 1987; 1991; 10/2010  . METACARPOPHALANGEAL JOINT ARTHROPLASTY Bilateral 2013   bilateral thumbs- Gramig  . RECTAL PROLAPSE REPAIR  MAY 2015  . SHOULDER ARTHROSCOPY Bilateral 2004   "? side; cleanout"  . TONSILLECTOMY    . TOTAL KNEE ARTHROPLASTY Left 01/10/2015   Procedure: LEFT TOTAL KNEE ARTHROPLASTY;  Surgeon: [name], MD;  [facility];  Service: Orthopedics;  Laterality: Left;  . TOTAL KNEE ARTHROPLASTY Right 01/27/2018  . TOTAL KNEE ARTHROPLASTY Right 01/27/2018   Procedure: TOTAL KNEE ARTHROPLASTY;  Surgeon: Vickey Huger, MD;  Location: [facility];  Service: Orthopedics;  Laterality: Right;  . TUBAL LIGATION  1977    There were no vitals filed for this visit.  Subjective Assessment - 02/25/18 0848    Subjective  so  stiff and difficulty sleeping at night    Currently in Pain?  Yes    Pain Score  3     Pain Location  Knee    Pain Orientation  Right    Pain Descriptors / Indicators  Tightness         [facility] PT Assessment - 02/25/18 0001      PROM   Right Knee Extension  3    Right Knee Flexion  108            No data recorded       [facility] Adult PT Treatment/Exercise - 02/25/18 0001      Knee/Hip Exercises: Aerobic   Nustep  L 5 6 min      Knee/Hip Exercises: Machines for Strengthening   Cybex Knee Extension  10# 2 sets 10 some medial knee pain so decreased wt to 10#    Cybex Knee Flexion  15#2x10    Cybex Leg Press  20# 2 sets 10, RT leg only 10 times no wt for ROM calf raises 20# 2 sets 15      Knee/Hip Exercises: Standing   Lateral Step Up  Right;1 set;10 reps;Hand Hold: 2;Step Height: 6"    Forward Step Up  Right;1 set;10 reps;Hand Hold: 2;Step Height: 6"    Other Standing Knee Exercises  toe clears 6in 2x10  cuing needed to keep hips //      Knee/Hip Exercises: Seated   Long Arc Quad  Strengthening;Right;2 sets;10 reps;Weights 3 sec TKE ext hold    Long Arc Quad Weight  3 lbs.    Other Seated Knee/Hip Exercises  TKE green tband 2 sets 10      Vasopneumatic   Number Minutes Vasopneumatic   15 minutes    Vasopnuematic Location   Knee    Vasopneumatic Pressure  Medium    Vasopneumatic Temperature   34      Manual Therapy   Manual Therapy  Passive ROM;Joint mobilization;Soft tissue mobilization;Taping    Joint Mobilization  belt mobs flex/ext,pat mobs    Soft tissue mobilization  quad     Passive ROM  flex/ext    Kinesiotex  Create Space ant/post kn               PT Short Term Goals - 02/11/18 1355      PT SHORT TERM GOAL #1   Title  independent with initial HEP    Status  Achieved        PT Long Term Goals - 02/25/18 9924      PT LONG TERM GOAL #1   Title  independent with  HEP and understand how to progress herself    Status  Partially Met       PT LONG TERM GOAL #2   Title  increase strength of the right knee to 4+/5    Status  Partially Met      PT LONG TERM GOAL #3   Title  walk without deviation all distances    Status  Partially Met      PT LONG TERM GOAL #4   Title  increase ROM of the right knee to 0-120 degrees flexion    Status  Partially Met      PT LONG TERM GOAL #5   Title  go up and down stairs reciprocally    Status  On-going            Plan - 02/25/18 0927    Clinical Impression Statement  good ROM but with gait and func activitiies compensates with poor tech d/t stiffness- cuing needed. trial of kinesiotape. focus on TKE    PT Treatment/Interventions  ADLs/Self Care Home Management;Cryotherapy;Electrical Stimulation;Gait training;Stair training;Functional mobility training;Therapeutic activities;Therapeutic exercise;Balance training;Neuromuscular re-education;Manual techniques;Patient/family education;Vasopneumatic Device    PT Next Visit Plan  assess tape and progress as tolerated with ROM and strength       Patient will benefit from skilled therapeutic intervention in order to improve the following deficits and impairments:  Abnormal gait, Decreased activity tolerance, Decreased balance, Decreased mobility, Decreased strength, Increased edema, Impaired flexibility, Pain, Increased muscle spasms, Difficulty walking, Decreased range of motion  Visit Diagnosis: Acute pain of right knee  Knee stiffness, right  Stiffness of right knee, not elsewhere classified  Difficulty in walking, not elsewhere classified  Localized edema     Problem List Patient Active Problem List   Diagnosis Date Noted  . S/P total knee replacement 01/27/2018  . Abnormal ECG 05/03/2015  . 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 OA (osteoarthritis) of knee 01/10/2015  . Arthritis of knee, degenerative 01/10/2015  . Bursitis, hip 01/08/2014  . Ecchymosis 12/29/2013  . Annual physical exam 11/14/2012  .  FECAL INCONTINENCE 03/14/2010  . PERSONAL HX COLONIC POLYPS 03/14/2010  . ARTHRITIS, HANDS, BILATERAL 11/15/2009  . ARTHRITIS, KNEES, BILATERAL 12/07/2008  . HYPOTHYROIDISM 09/28/2008  .  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 OSTEOPENIA 09/28/2008    Jacqui Headen,ANGIE PTA 02/25/2018, 9:29 AM  [place], [place], [address] Phone: [phone]   Fax:  [phone]  Name: CARDELIA SASSANO MRN: [id number] Date of Birth: [birth year]

## 2018-02-27 ENCOUNTER — Encounter: Payer: Medicare Other | Admitting: Physical Therapy

## 2018-02-28 ENCOUNTER — Encounter: Payer: Self-pay | Admitting: Physical Therapy

## 2018-02-28 ENCOUNTER — Ambulatory Visit: Payer: Medicare Other | Admitting: Physical Therapy

## 2018-02-28 DIAGNOSIS — M25561 Pain in right knee: Secondary | ICD-10-CM

## 2018-02-28 DIAGNOSIS — M25661 Stiffness of right knee, not elsewhere classified: Secondary | ICD-10-CM

## 2018-02-28 DIAGNOSIS — R262 Difficulty in walking, not elsewhere classified: Secondary | ICD-10-CM

## 2018-02-28 NOTE — Therapy (Signed)
[place], [place], [address] Phone: [phone]   Fax:  [phone]  Physical Therapy Treatment  Patient Details  Name: Erin Carroll MRN: [id number] Date of Birth: 1946-01-04 Referring Provider: Ronnie Derby   Encounter Date: 02/28/2018  PT End of Session - 02/28/18 1053    Visit Number  6    Date for PT Re-Evaluation  04/09/18    PT Start Time  0930    PT Stop Time  1026    PT Time Calculation (min)  56 min    Activity Tolerance  Patient tolerated treatment well    Behavior During Therapy  [facility] for tasks assessed/performed       Past Medical History:  Diagnosis Date  . Alcoholism /alcohol abuse ([place])    sober since 15  . Arthritis    "knees, hands, fingers" (01/27/2018)  . Hypothyroidism   . Migraine 1979  . Thyroid disease    problems with thyroid    Past Surgical History:  Procedure Laterality Date  . BREAST BIOPSY Left 1977  . COLONOSCOPY    . EXCISION/RELEASE BURSA HIP Left 01/08/2014   Procedure: LEFT HIP BURSECTOMY WITH GLUTEAL TENDON REPAIR;  Surgeon: [name], MD;  [facility];  Service: Orthopedics;  Laterality: Left;  With Anchors  . FINGER SURGERY Left 12-31-13   left middle finger cyst -aspirated  . JOINT REPLACEMENT    . KNEE ARTHROSCOPY Right 1987; 1991; 10/2010  . METACARPOPHALANGEAL JOINT ARTHROPLASTY Bilateral 2013   bilateral thumbs- Gramig  . RECTAL PROLAPSE REPAIR  MAY 2015  . SHOULDER ARTHROSCOPY Bilateral 2004   "? side; cleanout"  . TONSILLECTOMY    . TOTAL KNEE ARTHROPLASTY Left 01/10/2015   Procedure: LEFT TOTAL KNEE ARTHROPLASTY;  Surgeon: [name], MD;  [facility];  Service: Orthopedics;  Laterality: Left;  . TOTAL KNEE ARTHROPLASTY Right 01/27/2018  . TOTAL KNEE ARTHROPLASTY Right 01/27/2018   Procedure: TOTAL KNEE ARTHROPLASTY;  Surgeon: Vickey Huger, MD;  Location: [facility];  Service: Orthopedics;  Laterality: Right;  . TUBAL  LIGATION  1977    There were no vitals filed for this visit.  Subjective Assessment - 02/28/18 0934    Subjective  Stiff and have a few sore spots.    Currently in Pain?  Yes    Pain Score  4     Pain Location  Knee    Pain Orientation  Right    Pain Descriptors / Indicators  Aching;Tightness;Sore    Aggravating Factors   walking, bending                No data recorded       [facility] Adult PT Treatment/Exercise - 02/28/18 0001      Ambulation/Gait   Gait Comments  gait around building working on bending knee      Knee/Hip Exercises: Stretches   Press photographer  20 seconds;3 reps      Knee/Hip Exercises: Aerobic   Nustep  L 5 6 min      Knee/Hip Exercises: Machines for Strengthening   Cybex Leg Press  no weight working on more flexion      Knee/Hip Exercises: Seated   Long Arc Quad  Strengthening;Right;2 sets;10 reps;Weights    Long Arc Quad Weight  3 lbs.      Modalities   Modalities  Ultrasound;Electrical Stimulation      Cryotherapy   Number Minutes Cryotherapy  15 Minutes  Cryotherapy Location  Knee    Type of Cryotherapy  Ice pack      Electrical Stimulation   Electrical Stimulation Location  medial anterior knee and posterior lateral knee    Electrical Stimulation Action  IFC    Electrical Stimulation Parameters  supine    Electrical Stimulation Goals  Pain      Manual Therapy   Joint Mobilization  belt mobs flex/ext,pat mobs    Passive ROM  flex/ext, some in prone and some contract relax               PT Short Term Goals - 02/11/18 1355      PT SHORT TERM GOAL #1   Title  independent with initial HEP    Status  Achieved        PT Long Term Goals - 02/28/18 1056      PT LONG TERM GOAL #1   Title  independent with  HEP and understand how to progress herself    Status  Achieved            Plan - 02/28/18 1054    Clinical Impression Statement  Patient with a very tender spot that is sore in the right posterior  lateral knee, she reports that this is the spot that makes her not bend her knee while walking.  I tried some [place] and estim on this today to see if it would help the pain that causes her to change her gait    PT Next Visit Plan  See if today's treatment helped needs a lot of cues to walk correctly    Consulted and Agree with Plan of Care  Patient       Patient will benefit from skilled therapeutic intervention in order to improve the following deficits and impairments:  Abnormal gait, Decreased activity tolerance, Decreased balance, Decreased mobility, Decreased strength, Increased edema, Impaired flexibility, Pain, Increased muscle spasms, Difficulty walking, Decreased range of motion  Visit Diagnosis: Acute pain of right knee  Knee stiffness, right  Stiffness of right knee, not elsewhere classified  Difficulty in walking, not elsewhere classified     Problem List Patient Active Problem List   Diagnosis Date Noted  . S/P total knee replacement 01/27/2018  . Abnormal ECG 05/03/2015  . 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 OA (osteoarthritis) of knee 01/10/2015  . Arthritis of knee, degenerative 01/10/2015  . Bursitis, hip 01/08/2014  . Ecchymosis 12/29/2013  . Annual physical exam 11/14/2012  . FECAL INCONTINENCE 03/14/2010  . PERSONAL HX COLONIC POLYPS 03/14/2010  . ARTHRITIS, HANDS, BILATERAL 11/15/2009  . ARTHRITIS, KNEES, BILATERAL 12/07/2008  . HYPOTHYROIDISM 09/28/2008  . 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 OSTEOPENIA 09/28/2008    Sumner Boast., PT 02/28/2018, 10:58 AM  [facility] [place], [place], [address] Phone: [phone]   Fax:  [phone]  Name: Erin Carroll MRN: [id number] Date of Birth: 01/29/1946

## 2018-03-04 ENCOUNTER — Ambulatory Visit: Payer: Medicare Other | Attending: Orthopedic Surgery | Admitting: Physical Therapy

## 2018-03-04 ENCOUNTER — Encounter: Payer: Self-pay | Admitting: Physical Therapy

## 2018-03-04 DIAGNOSIS — M25561 Pain in right knee: Secondary | ICD-10-CM | POA: Insufficient documentation

## 2018-03-04 DIAGNOSIS — R262 Difficulty in walking, not elsewhere classified: Secondary | ICD-10-CM

## 2018-03-04 DIAGNOSIS — M6281 Muscle weakness (generalized): Secondary | ICD-10-CM | POA: Insufficient documentation

## 2018-03-04 DIAGNOSIS — M25661 Stiffness of right knee, not elsewhere classified: Secondary | ICD-10-CM | POA: Insufficient documentation

## 2018-03-04 DIAGNOSIS — R600 Localized edema: Secondary | ICD-10-CM | POA: Diagnosis present

## 2018-03-04 NOTE — Patient Instructions (Signed)

## 2018-03-04 NOTE — Therapy (Signed)
[place], [place], [address] Phone: [phone]   Fax:  [phone]  Physical Therapy Treatment  Patient Details  Name: Erin Carroll MRN: [id number] Date of Birth: 1946-10-18 Referring Provider: Ronnie Derby   Encounter Date: 03/04/2018  PT End of Session - 03/04/18 0923    Visit Number  7    Date for PT Re-Evaluation  04/09/18    PT Start Time  0845    PT Stop Time  0935    PT Time Calculation (min)  50 min       Past Medical History:  Diagnosis Date  . Alcoholism /alcohol abuse ([place])    sober since 38  . Arthritis    "knees, hands, fingers" (01/27/2018)  . Hypothyroidism   . Migraine 1979  . Thyroid disease    problems with thyroid    Past Surgical History:  Procedure Laterality Date  . BREAST BIOPSY Left 1977  . COLONOSCOPY    . EXCISION/RELEASE BURSA HIP Left 01/08/2014   Procedure: LEFT HIP BURSECTOMY WITH GLUTEAL TENDON REPAIR;  Surgeon: [name], MD;  Location: [facility];  Service: Orthopedics;  Laterality: Left;  With Anchors  . FINGER SURGERY Left 12-31-13   left middle finger cyst -aspirated  . JOINT REPLACEMENT    . KNEE ARTHROSCOPY Right 1987; 1991; 10/2010  . METACARPOPHALANGEAL JOINT ARTHROPLASTY Bilateral 2013   bilateral thumbs- Gramig  . RECTAL PROLAPSE REPAIR  MAY 2015  . SHOULDER ARTHROSCOPY Bilateral 2004   "? side; cleanout"  . TONSILLECTOMY    . TOTAL KNEE ARTHROPLASTY Left 01/10/2015   Procedure: LEFT TOTAL KNEE ARTHROPLASTY;  Surgeon: [name], MD;  [facility];  Service: Orthopedics;  Laterality: Left;  . TOTAL KNEE ARTHROPLASTY Right 01/27/2018  . TOTAL KNEE ARTHROPLASTY Right 01/27/2018   Procedure: TOTAL KNEE ARTHROPLASTY;  Surgeon: Vickey Huger, MD;  Location: [facility];  Service: Orthopedics;  Laterality: Right;  . TUBAL LIGATION  1977    There were no vitals filed for this visit.  Subjective Assessment - 03/04/18 0847    Subjective  c/o  poteriro lateral pain esp with steps and prolonged sitting    Currently in Pain?  Yes    Pain Score  2     Pain Location  Knee    Pain Orientation  Right;Posterior;Lateral                       [facility] Adult PT Treatment/Exercise - 03/04/18 0001      Knee/Hip Exercises: Stretches   Gastroc Stretch  4 reps;10 seconds;Right    Soleus Stretch  4 reps;10 seconds;Right      Knee/Hip Exercises: Aerobic   Nustep  L 5 6 min LE only      Modalities   Modalities  Moist Heat      Moist Heat Therapy   Number Minutes Moist Heat  10 Minutes    Moist Heat Location  Other (comment) RT calf      Manual Therapy   Manual Therapy  Soft tissue mobilization    Manual therapy comments  STW to HS incertion and peroneals        Trigger Point Dry Needling - 03/04/18 0921    Consent Given?  Yes    Education Handout Provided  Yes    Muscles Treated Lower Body  Gastrocnemius    Gastrocnemius Response  Twitch response elicited;Palpable increased muscle length  PT Short Term Goals - 02/11/18 1355      PT SHORT TERM GOAL #1   Title  independent with initial HEP    Status  Achieved        PT Long Term Goals - 02/28/18 1056      PT LONG TERM GOAL #1   Title  independent with  HEP and understand how to progress herself    Status  Achieved            Plan - 03/04/18 6720    Clinical Impression Statement  pt very tender RT HS incertion and calf origin as well as peroneals. tried dry needling as well as stretches and STW. addressed gait with pt to decrease compensations    PT Treatment/Interventions  ADLs/Self Care Home Management;Cryotherapy;Electrical Stimulation;Gait training;Stair training;Functional mobility training;Therapeutic activities;Therapeutic exercise;Balance training;Neuromuscular re-education;Manual techniques;Patient/family education;Vasopneumatic Device    PT Next Visit Plan  see how pt felt after DN ,continue to monitor gait       Patient  will benefit from skilled therapeutic intervention in order to improve the following deficits and impairments:  Abnormal gait, Decreased activity tolerance, Decreased balance, Decreased mobility, Decreased strength, Increased edema, Impaired flexibility, Pain, Increased muscle spasms, Difficulty walking, Decreased range of motion  Visit Diagnosis: Acute pain of right knee  Knee stiffness, right  Stiffness of right knee, not elsewhere classified  Difficulty in walking, not elsewhere classified     Problem List Patient Active Problem List   Diagnosis Date Noted  . S/P total knee replacement 01/27/2018  . Abnormal ECG 05/03/2015  . 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 OA (osteoarthritis) of knee 01/10/2015  . Arthritis of knee, degenerative 01/10/2015  . Bursitis, hip 01/08/2014  . Ecchymosis 12/29/2013  . Annual physical exam 11/14/2012  . FECAL INCONTINENCE 03/14/2010  . PERSONAL HX COLONIC POLYPS 03/14/2010  . ARTHRITIS, HANDS, BILATERAL 11/15/2009  . ARTHRITIS, KNEES, BILATERAL 12/07/2008  . HYPOTHYROIDISM 09/28/2008  . 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 OSTEOPENIA 09/28/2008    Addilyne Backs,ANGIE PTA 03/04/2018, 9:28 AM  [place], [place], [address] Phone: [phone]   Fax:  [phone]  Name: CUCA BENASSI MRN: [id number] Date of Birth: [birth year]

## 2018-03-07 ENCOUNTER — Ambulatory Visit: Payer: Medicare Other | Admitting: Physical Therapy

## 2018-03-07 ENCOUNTER — Encounter: Payer: Self-pay | Admitting: Physical Therapy

## 2018-03-07 DIAGNOSIS — M6281 Muscle weakness (generalized): Secondary | ICD-10-CM

## 2018-03-07 DIAGNOSIS — R600 Localized edema: Secondary | ICD-10-CM

## 2018-03-07 DIAGNOSIS — R262 Difficulty in walking, not elsewhere classified: Secondary | ICD-10-CM

## 2018-03-07 DIAGNOSIS — M25661 Stiffness of right knee, not elsewhere classified: Secondary | ICD-10-CM

## 2018-03-07 DIAGNOSIS — M25561 Pain in right knee: Secondary | ICD-10-CM | POA: Diagnosis not present

## 2018-03-07 NOTE — Therapy (Signed)
[place], [place], [address] Phone: [phone]   Fax:  [phone]  Physical Therapy Treatment  Patient Details  Name: Erin Carroll MRN: [id number] Date of Birth: 02/17/1946 Referring Provider: Ronnie Derby   Encounter Date: 03/07/2018  PT End of Session - 03/07/18 0925    Visit Number  8    Date for PT Re-Evaluation  04/09/18    PT Start Time  0845    PT Stop Time  0945    PT Time Calculation (min)  60 min    Activity Tolerance  Patient tolerated treatment well    Behavior During Therapy  [facility] for tasks assessed/performed       Past Medical History:  Diagnosis Date  . Alcoholism /alcohol abuse ([place])    sober since 53  . Arthritis    "knees, hands, fingers" (01/27/2018)  . Hypothyroidism   . Migraine 1979  . Thyroid disease    problems with thyroid    Past Surgical History:  Procedure Laterality Date  . BREAST BIOPSY Left 1977  . COLONOSCOPY    . EXCISION/RELEASE BURSA HIP Left 01/08/2014   Procedure: LEFT HIP BURSECTOMY WITH GLUTEAL TENDON REPAIR;  Surgeon: [name], MD;  [facility];  Service: Orthopedics;  Laterality: Left;  With Anchors  . FINGER SURGERY Left 12-31-13   left middle finger cyst -aspirated  . JOINT REPLACEMENT    . KNEE ARTHROSCOPY Right 1987; 1991; 10/2010  . METACARPOPHALANGEAL JOINT ARTHROPLASTY Bilateral 2013   bilateral thumbs- Gramig  . RECTAL PROLAPSE REPAIR  MAY 2015  . SHOULDER ARTHROSCOPY Bilateral 2004   "? side; cleanout"  . TONSILLECTOMY    . TOTAL KNEE ARTHROPLASTY Left 01/10/2015   Procedure: LEFT TOTAL KNEE ARTHROPLASTY;  Surgeon: [name], MD;  [facility];  Service: Orthopedics;  Laterality: Left;  . TOTAL KNEE ARTHROPLASTY Right 01/27/2018  . TOTAL KNEE ARTHROPLASTY Right 01/27/2018   Procedure: TOTAL KNEE ARTHROPLASTY;  Surgeon: Vickey Huger, MD;  Location: [place];  Service: Orthopedics;  Laterality: Right;  . TUBAL LIGATION   1977    There were no vitals filed for this visit.  Subjective Assessment - 03/07/18 0853    Subjective  Patient frustrated due to pain and stiffness, her biggest issue is pain when she gets to toe off, c/o pain in the right posterior knee, she is very tender here as well    Currently in Pain?  Yes    Pain Score  2     Pain Location  Knee    Pain Orientation  Right;Posterior;Lateral    Pain Descriptors / Indicators  Aching;Tightness;Sore    Aggravating Factors   walking, bending         [facility] PT Assessment - 03/07/18 0001      AROM   Right Knee Extension  5    Right Knee Flexion  101      PROM   Right Knee Extension  0    Right Knee Flexion  108                   [facility] Adult PT Treatment/Exercise - 03/07/18 0001      Ambulation/Gait   Gait Comments  gait in buidling trying to work on the natural bend and limit the hip hiking      Knee/Hip Exercises: Aerobic   Recumbent Bike  5 min with hip hike      Knee/Hip Exercises: Machines  for Strengthening   Cybex Knee Extension  5# 2x10    Cybex Knee Flexion  20# 2x10    Cybex Leg Press  no weight working on more flexion      Vasopneumatic   Number Minutes Vasopneumatic   15 minutes    Vasopnuematic Location   Knee    Vasopneumatic Pressure  Medium    Vasopneumatic Temperature   34      Manual Therapy   Manual Therapy  Joint mobilization;Soft tissue mobilization;Passive ROM    Soft tissue mobilization  patellar mobilization, scar and STM of the knee,     Passive ROM  passive ROM to end range, some contract relax, tried various positions               PT Short Term Goals - 02/11/18 1355      PT SHORT TERM GOAL #1   Title  independent with initial HEP    Status  Achieved        PT Long Term Goals - 03/07/18 3976      PT LONG TERM GOAL #1   Title  independent with  HEP and understand how to progress herself    Status  Achieved      PT LONG TERM GOAL #2   Title  increase strength of the  right knee to 4+/5    Status  Partially Met      PT LONG TERM GOAL #3   Title  walk without deviation all distances    Status  Partially Met      PT LONG TERM GOAL #4   Title  increase ROM of the right knee to 0-120 degrees flexion    Status  Partially Met      PT LONG TERM GOAL #5   Title  go up and down stairs reciprocally    Status  Partially Met            Plan - 03/07/18 0925    Clinical Impression Statement  Patient continues to c/o a lot of soreness in the medial right knee, reports worse after any sitting, she has a small knot in the posterior lateral right knee that is very tender, she reports that this is where she gets pain when walking and this causes her to walk stifflegged and does a hip hike.  Her ROM is getting better, but she stiffens up very quickly and has to get it moving again after any sitting    PT Next Visit Plan  she will be seeing MD next week, will continue after that if MD approves    Consulted and Agree with Plan of Care  Patient       Patient will benefit from skilled therapeutic intervention in order to improve the following deficits and impairments:  Abnormal gait, Decreased activity tolerance, Decreased balance, Decreased mobility, Decreased strength, Increased edema, Impaired flexibility, Pain, Increased muscle spasms, Difficulty walking, Decreased range of motion  Visit Diagnosis: Acute pain of right knee  Knee stiffness, right  Stiffness of right knee, not elsewhere classified  Difficulty in walking, not elsewhere classified  Localized edema  Muscle weakness (generalized)     Problem List Patient Active Problem List   Diagnosis Date Noted  . S/P total knee replacement 01/27/2018  . Abnormal ECG 05/03/2015  . 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 OA (osteoarthritis) of knee 01/10/2015  . Arthritis of knee, degenerative 01/10/2015  . Bursitis, hip 01/08/2014  . Ecchymosis 12/29/2013  . Annual  physical exam  11/14/2012  . FECAL INCONTINENCE 03/14/2010  . PERSONAL HX COLONIC POLYPS 03/14/2010  . ARTHRITIS, HANDS, BILATERAL 11/15/2009  . ARTHRITIS, KNEES, BILATERAL 12/07/2008  . HYPOTHYROIDISM 09/28/2008  . 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 OSTEOPENIA 09/28/2008    Sumner Boast., PT 03/07/2018, 9:28 AM  [facility] [place], [place], [address] Phone: [phone]   Fax:  [phone]  Name: SHAWNETTE AUGELLO MRN: [id number] Date of Birth: 20-Jun-1946

## 2018-03-13 ENCOUNTER — Ambulatory Visit: Payer: Medicare Other | Admitting: Physical Therapy

## 2018-03-13 DIAGNOSIS — M25561 Pain in right knee: Secondary | ICD-10-CM | POA: Diagnosis not present

## 2018-03-13 DIAGNOSIS — M25661 Stiffness of right knee, not elsewhere classified: Secondary | ICD-10-CM

## 2018-03-13 DIAGNOSIS — R600 Localized edema: Secondary | ICD-10-CM

## 2018-03-13 DIAGNOSIS — R262 Difficulty in walking, not elsewhere classified: Secondary | ICD-10-CM

## 2018-03-13 NOTE — Therapy (Signed)
[place], [place], [address] Phone: [phone]   Fax:  [phone]  Physical Therapy Treatment  Patient Details  Name: Erin Carroll MRN: [id number] Date of Birth: 12-07-1945 Referring Provider: Ronnie Derby   Encounter Date: 03/13/2018  PT End of Session - 03/13/18 0816    Visit Number  9    Date for PT Re-Evaluation  04/09/18    PT Start Time  0800    PT Stop Time  0900    PT Time Calculation (min)  60 min       Past Medical History:  Diagnosis Date  . Alcoholism /alcohol abuse ([place])    sober since 69  . Arthritis    "knees, hands, fingers" (01/27/2018)  . Hypothyroidism   . Migraine 1979  . Thyroid disease    problems with thyroid    Past Surgical History:  Procedure Laterality Date  . BREAST BIOPSY Left 1977  . COLONOSCOPY    . EXCISION/RELEASE BURSA HIP Left 01/08/2014   Procedure: LEFT HIP BURSECTOMY WITH GLUTEAL TENDON REPAIR;  Surgeon: [name], MD;  [facility];  Service: Orthopedics;  Laterality: Left;  With Anchors  . FINGER SURGERY Left 12-31-13   left middle finger cyst -aspirated  . JOINT REPLACEMENT    . KNEE ARTHROSCOPY Right 1987; 1991; 10/2010  . METACARPOPHALANGEAL JOINT ARTHROPLASTY Bilateral 2013   bilateral thumbs- Gramig  . RECTAL PROLAPSE REPAIR  MAY 2015  . SHOULDER ARTHROSCOPY Bilateral 2004   "? side; cleanout"  . TONSILLECTOMY    . TOTAL KNEE ARTHROPLASTY Left 01/10/2015   Procedure: LEFT TOTAL KNEE ARTHROPLASTY;  Surgeon: [name], MD;  [facility];  Service: Orthopedics;  Laterality: Left;  . TOTAL KNEE ARTHROPLASTY Right 01/27/2018  . TOTAL KNEE ARTHROPLASTY Right 01/27/2018   Procedure: TOTAL KNEE ARTHROPLASTY;  Surgeon: Vickey Huger, MD;  Location: [facility];  Service: Orthopedics;  Laterality: Right;  . TUBAL LIGATION  1977    There were no vitals filed for this visit.  Subjective Assessment - 03/13/18 0802    Subjective  saw  PA, felt all was okay. gave me some nerve medicine and felt back was Bakers Cyst. couple more weeks PT then will be okay    Currently in Pain?  Yes    Pain Score  3     Pain Location  Knee    Pain Orientation  Right;Posterior;Lateral                       [facility] Adult PT Treatment/Exercise - 03/13/18 0001      Knee/Hip Exercises: Aerobic   Elliptical  R 4 I 6 3 fwd/3 back    Nustep  L 5 7 min      Knee/Hip Exercises: Machines for Strengthening   Cybex Knee Extension  5# 2x10 eccentric lowering RT    Cybex Knee Flexion  10# 2x10 RT leg only    Cybex Leg Press  30# 2 sets 10 calf raises 30# 2 sets 10      Knee/Hip Exercises: Standing   Forward Step Up  Right;2 sets;10 reps;Hand Hold: 2;Step Height: 4";Step Height: 6" 1 set each height over and back    Walking with Sports [employer]  fwd/back and SW      Manual Therapy   Manual Therapy  Joint mobilization;Soft tissue mobilization;Passive ROM    Joint Mobilization  belt mobs for jt capsule and flexion  Passive ROM  flex/ext             PT Education - 03/13/18 0815    Education provided  Yes    Education Details  issued Handout of machines and wts to do at [facility]) Educated  Patient    Methods  Explanation;Demonstration;Handout    Comprehension  Verbalized understanding;Returned demonstration       PT Short Term Goals - 02/11/18 1355      PT SHORT TERM GOAL #1   Title  independent with initial HEP    Status  Achieved        PT Long Term Goals - 03/13/18 0815      PT LONG TERM GOAL #1   Title  independent with  HEP and understand how to progress herself    Status  Achieved      PT LONG TERM GOAL #2   Title  increase strength of the right knee to 4+/5    Status  Partially Met      PT LONG TERM GOAL #3   Title  walk without deviation all distances    Status  Partially Met      PT LONG TERM GOAL #4   Title  increase ROM of the right knee to 0-120 degrees flexion    Status   Partially Met      PT LONG TERM GOAL #5   Title  go up and down stairs reciprocally    Status  Partially Met            Plan - 03/13/18 0816    Clinical Impression Statement  focus session on strength/func and ROM with cuing verb and tactile to decrease compensations. pt unbale ot get appt next week so issues handout of ex to do at gym. progressing with goals pt responded well to belt mobs and jt capsule stretches    PT Treatment/Interventions  ADLs/Self Care Home Management;Cryotherapy;Electrical Stimulation;Gait training;Stair training;Functional mobility training;Therapeutic activities;Therapeutic exercise;Balance training;Neuromuscular re-education;Manual techniques;Patient/family education;Vasopneumatic Device    PT Next Visit Plan  assess and progress       Patient will benefit from skilled therapeutic intervention in order to improve the following deficits and impairments:  Abnormal gait, Decreased activity tolerance, Decreased balance, Decreased mobility, Decreased strength, Increased edema, Impaired flexibility, Pain, Increased muscle spasms, Difficulty walking, Decreased range of motion  Visit Diagnosis: Acute pain of right knee  Knee stiffness, right  Stiffness of right knee, not elsewhere classified  Difficulty in walking, not elsewhere classified  Localized edema     Problem List Patient Active Problem List   Diagnosis Date Noted  . S/P total knee replacement 01/27/2018  . Abnormal ECG 05/03/2015  . 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 OA (osteoarthritis) of knee 01/10/2015  . Arthritis of knee, degenerative 01/10/2015  . Bursitis, hip 01/08/2014  . Ecchymosis 12/29/2013  . Annual physical exam 11/14/2012  . FECAL INCONTINENCE 03/14/2010  . PERSONAL HX COLONIC POLYPS 03/14/2010  . ARTHRITIS, HANDS, BILATERAL 11/15/2009  . ARTHRITIS, KNEES, BILATERAL 12/07/2008  . HYPOTHYROIDISM 09/28/2008  . 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  OSTEOPENIA 09/28/2008    Zahari Fazzino,ANGIE PTA 03/13/2018, 8:47 AM  [place], [place], [address] Phone: [phone]   Fax:  [phone]  Name: INIYA MATZEK MRN: [id number] Date of Birth: November 09, 1946

## 2018-03-25 ENCOUNTER — Ambulatory Visit: Payer: Medicare Other | Admitting: Physical Therapy

## 2018-03-25 ENCOUNTER — Encounter: Payer: Self-pay | Admitting: Physical Therapy

## 2018-03-25 DIAGNOSIS — M25561 Pain in right knee: Secondary | ICD-10-CM | POA: Diagnosis not present

## 2018-03-25 DIAGNOSIS — M25661 Stiffness of right knee, not elsewhere classified: Secondary | ICD-10-CM

## 2018-03-25 DIAGNOSIS — R262 Difficulty in walking, not elsewhere classified: Secondary | ICD-10-CM

## 2018-03-25 NOTE — Therapy (Signed)
[place], [place], [address] Phone: [phone]   Fax:  [phone]  Physical Therapy Treatment  Patient Details  Name: Erin Carroll MRN: [id number] Date of Birth: [birth year] Referring Provider: Ronnie Derby   Encounter Date: 03/25/2018  PT End of Session - 03/25/18 1608    Visit Number  10    Date for PT Re-Evaluation  04/09/18    PT Start Time  1530    PT Stop Time  1612    PT Time Calculation (min)  42 min    Activity Tolerance  Patient tolerated treatment well    Behavior During Therapy  [facility] for tasks assessed/performed       Past Medical History:  Diagnosis Date  . Alcoholism /alcohol abuse ([place])    sober since 73  . Arthritis    "knees, hands, fingers" (01/27/2018)  . Hypothyroidism   . Migraine 1979  . Thyroid disease    problems with thyroid    Past Surgical History:  Procedure Laterality Date  . BREAST BIOPSY Left 1977  . COLONOSCOPY    . EXCISION/RELEASE BURSA HIP Left 01/08/2014   Procedure: LEFT HIP BURSECTOMY WITH GLUTEAL TENDON REPAIR;  Surgeon: [name], MD;  [facility];  Service: Orthopedics;  Laterality: Left;  With Anchors  . FINGER SURGERY Left 12-31-13   left middle finger cyst -aspirated  . JOINT REPLACEMENT    . KNEE ARTHROSCOPY Right 1987; 1991; 10/2010  . METACARPOPHALANGEAL JOINT ARTHROPLASTY Bilateral 2013   bilateral thumbs- Gramig  . RECTAL PROLAPSE REPAIR  MAY 2015  . SHOULDER ARTHROSCOPY Bilateral 2004   "? side; cleanout"  . TONSILLECTOMY    . TOTAL KNEE ARTHROPLASTY Left 01/10/2015   Procedure: LEFT TOTAL KNEE ARTHROPLASTY;  Surgeon: [name], MD;  [facility];  Service: Orthopedics;  Laterality: Left;  . TOTAL KNEE ARTHROPLASTY Right 01/27/2018  . TOTAL KNEE ARTHROPLASTY Right 01/27/2018   Procedure: TOTAL KNEE ARTHROPLASTY;  Surgeon: Vickey Huger, MD;  Location: [facility];  Service: Orthopedics;  Laterality: Right;  . TUBAL  LIGATION  1977    There were no vitals filed for this visit.  Subjective Assessment - 03/25/18 1533    Subjective  Patient reports that she still feels stiff, still has the posterior knee pain    Currently in Pain?  No/denies         [facility] PT Assessment - 03/25/18 0001      AROM   Right Knee Flexion  95      PROM   Right Knee Flexion  108                   [facility] Adult PT Treatment/Exercise - 03/25/18 0001      Ambulation/Gait   Gait Comments  worked with her on gait having her bend and walk naturally , she tends to have a catch and then gets stiff legged      Knee/Hip Exercises: Aerobic   Elliptical  R 4 I 6 3 fwd/3 back    Recumbent Bike  5 minutes hip hike and pain the first 30 seconds, then smooths out      Knee/Hip Exercises: Machines for Strengthening   Cybex Leg Press  20# working on deeper flexion, both legs and then right only      Manual Therapy   Manual Therapy  Joint mobilization;Soft tissue mobilization;Passive ROM    Joint Mobilization  belt mobs for jt capsule  and flexion    Soft tissue mobilization  scar and patellar mobilizations pretty aggressive with her in flexion    Passive ROM  flexion and extension, had her supine with leg off the bed and then in prone               PT Short Term Goals - 02/11/18 1355      PT SHORT TERM GOAL #1   Title  independent with initial HEP    Status  Achieved        PT Long Term Goals - 03/25/18 1609      PT LONG TERM GOAL #3   Title  walk without deviation all distances    Status  Partially Met      PT LONG TERM GOAL #4   Title  increase ROM of the right knee to 0-120 degrees flexion    Status  Partially Met      PT LONG TERM GOAL #5   Title  go up and down stairs reciprocally    Status  Partially Met            Plan - 03/25/18 1608    Clinical Impression Statement  Patient had PROM that stayed, but her active ROM she lost 5 degrees on her two weeks without PT, she still has a  catch in the back of the knee at times that is painful.  She needs a lot of cues to walk correctly, I focused more on ROM and STM today since she has been doing more at the gym on her own    PT Next Visit Plan  continue to work on her ROM and her gait    Consulted and Agree with Plan of Care  Patient       Patient will benefit from skilled therapeutic intervention in order to improve the following deficits and impairments:  Abnormal gait, Decreased activity tolerance, Decreased balance, Decreased mobility, Decreased strength, Increased edema, Impaired flexibility, Pain, Increased muscle spasms, Difficulty walking, Decreased range of motion  Visit Diagnosis: Acute pain of right knee  Knee stiffness, right  Stiffness of right knee, not elsewhere classified  Difficulty in walking, not elsewhere classified     Problem List Patient Active Problem List   Diagnosis Date Noted  . S/P total knee replacement 01/27/2018  . Abnormal ECG 05/03/2015  . 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 OA (osteoarthritis) of knee 01/10/2015  . Arthritis of knee, degenerative 01/10/2015  . Bursitis, hip 01/08/2014  . Ecchymosis 12/29/2013  . Annual physical exam 11/14/2012  . FECAL INCONTINENCE 03/14/2010  . PERSONAL HX COLONIC POLYPS 03/14/2010  . ARTHRITIS, HANDS, BILATERAL 11/15/2009  . ARTHRITIS, KNEES, BILATERAL 12/07/2008  . HYPOTHYROIDISM 09/28/2008  . 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 OSTEOPENIA 09/28/2008    Sumner Boast., PT 03/25/2018, 4:10 PM  [place], [place], [address] Phone: [phone]   Fax:  [phone]  Name: Erin Carroll MRN: [id number] Date of Birth: June 25, 1946

## 2018-03-27 ENCOUNTER — Ambulatory Visit: Payer: Medicare Other | Admitting: Physical Therapy

## 2018-04-01 ENCOUNTER — Ambulatory Visit: Payer: Medicare Other | Admitting: Physical Therapy

## 2018-04-03 ENCOUNTER — Ambulatory Visit: Payer: Medicare Other | Attending: Orthopedic Surgery | Admitting: Physical Therapy

## 2018-04-03 ENCOUNTER — Encounter: Payer: Self-pay | Admitting: Physical Therapy

## 2018-04-03 DIAGNOSIS — M25561 Pain in right knee: Secondary | ICD-10-CM | POA: Insufficient documentation

## 2018-04-03 DIAGNOSIS — M25661 Stiffness of right knee, not elsewhere classified: Secondary | ICD-10-CM | POA: Insufficient documentation

## 2018-04-03 DIAGNOSIS — R262 Difficulty in walking, not elsewhere classified: Secondary | ICD-10-CM

## 2018-04-03 NOTE — Therapy (Addendum)
[facility] [place], [place], [address] Phone: [phone]   Fax:  [phone]  Physical Therapy Treatment  Patient Details  Name: Erin Carroll MRN: [id number] Date of Birth: June 28, 1946 Referring Provider: Ronnie Derby   Encounter Date: 04/03/2018  PT End of Session - 04/03/18 1620    Visit Number  11    Date for PT Re-Evaluation  04/09/18    PT Start Time  1530    PT Stop Time  1615    PT Time Calculation (min)  45 min    Activity Tolerance  Patient tolerated treatment well    Behavior During Therapy  [facility] for tasks assessed/performed       Past Medical History:  Diagnosis Date  . Alcoholism /alcohol abuse ([place])    sober since 77  . Arthritis    "knees, hands, fingers" (01/27/2018)  . Hypothyroidism   . Migraine 1979  . Thyroid disease    problems with thyroid    Past Surgical History:  Procedure Laterality Date  . BREAST BIOPSY Left 1977  . COLONOSCOPY    . EXCISION/RELEASE BURSA HIP Left 01/08/2014   Procedure: LEFT HIP BURSECTOMY WITH GLUTEAL TENDON REPAIR;  Surgeon: [name], MD;  Location: [facility];  Service: Orthopedics;  Laterality: Left;  With Anchors  . FINGER SURGERY Left 12-31-13   left middle finger cyst -aspirated  . JOINT REPLACEMENT    . KNEE ARTHROSCOPY Right 1987; 1991; 10/2010  . METACARPOPHALANGEAL JOINT ARTHROPLASTY Bilateral 2013   bilateral thumbs- [place]  . RECTAL PROLAPSE REPAIR  MAY 2015  . SHOULDER ARTHROSCOPY Bilateral 2004   "? side; cleanout"  . TONSILLECTOMY    . TOTAL KNEE ARTHROPLASTY Left 01/10/2015   Procedure: LEFT TOTAL KNEE ARTHROPLASTY;  Surgeon: [name], MD;  Location: [facility];  Service: Orthopedics;  Laterality: Left;  . TOTAL KNEE ARTHROPLASTY Right 01/27/2018  . TOTAL KNEE ARTHROPLASTY Right 01/27/2018   Procedure: TOTAL KNEE ARTHROPLASTY;  Surgeon: Vickey Huger, MD;  Location: [facility];  Service: Orthopedics;  Laterality: Right;  . TUBAL  LIGATION  1977    There were no vitals filed for this visit.  Subjective Assessment - 04/03/18 1534    Subjective  Patient reports that after any sitting she will get stiff.  Reports pain is about the same with the catch    Currently in Pain?  Yes    Pain Score  2     Pain Location  Knee    Pain Orientation  Right;Medial    Pain Descriptors / Indicators  Aching    Aggravating Factors   after sitting it is stiff         [facility] PT Assessment - 04/03/18 0001      AROM   Right Knee Flexion  104 measured after stretches                   [facility] Adult PT Treatment/Exercise - 04/03/18 0001      Knee/Hip Exercises: Aerobic   Elliptical  R 4 I 6 3 fwd/3 back    Recumbent Bike  5 minutes hip hike and pain the first 30 seconds, then smooths out      Knee/Hip Exercises: Standing   Lateral Step Up  Step Height: 4";20 reps;Limitations    Lateral Step Up Limitations  heel touches cues to decrease compensation    Step Down  Step Height: 4";20 reps;Limitations    Step Down Limitations  working on control and decresing hip hike, needs a lot of cues      Knee/Hip Exercises: Seated   Sit to Sand  20 reps;without UE support 14" seat height      Manual Therapy   Manual Therapy  Joint mobilization;Soft tissue mobilization;Passive ROM    Joint Mobilization  belt mobs for jt capsule and flexion    Soft tissue mobilization  scar and patellar mobilizations pretty aggressive with her in flexion    Passive ROM  flexion and extension, had her supine with leg off the bed and then in prone               PT Short Term Goals - 02/11/18 1355      PT SHORT TERM GOAL #1   Title  independent with initial HEP    Status  Achieved        PT Long Term Goals - 04/03/18 1624      PT LONG TERM GOAL #3   Title  walk without deviation all distances    Status  Partially Met      PT LONG TERM GOAL #4   Title  increase ROM of the right knee to 0-120 degrees flexion    Status  Partially  Met      PT LONG TERM GOAL #5   Title  go up and down stairs reciprocally    Status  Partially Met            Plan - 04/03/18 1620    Clinical Impression Statement  Patient with better motions, she is frustrated with the medial knee pain.  She reports difficulty sleeping.  She is demonstrating walking better, she has difficulty on the bike at seat position #4 with hip hike the first 3-4 rounds, but was able to move seat up to position #2 without hike.  She had difficulty with a deep squat.  Asked her to do HEP to include deep squat while holding onto the door and to do fron and laterl step downs touching heel only    PT Next Visit Plan  Will work on the ROM, see if her new exercises help    Consulted and Agree with Plan of Care  Patient       Patient will benefit from skilled therapeutic intervention in order to improve the following deficits and impairments:  Abnormal gait, Decreased activity tolerance, Decreased balance, Decreased mobility, Decreased strength, Increased edema, Impaired flexibility, Pain, Increased muscle spasms, Difficulty walking, Decreased range of motion  Visit Diagnosis: Acute pain of right knee  Stiffness of right knee, not elsewhere classified  Difficulty in walking, not elsewhere classified   Pateint reports that she is working on her own and doing well, requests D/C  Problem List Patient Active Problem List   Diagnosis Date Noted  . S/P total knee replacement 01/27/2018  . Abnormal ECG 05/03/2015  . 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 OA (osteoarthritis) of knee 01/10/2015  . Arthritis of knee, degenerative 01/10/2015  . Bursitis, hip 01/08/2014  . Ecchymosis 12/29/2013  . Annual physical exam 11/14/2012  . FECAL INCONTINENCE 03/14/2010  . PERSONAL HX COLONIC POLYPS 03/14/2010  . ARTHRITIS, HANDS, BILATERAL 11/15/2009  . ARTHRITIS, KNEES, BILATERAL 12/07/2008  . HYPOTHYROIDISM 09/28/2008  . ARTHRITIS, CERVICAL SPINE 09/28/2008   . OSTEOPENIA 09/28/2008    Sumner Boast., PT 04/03/2018, 4:26 PM  [address] [place], [place], [address] Phone: [phone]  Fax:  [phone]  Name: Erin Carroll MRN: [id number] Date of Birth: [birth year]

## 2021-05-29 ENCOUNTER — Telehealth: Payer: Self-pay

## 2021-05-29 ENCOUNTER — Other Ambulatory Visit: Payer: Self-pay | Admitting: Orthopedic Surgery

## 2021-05-29 DIAGNOSIS — G8929 Other chronic pain: Secondary | ICD-10-CM

## 2021-05-29 NOTE — Progress Notes (Signed)
Phone call to patient to discuss order for intradiscal injection. Reviewed pts allergies and most recent weight. Pt was advised not to take oral antibiotics for this procedure that we would give her IV antibiotics prior. Discharge instructions also reviewed with patient and instructed patient to have a driver the day of the procedure. Pt verbalized understanding.   

## 2021-06-06 ENCOUNTER — Other Ambulatory Visit: Payer: Self-pay | Admitting: Orthopedic Surgery

## 2021-06-06 ENCOUNTER — Ambulatory Visit
Admission: RE | Admit: 2021-06-06 | Discharge: 2021-06-06 | Disposition: A | Discharge status: Home / Self Care | Payer: Self-pay | Source: Ambulatory Visit | Attending: Orthopedic Surgery | Admitting: Orthopedic Surgery

## 2021-06-06 DIAGNOSIS — M545 Low back pain, unspecified: Secondary | ICD-10-CM

## 2021-06-06 DIAGNOSIS — G8929 Other chronic pain: Secondary | ICD-10-CM

## 2021-06-07 ENCOUNTER — Ambulatory Visit
Admission: RE | Admit: 2021-06-07 | Discharge: 2021-06-07 | Disposition: A | Discharge status: Home / Self Care | Payer: Medicare Other | Source: Ambulatory Visit | Attending: Orthopedic Surgery | Admitting: Orthopedic Surgery

## 2021-06-07 ENCOUNTER — Other Ambulatory Visit: Payer: Self-pay

## 2021-06-07 DIAGNOSIS — M545 Low back pain, unspecified: Secondary | ICD-10-CM

## 2021-06-07 DIAGNOSIS — G8929 Other chronic pain: Secondary | ICD-10-CM

## 2021-06-07 MED ORDER — CEFAZOLIN SODIUM-DEXTROSE 2-4 GM/100ML-% IV SOLN
2.0000 g | Freq: Once | INTRAVENOUS | Status: AC
  Administered 2021-06-07: 2 g via INTRAVENOUS

## 2021-06-07 NOTE — Discharge Instructions (Addendum)
Intra-Discal Anesthetic Injection Discharge Instruction Sheet  You may resume a regular diet and any medications that you routinely take (including pain medications).  No driving day of procedure.  Light activity throughout the rest of the day.  Do not do any strenuous work, exercise, bending or lifting.  The day following the procedure, you can resume normal physical activity but you should refrain from exercising or physical therapy for at least three days thereafter.   Please contact our office at [phone] for the following symptoms: Fever greater than 100 degrees. Headaches unresolved with medication after 2-3 days. 

## 2022-01-05 ENCOUNTER — Other Ambulatory Visit: Payer: Self-pay

## 2022-01-05 ENCOUNTER — Encounter (HOSPITAL_BASED_OUTPATIENT_CLINIC_OR_DEPARTMENT_OTHER): Payer: Self-pay | Admitting: *Deleted

## 2022-01-05 ENCOUNTER — Emergency Department (HOSPITAL_BASED_OUTPATIENT_CLINIC_OR_DEPARTMENT_OTHER)
Admission: EM | Admit: 2022-01-05 | Discharge: 2022-01-05 | Disposition: A | Discharge status: Home / Self Care | Payer: Medicare Other | Attending: Emergency Medicine | Admitting: Emergency Medicine

## 2022-01-05 ENCOUNTER — Emergency Department (HOSPITAL_BASED_OUTPATIENT_CLINIC_OR_DEPARTMENT_OTHER): Payer: Medicare Other

## 2022-01-05 DIAGNOSIS — Z79899 Other long term (current) drug therapy: Secondary | ICD-10-CM | POA: Insufficient documentation

## 2022-01-05 DIAGNOSIS — Z7982 Long term (current) use of aspirin: Secondary | ICD-10-CM | POA: Diagnosis not present

## 2022-01-05 DIAGNOSIS — R079 Chest pain, unspecified: Secondary | ICD-10-CM

## 2022-01-05 DIAGNOSIS — R072 Precordial pain: Secondary | ICD-10-CM | POA: Insufficient documentation

## 2022-01-05 LAB — CBC
HCT: 39 % (ref 36.0–46.0)
Hemoglobin: 12.9 g/dL (ref 12.0–15.0)
MCH: 31.4 pg (ref 26.0–34.0)
MCHC: 33.1 g/dL (ref 30.0–36.0)
MCV: 94.9 fL (ref 80.0–100.0)
Platelets: 259 10*3/uL (ref 150–400)
RBC: 4.11 MIL/uL (ref 3.87–5.11)
RDW: 14 % (ref 11.5–15.5)
WBC: 7.8 10*3/uL (ref 4.0–10.5)
nRBC: 0 % (ref 0.0–0.2)

## 2022-01-05 LAB — TROPONIN I (HIGH SENSITIVITY)
Troponin I (High Sensitivity): 4 ng/L (ref <18)
Troponin I (High Sensitivity): 4 ng/L (ref <18)

## 2022-01-05 LAB — HEPATIC FUNCTION PANEL
ALT: 25 U/L (ref 0–44)
AST: 34 U/L (ref 15–41)
Albumin: 4.1 g/dL (ref 3.5–5.0)
Alkaline Phosphatase: 67 U/L (ref 38–126)
Bilirubin, Direct: 0.2 mg/dL (ref 0.0–0.2)
Indirect Bilirubin: 0.4 mg/dL (ref 0.3–0.9)
Total Bilirubin: 0.6 mg/dL (ref 0.3–1.2)
Total Protein: 6.4 g/dL — ABNORMAL LOW (ref 6.5–8.1)

## 2022-01-05 LAB — BASIC METABOLIC PANEL
Anion gap: 8 (ref 5–15)
BUN: 20 mg/dL (ref 8–23)
CO2: 27 mmol/L (ref 22–32)
Calcium: 8.9 mg/dL (ref 8.9–10.3)
Chloride: 105 mmol/L (ref 98–111)
Creatinine, Ser: 0.76 mg/dL (ref 0.44–1.00)
GFR, Estimated: >60 mL/min (ref >60)
Glucose, Bld: 96 mg/dL (ref 70–99)
Potassium: 3.9 mmol/L (ref 3.5–5.1)
Sodium: 140 mmol/L (ref 135–145)

## 2022-01-05 LAB — LIPASE, BLOOD: Lipase: 35 U/L (ref 11–51)

## 2022-01-05 MED ORDER — LIDOCAINE VISCOUS HCL 2 % MT SOLN
15.0000 mL | Freq: Once | OROMUCOSAL | Status: AC
  Administered 2022-01-05: 15 mL via ORAL
  Filled 2022-01-05: qty 15

## 2022-01-05 MED ORDER — NITROGLYCERIN 0.4 MG SL SUBL
0.4000 mg | SUBLINGUAL_TABLET | Freq: Once | SUBLINGUAL | Status: AC
  Administered 2022-01-05: 0.4 mg via SUBLINGUAL
  Filled 2022-01-05: qty 1

## 2022-01-05 MED ORDER — ALUM & MAG HYDROXIDE-SIMETH 200-200-20 MG/5ML PO SUSP
30.0000 mL | Freq: Once | ORAL | Status: AC
  Administered 2022-01-05: 30 mL via ORAL
  Filled 2022-01-05: qty 30

## 2022-01-05 NOTE — Discharge Instructions (Addendum)
Your work-up today was very reassuring, your cardiac work-up was negative.  Follow-up with cardiology for further cardiac work-up as discussed.  If you develop worsening symptoms or progressive symptoms please return for evaluation.  Touch base with your primary care doctor as well.

## 2022-01-05 NOTE — ED Provider Notes (Signed)
[facility] EMERGENCY DEPARTMENT Provider Note   CSN: [id number] Arrival date & time: 01/05/22  1311     History  Chief Complaint  Patient presents with   Chest Pain    Erin Carroll is a 76 y.o. female.  The history is provided by the patient.  Chest Pain Pain location:  Substernal area Pain quality: aching   Pain radiates to:  Does not radiate Pain severity:  Mild Onset quality:  Gradual Duration:  6 hours Timing:  Constant Progression:  Unchanged Chronicity:  New Context: at rest   Relieved by:  Nothing Worsened by:  Nothing Associated symptoms: no abdominal pain, no anorexia, no anxiety, no back pain, no claudication, no cough and no diaphoresis   Risk factors: high cholesterol   Risk factors: no coronary artery disease, no diabetes mellitus, no hypertension, no prior DVT/PE and no smoking       Home Medications Prior to Admission medications   Medication Sig Start Date End Date Taking? Authorizing Provider  aspirin EC 325 MG EC tablet Take 1 tablet (325 mg total) by mouth 2 (two) times daily. 01/28/18   Donia Ast, PA  Biotin 5000 MCG CAPS Take 5,000 mcg by mouth daily.    [provider]  celecoxib (CELEBREX) 200 MG capsule Take 200 mg by mouth daily. 11/04/14   [provider]  chlorpheniramine (CHLOR-TRIMETON) 4 MG tablet Take 2 mg by mouth 2 (two) times daily as needed for allergies.    [provider]  gabapentin (NEURONTIN) 300 MG capsule Take 1 capsule (300 mg total) by mouth 3 (three) times daily as needed. 01/28/18   Donia Ast, PA  levothyroxine (SYNTHROID, LEVOTHROID) 75 MCG tablet Take 1 tablet (75 mcg total) by mouth daily before breakfast. Patient taking differently: Take 75 mcg by mouth daily before breakfast. Brand name. 02/08/14   Debbrah Alar, NP  methocarbamol (ROBAXIN) 500 MG tablet Take 1-2 tablets (500-1,000 mg total) by mouth every 6 (six) hours as needed for muscle spasms. 01/28/18    Donia Ast, PA  Multiple Vitamin (MULTIVITAMIN) tablet Take 1 tablet by mouth daily.    [provider]  oxyCODONE 10 MG TABS Take 1 tablet (10 mg total) by mouth every 4 (four) hours as needed for moderate pain ((score 4 to 6)). 01/28/18   Donia Ast, PA  phenylephrine (SUDAFED PE) 10 MG TABS tablet Take 10 mg by mouth 2 (two) times daily as needed (allergies).    [provider]  pyridOXINE (VITAMIN B-6) 100 MG tablet Take 100 mg by mouth daily.    [provider]      Allergies    Patient has no known allergies.    Review of Systems   Review of Systems  Constitutional:  Negative for diaphoresis.  Respiratory:  Negative for cough.   Cardiovascular:  Positive for chest pain. Negative for claudication.  Gastrointestinal:  Negative for abdominal pain and anorexia.  Musculoskeletal:  Negative for back pain.   Physical Exam Updated Vital Signs BP 128/74    Pulse (!) 56    Temp 98.1 F (36.7 C) (Oral)    Resp 20    Ht 5' 1.5" (1.562 m)    Wt 53.1 kg    SpO2 100%    BMI 21.75 kg/m  Physical Exam Vitals and nursing note reviewed.  Constitutional:      General: She is not in acute distress.    Appearance: She is well-developed. She is not  ill-appearing.  HENT:     Head: Normocephalic and atraumatic.  Eyes:     Extraocular Movements: Extraocular movements intact.     Conjunctiva/sclera: Conjunctivae normal.     Pupils: Pupils are equal, round, and reactive to light.  Cardiovascular:     Rate and Rhythm: Normal rate and regular rhythm.     Pulses:          Radial pulses are 2+ on the right side and 2+ on the left side.     Heart sounds: Normal heart sounds. No murmur heard. Pulmonary:     Effort: Pulmonary effort is normal. No respiratory distress.     Breath sounds: Normal breath sounds. No decreased breath sounds, wheezing or rhonchi.  Abdominal:     Palpations: Abdomen is soft.     Tenderness: There is no abdominal tenderness.   Musculoskeletal:        General: No swelling. Normal range of motion.     Cervical back: Normal range of motion and neck supple.     Right lower leg: No edema.     Left lower leg: No edema.  Skin:    General: Skin is warm and dry.     Capillary Refill: Capillary refill takes less than 2 seconds.  Neurological:     General: No focal deficit present.     Mental Status: She is alert.  Psychiatric:        Mood and Affect: Mood normal.    ED Results / Procedures / Treatments   Labs (all labs ordered are listed, but only abnormal results are displayed) Labs Reviewed  BASIC METABOLIC PANEL  CBC  TROPONIN I (HIGH SENSITIVITY)  TROPONIN I (HIGH SENSITIVITY)    EKG EKG Interpretation  Date/Time:  Friday January 05 2022 13:34:49 EST Ventricular Rate:  63 PR Interval:  170 QRS Duration: 101 QT Interval:  440 QTC Calculation: 454 R Axis:   -54 Text Interpretation: Sinus rhythm Left anterior fascicular block Confirmed by Lennice Sites ([id number]) on 01/05/2022 1:40:31 PM  Radiology DG Chest 2 View  Result Date: 01/05/2022 CLINICAL DATA:  Chest pain EXAM: CHEST - 2 VIEW COMPARISON:  Chest x-ray 07/01/2012 FINDINGS: Heart size is normal. Mediastinum appears stable. Tortuous thoracic aorta. Lungs are mildly hyperinflated. No focal consolidation identified. No pleural effusion or pneumothorax. IMPRESSION: No acute process identified. Electronically Signed   By: Ofilia Neas M.D.   On: 01/05/2022 13:30    Procedures Procedures    Medications Ordered in ED Medications  alum & mag hydroxide-simeth (MAALOX/MYLANTA) 200-200-20 MG/5ML suspension 30 mL (has no administration in time range)    And  lidocaine (XYLOCAINE) 2 % viscous mouth solution 15 mL (has no administration in time range)  nitroGLYCERIN (NITROSTAT) SL tablet 0.4 mg (0.4 mg Sublingual Given 01/05/22 1400)    ED Course/ Medical Decision Making/ A&P                           Medical Decision Making Amount and/or  Complexity of Data Reviewed Labs: ordered. Radiology: ordered.  Risk OTC drugs. Prescription drug management.   Erin Carroll is here with chest pain.  History of thyroid disease and arthritis and high cholesterol.  Vital signs overall unremarkable.  Around 8 AM this morning and about 6 hours ago she started to develop some discomfort in the middle of her chest.  She feels like she needs to burp.  She denies any exertional chest pressure as she has  been walk around today.  Does not radiate.  Not having any pain to her arms or jaw.  She walks several miles a day without any discomfort.  She states that she has a known bundle branch on her EKG which on EKG here today shows the same left fascicular block with no major ischemic changes.Erin Carroll is overall atypical.  She does not have any shortness of breath.  I have very low suspicion for pulmonary embolism.  Wells criteria 0 and doubt PE.  Differential includes acid reflux versus acute coronary syndrome versus costochondritis.  Given history and physical I am not concerned about dissection.  She is not have any abdominal pain have a lower suspicion for pancreatitis or cholecystitis.  She overall appears very comfortable.  We will get CBC, BMP, troponin, chest x-ray.  We will give a dose of nitroglycerin to see how she responds to that.  Then may consider GI cocktail. Heart Score in 4.   Patient states not much change with nitroglycerin may be lowered her pain from a 3 to a 2.  Per my review and interpretation of labs she has no significant anemia, electrolyte ab Mody, kidney injury.  Troponin is 4.  Chest x-ray per my interpretation shows no pneumonia or pneumothorax.  Overall she does have some cardiac risk factors and story is overall atypical.  Shared decision was made at this time to get a repeat troponin, check her liver enzymes, lipase and give her GI cocktail and reevaluate.  She prefers to be discharged and follow-up closely with cardiology which  I think would be reasonable.  Patient was handed off to oncoming ED staff with patient pending remaining work-up.  Please see their note for further results, evaluation, disposition of the patient.  This chart was dictated using voice recognition software.  Despite best efforts to proofread,  errors can occur which can change the documentation meaning.         Final Clinical Impression(s) / ED Diagnoses Final diagnoses:  Nonspecific chest pain    Rx / DC Orders ED Discharge Orders     None         Lennice Sites, DO 01/05/22 1450

## 2022-01-05 NOTE — ED Notes (Signed)
ED Provider at bedside. 

## 2022-01-05 NOTE — ED Provider Notes (Signed)
Patient signed out to me by previous provider. Please refer to their note for full HPI.  Briefly this is a 76 year old female who presented with mild chest achiness.  Presentation seems atypical for ACS.  Low suspicion for PE/dissection.  Work-up thus far is reassuring with a negative troponin.  We are pending repeat troponin and reevaluation. Physical Exam  BP 134/75    Pulse (!) 58    Temp 98.1 F (36.7 C) (Oral)    Resp (!) 22    Ht 5' 1.5" (1.562 m)    Wt 53.1 kg    SpO2 99%    BMI 21.75 kg/m   Physical Exam Vitals and nursing note reviewed.  Constitutional:      General: She is not in acute distress.    Appearance: Normal appearance. She is not diaphoretic.  HENT:     Head: Normocephalic.     Mouth/Throat:     Mouth: Mucous membranes are moist.  Cardiovascular:     Rate and Rhythm: Normal rate.  Pulmonary:     Effort: Pulmonary effort is normal. No respiratory distress.  Abdominal:     Palpations: Abdomen is soft.     Tenderness: There is no abdominal tenderness.  Skin:    General: Skin is warm.  Neurological:     Mental Status: She is alert and oriented to person, place, and time. Mental status is at baseline.  Psychiatric:        Mood and Affect: Mood normal.    Procedures  Procedures  ED Course / MDM    Medical Decision Making Amount and/or Complexity of Data Reviewed Labs: ordered. Radiology: ordered.  Risk OTC drugs. Prescription drug management.   Repeat troponin is negative with no delta.  Patient has no active symptoms, is very well-appearing.  Heart score is 3-4, however again atypical presentation and low suspicion for ACS.  Plan for outpatient cardiology follow-up.  Patient and spouse at bedside prefer for outpatient follow-up.  Strict return to ED precautions discussed.  Patient at this time appears safe and stable for discharge and close outpatient follow up. Discharge plan and strict return to ED precautions discussed, patient verbalizes understanding  and agreement.       Lorelle Gibbs, DO 01/05/22 1725

## 2022-01-05 NOTE — ED Triage Notes (Signed)
Chest pain. She was seen at [facility] this am and had a normal EKG.

## 2022-05-01 ENCOUNTER — Emergency Department (HOSPITAL_BASED_OUTPATIENT_CLINIC_OR_DEPARTMENT_OTHER): Payer: Medicare Other

## 2022-05-01 ENCOUNTER — Other Ambulatory Visit: Payer: Self-pay

## 2022-05-01 ENCOUNTER — Encounter (HOSPITAL_BASED_OUTPATIENT_CLINIC_OR_DEPARTMENT_OTHER): Payer: Self-pay

## 2022-05-01 ENCOUNTER — Emergency Department (HOSPITAL_BASED_OUTPATIENT_CLINIC_OR_DEPARTMENT_OTHER)
Admission: EM | Admit: 2022-05-01 | Discharge: 2022-05-01 | Disposition: A | Discharge status: Home / Self Care | Payer: Medicare Other | Attending: Emergency Medicine | Admitting: Emergency Medicine

## 2022-05-01 DIAGNOSIS — S0093XA Contusion of unspecified part of head, initial encounter: Secondary | ICD-10-CM

## 2022-05-01 DIAGNOSIS — S0181XA Laceration without foreign body of other part of head, initial encounter: Secondary | ICD-10-CM | POA: Insufficient documentation

## 2022-05-01 DIAGNOSIS — W19XXXA Unspecified fall, initial encounter: Secondary | ICD-10-CM | POA: Diagnosis not present

## 2022-05-01 DIAGNOSIS — M25531 Pain in right wrist: Secondary | ICD-10-CM | POA: Diagnosis not present

## 2022-05-01 DIAGNOSIS — T148XXA Other injury of unspecified body region, initial encounter: Secondary | ICD-10-CM

## 2022-05-01 DIAGNOSIS — S0990XA Unspecified injury of head, initial encounter: Secondary | ICD-10-CM

## 2022-05-01 DIAGNOSIS — S80211A Abrasion, right knee, initial encounter: Secondary | ICD-10-CM | POA: Insufficient documentation

## 2022-05-01 DIAGNOSIS — Z23 Encounter for immunization: Secondary | ICD-10-CM | POA: Diagnosis not present

## 2022-05-01 DIAGNOSIS — M25561 Pain in right knee: Secondary | ICD-10-CM

## 2022-05-01 DIAGNOSIS — Z7982 Long term (current) use of aspirin: Secondary | ICD-10-CM | POA: Insufficient documentation

## 2022-05-01 MED ORDER — TETANUS-DIPHTH-ACELL PERTUSSIS 5-2.5-18.5 LF-MCG/0.5 IM SUSY
0.5000 mL | PREFILLED_SYRINGE | Freq: Once | INTRAMUSCULAR | Status: AC
  Administered 2022-05-01: 0.5 mL via INTRAMUSCULAR
  Filled 2022-05-01: qty 0.5

## 2022-05-01 MED ORDER — IBUPROFEN 400 MG PO TABS
600.0000 mg | ORAL_TABLET | Freq: Once | ORAL | Status: AC
  Administered 2022-05-01: 600 mg via ORAL
  Filled 2022-05-01: qty 1

## 2022-05-01 MED ORDER — ACETAMINOPHEN 500 MG PO TABS
500.0000 mg | ORAL_TABLET | Freq: Once | ORAL | Status: AC
  Administered 2022-05-01: 500 mg via ORAL
  Filled 2022-05-01: qty 1

## 2022-05-01 NOTE — ED Notes (Signed)
Patient transported to CT 

## 2022-05-01 NOTE — Discharge Instructions (Signed)
Your history, exam, work-up today revealed likely soft tissue and musculoskeletal injury after the fall.  The CTs of your head and face did not show acute fractures or intracranial bleeding.  The wound was well-appearing and did not appear to need any glue, staples, or sutures.  Your exam was otherwise reassuring and the x-rays of the wrist and knee were also reassuring.  Please use the wrist immobilizer at home if your symptoms persist and use over-the-counter medications.  Please follow-up with orthopedics team if pain in the knee or wrist persist.  If any symptoms change or worsen acutely, please turn to the nearest emergency department.  Please rest and stay hydrated.

## 2022-05-01 NOTE — ED Triage Notes (Addendum)
Pt ambulatory to triage Pt reports walking dog and fell due to uneven pavement . Fell on right side. Reports right wrist pain and hit head above right brow. Abrasion to right brow Reports dizziness. No loss of consciousness and not blood thinners.

## 2022-05-01 NOTE — ED Provider Notes (Signed)
[facility] EMERGENCY DEPARTMENT Provider Note   CSN: [id number] Arrival date & time: 05/01/22  1706     History  Chief Complaint  Patient presents with   Erin Carroll is a 76 y.o. female.  The history is provided by the patient, the spouse and medical records. No language interpreter was used.  Fall This is a new problem. The current episode started 1 to 2 hours ago. The problem occurs rarely. The problem has not changed since onset.Associated symptoms include headaches. Pertinent negatives include no chest pain, no abdominal pain and no shortness of breath. Nothing aggravates the symptoms. Nothing relieves the symptoms. She has tried nothing for the symptoms. The treatment provided no relief.      Home Medications Prior to Admission medications   Medication Sig Start Date End Date Taking? Authorizing Provider  aspirin EC 325 MG EC tablet Take 1 tablet (325 mg total) by mouth 2 (two) times daily. 01/28/18   Donia Ast, PA  Biotin 5000 MCG CAPS Take 5,000 mcg by mouth daily.    [provider]  celecoxib (CELEBREX) 200 MG capsule Take 200 mg by mouth daily. 11/04/14   [provider]  chlorpheniramine (CHLOR-TRIMETON) 4 MG tablet Take 2 mg by mouth 2 (two) times daily as needed for allergies.    [provider]  gabapentin (NEURONTIN) 300 MG capsule Take 1 capsule (300 mg total) by mouth 3 (three) times daily as needed. 01/28/18   Donia Ast, PA  levothyroxine (SYNTHROID, LEVOTHROID) 75 MCG tablet Take 1 tablet (75 mcg total) by mouth daily before breakfast. Patient taking differently: Take 75 mcg by mouth daily before breakfast. Brand name. 02/08/14   Debbrah Alar, NP  methocarbamol (ROBAXIN) 500 MG tablet Take 1-2 tablets (500-1,000 mg total) by mouth every 6 (six) hours as needed for muscle spasms. 01/28/18   Donia Ast, PA  Multiple Vitamin (MULTIVITAMIN) tablet Take 1 tablet by mouth daily.     [provider]  oxyCODONE 10 MG TABS Take 1 tablet (10 mg total) by mouth every 4 (four) hours as needed for moderate pain ((score 4 to 6)). 01/28/18   Donia Ast, PA  phenylephrine (SUDAFED PE) 10 MG TABS tablet Take 10 mg by mouth 2 (two) times daily as needed (allergies).    [provider]  pyridOXINE (VITAMIN B-6) 100 MG tablet Take 100 mg by mouth daily.    [provider]      Allergies    Patient has no known allergies.    Review of Systems   Review of Systems  Constitutional:  Negative for chills, fatigue and fever.  HENT:  Negative for congestion.   Eyes:  Negative for pain and visual disturbance.  Respiratory:  Negative for cough, chest tightness and shortness of breath.   Cardiovascular:  Negative for chest pain.  Gastrointestinal:  Negative for abdominal distention, abdominal pain, constipation, diarrhea, nausea and vomiting.  Genitourinary:  Negative for flank pain.  Musculoskeletal:  Negative for back pain, neck pain and neck stiffness.  Neurological:  Positive for headaches. Negative for dizziness, facial asymmetry, weakness, light-headedness and numbness.  Psychiatric/Behavioral:  Negative for agitation and confusion.   All other systems reviewed and are negative.  Physical Exam Updated Vital Signs BP (!) 144/88 (BP Location: Left Arm)   Pulse 71   Temp 98.6 F (37 C) (Oral)   Resp 20   Ht '5\' 2"'$  (1.575 m)   Wt 51.7 kg  SpO2 98%   BMI 20.85 kg/m  Physical Exam Vitals and nursing note reviewed.  Constitutional:      General: She is not in acute distress.    Appearance: She is well-developed. She is not ill-appearing, toxic-appearing or diaphoretic.  HENT:     Head: Abrasion and laceration present. No raccoon eyes or Battle's sign.      Comments: Normal extraocular movements.  Pupils symmetric and reactive.  No evidence of acute entrapment.  No trismus.  Normal oropharyngeal exam.  No neck tenderness.  No nasal tenderness  or nasal septal hematoma seen.    Nose: No congestion or rhinorrhea.     Mouth/Throat:     Mouth: Mucous membranes are moist.     Pharynx: No oropharyngeal exudate or posterior oropharyngeal erythema.  Eyes:     Extraocular Movements: Extraocular movements intact.     Conjunctiva/sclera: Conjunctivae normal.     Pupils: Pupils are equal, round, and reactive to light.  Cardiovascular:     Rate and Rhythm: Normal rate and regular rhythm.     Heart sounds: No murmur heard. Pulmonary:     Effort: Pulmonary effort is normal. No respiratory distress.     Breath sounds: Normal breath sounds. No wheezing, rhonchi or rales.  Chest:     Chest wall: No tenderness.  Abdominal:     General: Abdomen is flat.     Palpations: Abdomen is soft.     Tenderness: There is no abdominal tenderness. There is no right CVA tenderness, left CVA tenderness, guarding or rebound.  Musculoskeletal:        General: Tenderness present. No swelling.     Right wrist: Tenderness present. No lacerations, bony tenderness, snuff box tenderness or crepitus. Normal range of motion. Normal pulse.     Cervical back: Neck supple. No tenderness.     Right knee: Tenderness present.     Right lower leg: No edema.     Left lower leg: No edema.     Comments: Abrasion with mild tenderness of the right knee.  Normal range of motion.  Good sensation, strength, pulses distally.  Skin:    General: Skin is warm and dry.     Capillary Refill: Capillary refill takes less than 2 seconds.     Findings: No erythema or rash.  Neurological:     General: No focal deficit present.     Mental Status: She is alert. Mental status is at baseline.     Sensory: No sensory deficit.     Motor: No weakness.  Psychiatric:        Mood and Affect: Mood normal.    ED Results / Procedures / Treatments   Labs (all labs ordered are listed, but only abnormal results are displayed) Labs Reviewed - No data to display  EKG None  Radiology DG Wrist  Complete Right  Result Date: 05/01/2022 CLINICAL DATA:  fall EXAM: RIGHT WRIST - COMPLETE 3+ VIEW COMPARISON:  None Available. FINDINGS: There is no evidence of fracture or dislocation. Chondrocalcinosis. Mild degenerative changes of the carpal bones. Degenerative changes of the visualized interphalangeal joints. Cystic changes of the distal ulna likely benign in etiology. No other focal bone abnormality. Soft tissues are unremarkable. IMPRESSION: 1. No acute displaced fracture or dislocation. 2. Cystic changes of the distal ulna likely benign in etiology. Electronically Signed   By: Iven Finn M.D.   On: 05/01/2022 18:11   CT Head Wo Contrast  Result Date: 05/01/2022 CLINICAL DATA:  Head trauma,  moderate-severe Fall, head injury, abrasion to right temporal area. headache; Facial trauma, blunt Fall with right temporal injury. Small abrasion. Headache. EXAM: CT HEAD WITHOUT CONTRAST CT MAXILLOFACIAL WITHOUT CONTRAST TECHNIQUE: Multidetector CT imaging of the head and maxillofacial structures were performed using the standard protocol without intravenous contrast. Multiplanar CT image reconstructions of the maxillofacial structures were also generated. RADIATION DOSE REDUCTION: This exam was performed according to the departmental dose-optimization program which includes automated exposure control, adjustment of the mA and/or kV according to patient size and/or use of iterative reconstruction technique. COMPARISON:  None Available. FINDINGS: CT HEAD FINDINGS BRAIN: BRAIN Cerebral ventricle sizes are concordant with the degree of cerebral volume loss. Patchy and confluent areas of decreased attenuation are noted throughout the deep and periventricular white matter of the cerebral hemispheres bilaterally, compatible with chronic microvascular ischemic disease. No evidence of large-territorial acute infarction. No parenchymal hemorrhage. No mass lesion. No extra-axial collection. No mass effect or midline  shift. No hydrocephalus. Basilar cisterns are patent. Vascular: No hyperdense vessel. Atherosclerotic calcifications are present within the cavernous internal carotid arteries. Skull: No acute fracture or focal lesion. Other: None. CT MAXILLOFACIAL FINDINGS Osseous: No fracture or mandibular dislocation. No destructive process. Sinuses/Orbits: Paranasal sinuses and mastoid air cells are clear. The orbits are unremarkable. Soft tissues: Negative. Other: Visualized upper cervical spine demonstrates grade 2 anterolisthesis of C3 on C4 and grade 1 anterolisthesis C4 on C5. Mild retrolisthesis C5 on C6. Grade 1 anterolisthesis of C7 on T1. Degenerative changes of the visualized cervical spine is noted. IMPRESSION: 1. No acute intracranial abnormality. 2. No acutely displaced facial fracture. 3. Partially visualized cervical spine demonstrates spondylolisthesis. Electronically Signed   By: Iven Finn M.D.   On: 05/01/2022 18:16   DG Knee Complete 4 Views Right  Result Date: 05/01/2022 CLINICAL DATA:  Fall, knee pain EXAM: RIGHT KNEE - COMPLETE 4+ VIEW COMPARISON:  None Available. FINDINGS: Right knee arthroplasty, without evidence of complication. No fracture or dislocation is seen. The visualized soft tissues are unremarkable. No suprapatellar knee joint effusion. IMPRESSION: Negative. Electronically Signed   By: Julian Hy M.D.   On: 05/01/2022 17:52   CT Maxillofacial Wo Contrast  Result Date: 05/01/2022 CLINICAL DATA:  Head trauma, moderate-severe Fall, head injury, abrasion to right temporal area. headache; Facial trauma, blunt Fall with right temporal injury. Small abrasion. Headache. EXAM: CT HEAD WITHOUT CONTRAST CT MAXILLOFACIAL WITHOUT CONTRAST TECHNIQUE: Multidetector CT imaging of the head and maxillofacial structures were performed using the standard protocol without intravenous contrast. Multiplanar CT image reconstructions of the maxillofacial structures were also generated. RADIATION  DOSE REDUCTION: This exam was performed according to the departmental dose-optimization program which includes automated exposure control, adjustment of the mA and/or kV according to patient size and/or use of iterative reconstruction technique. COMPARISON:  None Available. FINDINGS: CT HEAD FINDINGS BRAIN: BRAIN Cerebral ventricle sizes are concordant with the degree of cerebral volume loss. Patchy and confluent areas of decreased attenuation are noted throughout the deep and periventricular white matter of the cerebral hemispheres bilaterally, compatible with chronic microvascular ischemic disease. No evidence of large-territorial acute infarction. No parenchymal hemorrhage. No mass lesion. No extra-axial collection. No mass effect or midline shift. No hydrocephalus. Basilar cisterns are patent. Vascular: No hyperdense vessel. Atherosclerotic calcifications are present within the cavernous internal carotid arteries. Skull: No acute fracture or focal lesion. Other: None. CT MAXILLOFACIAL FINDINGS Osseous: No fracture or mandibular dislocation. No destructive process. Sinuses/Orbits: Paranasal sinuses and mastoid air cells are clear. The orbits are unremarkable.  Soft tissues: Negative. Other: Visualized upper cervical spine demonstrates grade 2 anterolisthesis of C3 on C4 and grade 1 anterolisthesis C4 on C5. Mild retrolisthesis C5 on C6. Grade 1 anterolisthesis of C7 on T1. Degenerative changes of the visualized cervical spine is noted. IMPRESSION: 1. No acute intracranial abnormality. 2. No acutely displaced facial fracture. 3. Partially visualized cervical spine demonstrates spondylolisthesis. Electronically Signed   By: Iven Finn M.D.   On: 05/01/2022 18:16    Procedures Procedures    Medications Ordered in ED Medications  Tdap (BOOSTRIX) injection 0.5 mL (0.5 mLs Intramuscular Given 05/01/22 1830)  acetaminophen (TYLENOL) tablet 500 mg (500 mg Oral Given 05/01/22 1824)  ibuprofen (ADVIL) tablet  600 mg (600 mg Oral Given 05/01/22 1824)    ED Course/ Medical Decision Making/ A&P                           Medical Decision Making Amount and/or Complexity of Data Reviewed Radiology: ordered.  Risk OTC drugs. Prescription drug management.    Erin Carroll is a 76 y.o. female with a past medical history significant for hypothyroidism, migraines, previous knee replacements, and osteopenia who presents with fall.  Patient reports that she was walking her poodle when she stepped onto some resurfaced pavement and lost her balance leading to her falling.  She reports hitting her extended right wrist, her right knee, and her right temporal area with head.  She denies loss of consciousness and denies nausea or vomiting.  She reports that her vision was not focusing as easily but she is denying any vision changes currently or nausea or vomiting.  She denies any speech difficulties.  Denies any neck pain or neck stiffness.  Denies any chest pain, abdominal pain, or back pain.  She is right-handed and is reporting pain in her right wrist.  She also is getting some mild abrasion and pain to her right knee although she denies any significant pain there at this time.  She reports the pain is up to a 6 out of 10 in severity in her head and wrist at the moment.  Denying any other complaints.  Denies any preceding symptoms.  On exam, lungs clear and chest nontender.  Abdomen nontender.  Back nontender.  Neck nontender.  Patient is some tenderness around the lateral right orbit where she has a small punctate abrasion/laceration that is hemostatic.  Pupils are symmetric and reactive with normal extract movements.  No evidence of entrapment.  No evidence of eye injury itself.  Normal vision reported.  No other laceration seen on the head or scalp.  Patient has tenderness in the right wrist with movement and palpation.  Intact cap refill, sensation, strength with grip.  No snuffbox tenderness.  Patient has small  abrasion to the right knee with some tenderness but otherwise normal range of motion of the knee.  Intact sensation, strength, pulse distally.  Rest of exam unremarkable.  Patient does not know when her last tetanus shot was we will update Tdap.  Patient will get x-ray of the knee, wrist, and then CT imaging of the head and face.  Given her lack of any neck pain, we agreed to hold on CT of the neck or other imaging at this time.  Anticipate reassessment after images.  She would like some Motrin and Tylenol start with.  7:07 PM Imaging overall reassuring.  Patient's wound was already washed out and is hemostatic.  Well approximated.  Do  not feel it would benefit from glue, strips, or sutures at this time.  We discussed all of the imaging results and feels safe for discharge home.  Patient will follow-up with her PCP and understood return precautions.  She had improvement in her headache after Tylenol and Motrin and would like to go home now.  We discussed the possibility of occult injury in the wrist and offered her a wrist brace but she reports she already has one at home that she can use if she needs it.  She understands to follow-up with her orthopedics team if wrist symptoms or knee symptoms persist.  Patient discharged in good condition after reassuring work-up.        Final Clinical Impression(s) / ED Diagnoses Final diagnoses:  Fall, initial encounter  Injury of head, initial encounter  Abrasion  Contusion of head, unspecified part of head, initial encounter  Right wrist pain  Acute pain of right knee    Rx / DC Orders ED Discharge Orders     None      Clinical Impression: 1. Fall, initial encounter   2. Injury of head, initial encounter   3. Abrasion   4. Contusion of head, unspecified part of head, initial encounter   5. Right wrist pain   6. Acute pain of right knee     Disposition: Discharge  Condition: Good  I have discussed the results, Dx and Tx plan  with the pt(& family if present). He/she/they expressed understanding and agree(s) with the plan. Discharge instructions discussed at great length. Strict return precautions discussed and pt &/or family have verbalized understanding of the instructions. No further questions at time of discharge.    New Prescriptions   No medications on file    Follow Up: Mariel Sleet [address] [place] [address] [phone]     your orthopedics team     [facility] [address] [id number] [place] [facility] [place]       Adalee Kathan, Gwenyth Allegra, MD 05/01/22 1910

## 2022-05-01 NOTE — ED Notes (Signed)
Edp at bedside °

## 2023-08-12 IMAGING — DX DG KNEE COMPLETE 4+V*R*
4 series · 4 of 4 positions shown · non-contrast
Comparison: None Available.

CLINICAL DATA: Fall, knee pain

EXAM:
RIGHT KNEE - COMPLETE 4+ VIEW

[knee ap]
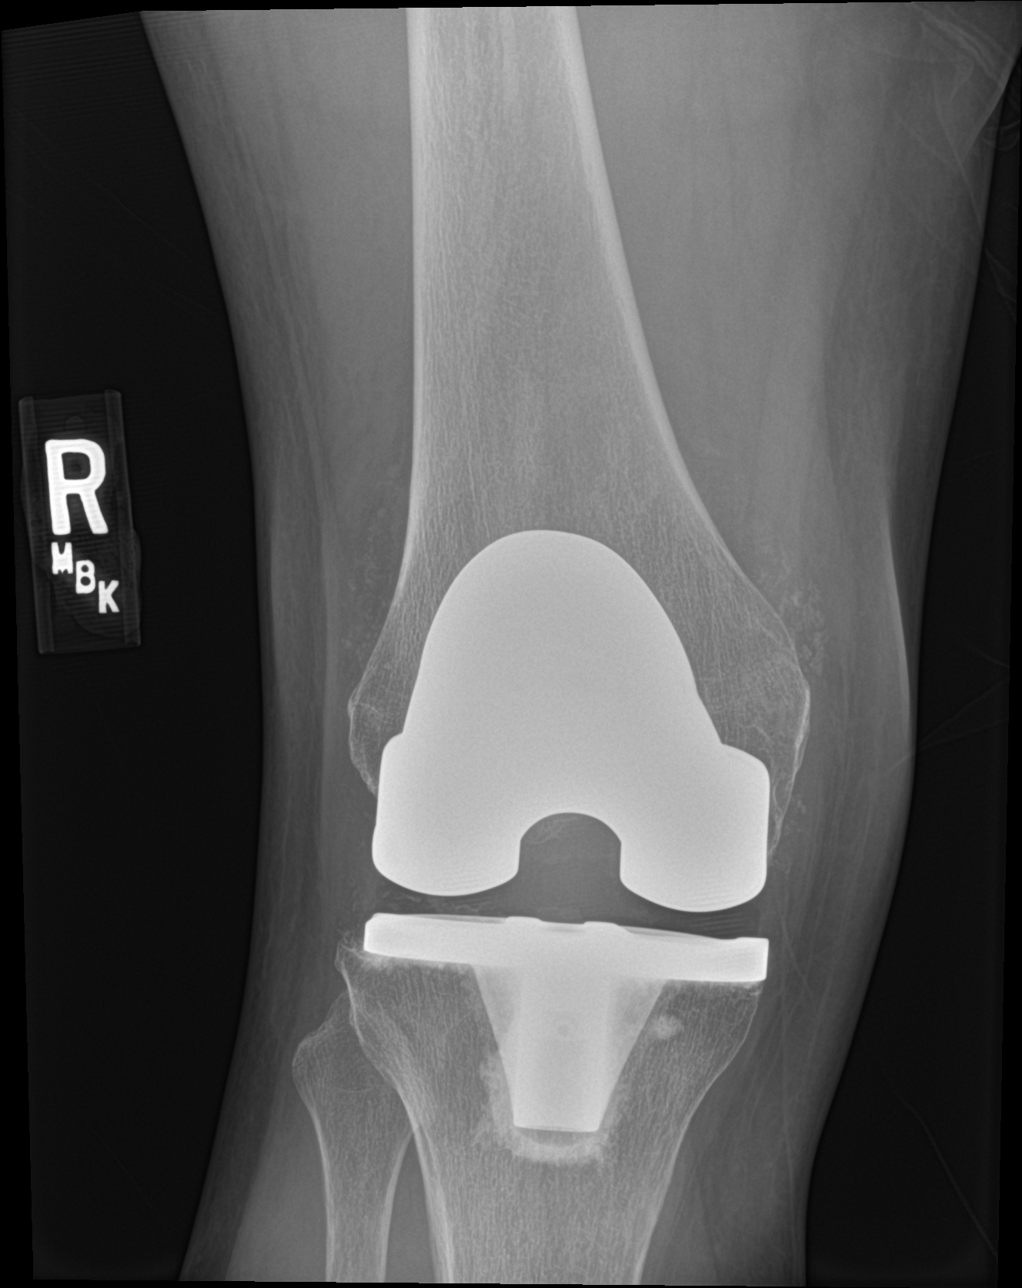

[knee lat]
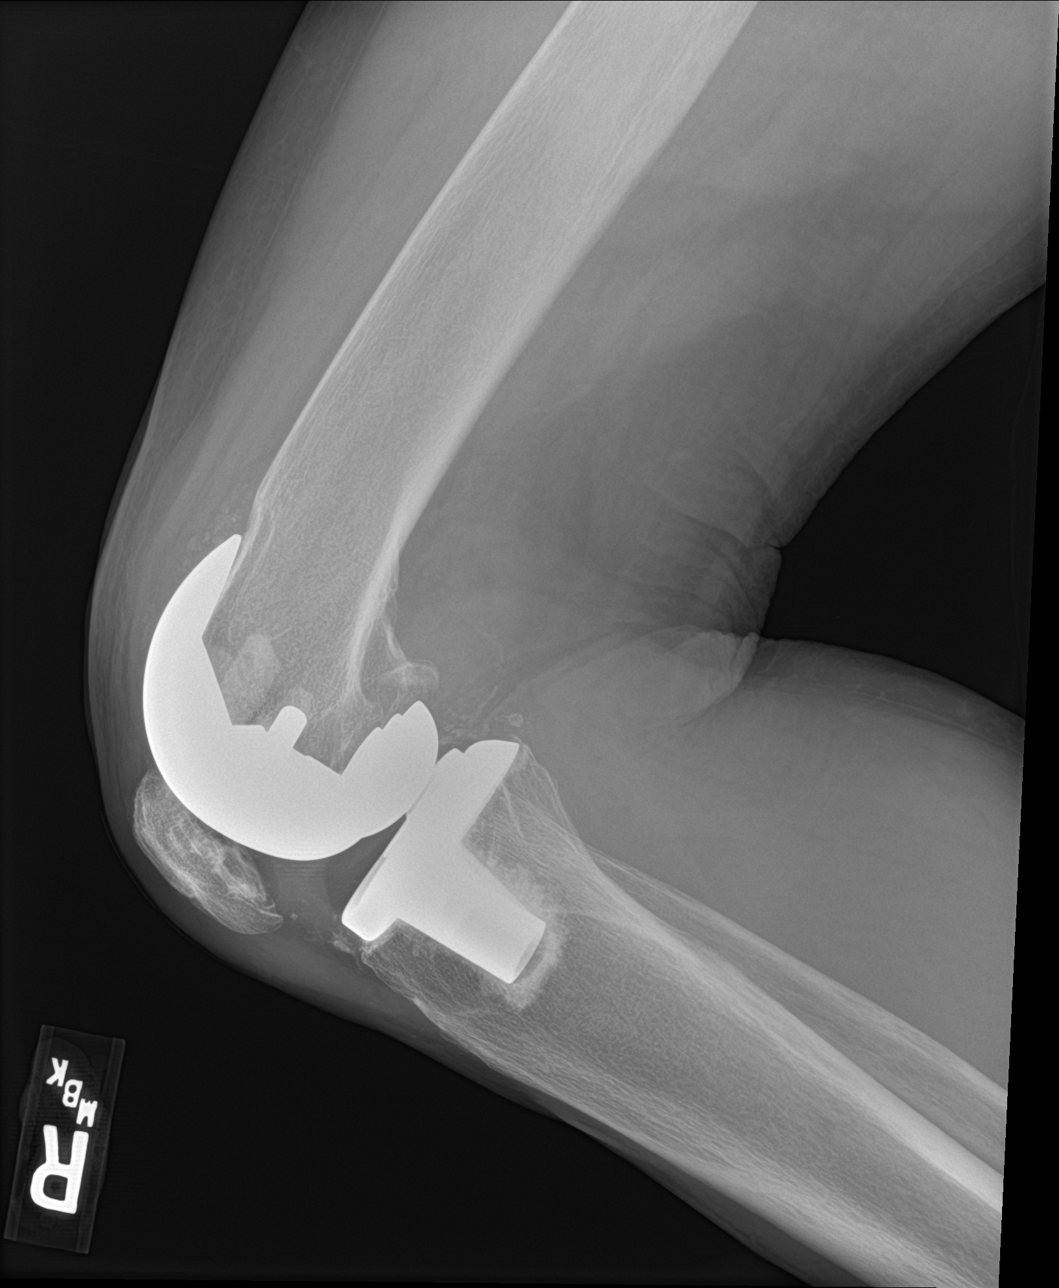

[knee obl (1 of 2)]
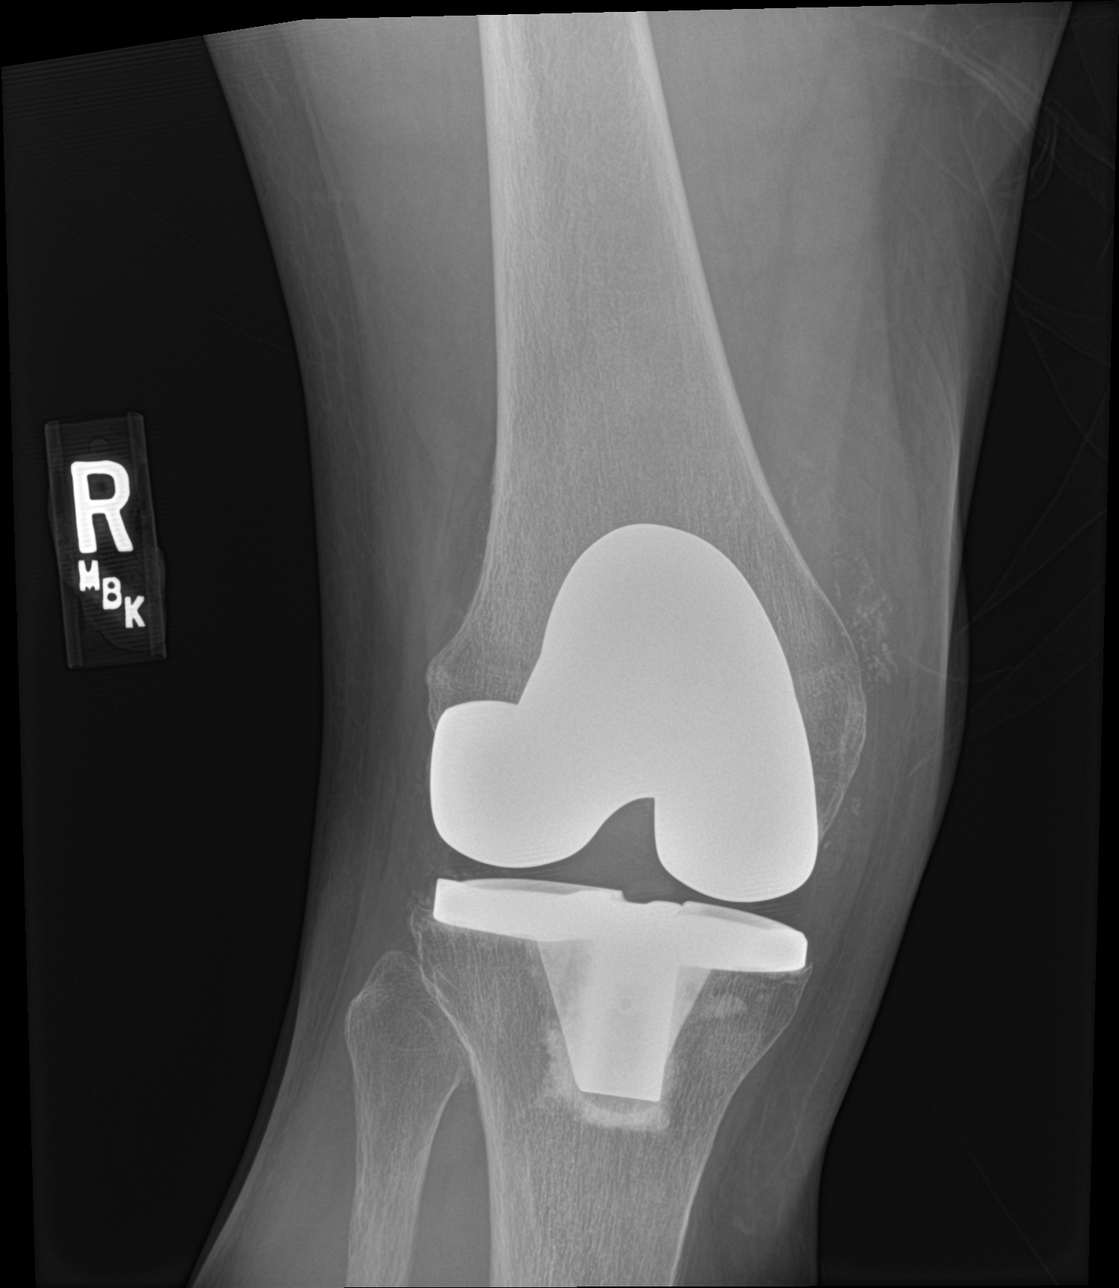

[knee obl (2 of 2)]
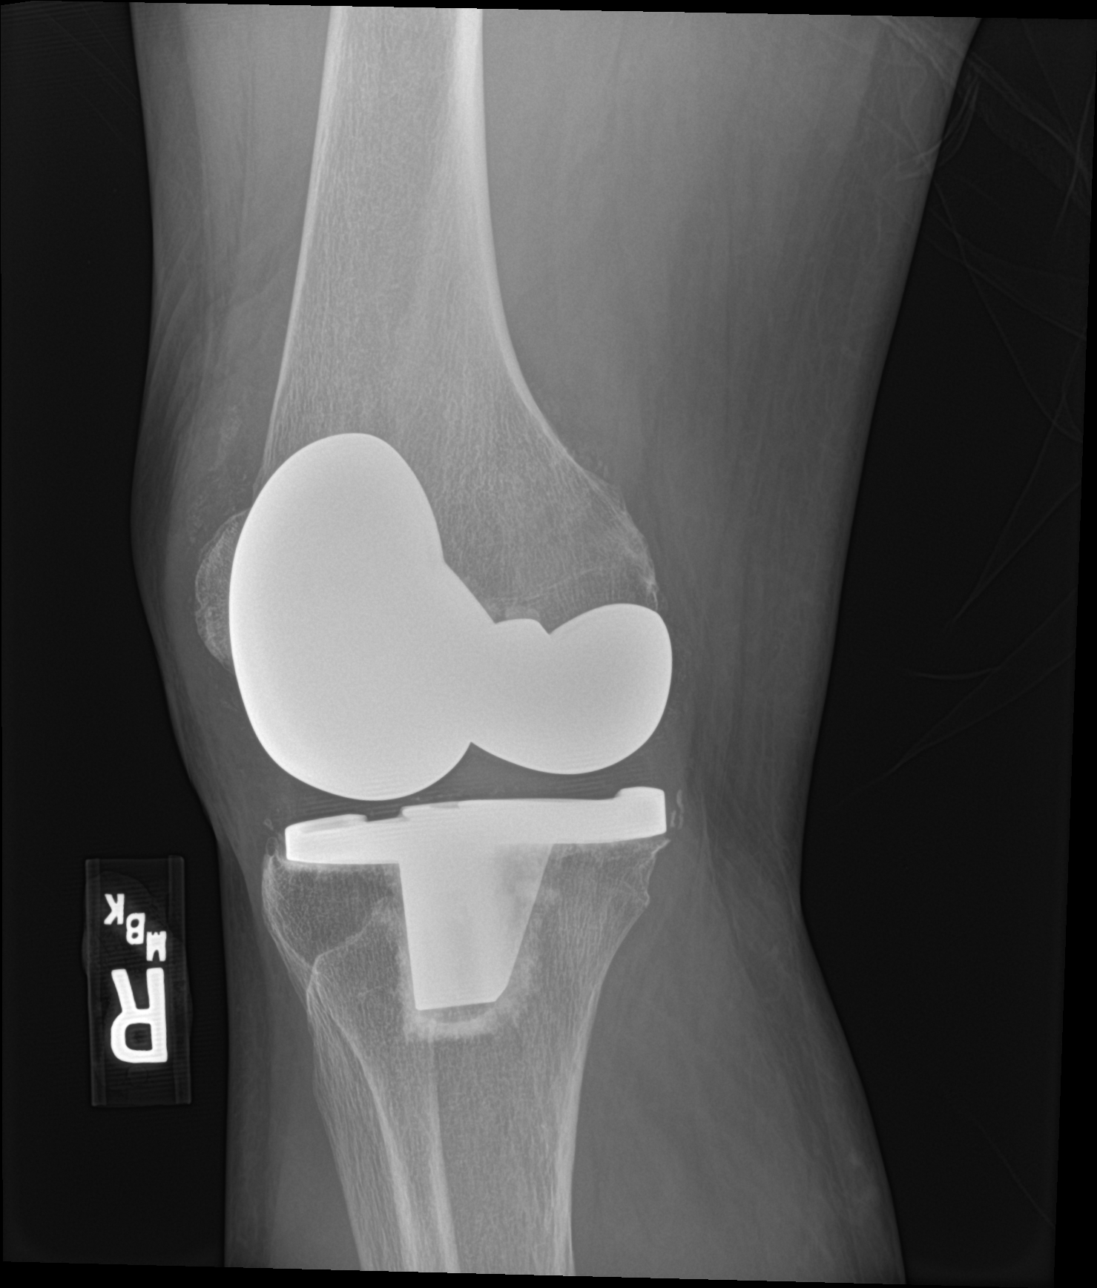

[4 of 4 positions shown; findings below may reference images not displayed]

FINDINGS: Right knee arthroplasty, without evidence of complication.

No fracture or dislocation is seen.

The visualized soft tissues are unremarkable.

No suprapatellar knee joint effusion.
IMPRESSION: Negative.

## 2023-08-12 IMAGING — CT CT HEAD W/O CM
3 series · 14 of 47 positions shown, 16 images · non-contrast
Comparison: None Available.

CLINICAL DATA: Head trauma, moderate-severe Fall, head injury,
abrasion to right temporal area. headache; Facial trauma, blunt Fall
with right temporal injury. Small abrasion. Headache.

EXAM:
CT HEAD WITHOUT CONTRAST
CT MAXILLOFACIAL WITHOUT CONTRAST
TECHNIQUE: Multidetector CT imaging of the head and maxillofacial structures
were performed using the standard protocol without intravenous
contrast. Multiplanar CT image reconstructions of the maxillofacial
structures were also generated.
RADIATION DOSE REDUCTION: This exam was performed according to the
departmental dose-optimization program which includes automated
exposure control, adjustment of the mA and/or kV according to
patient size and/or use of iterative reconstruction technique.

[Series 2: head wo · axial · 0.49mm/px · z∈[+706,+846]mm · 8 of 34 slices shown, 10 images]
[im 3/34  brain]
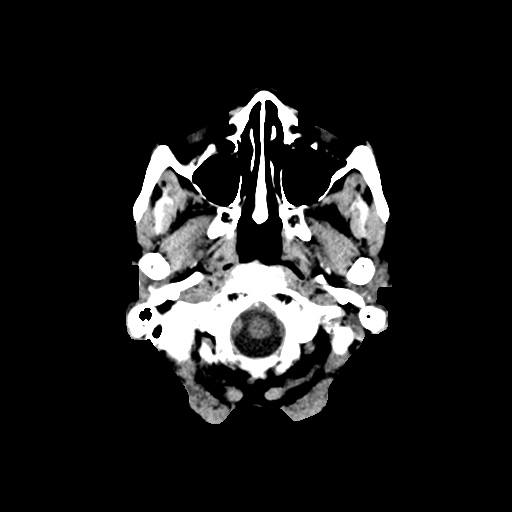
[im 3/34  bone]
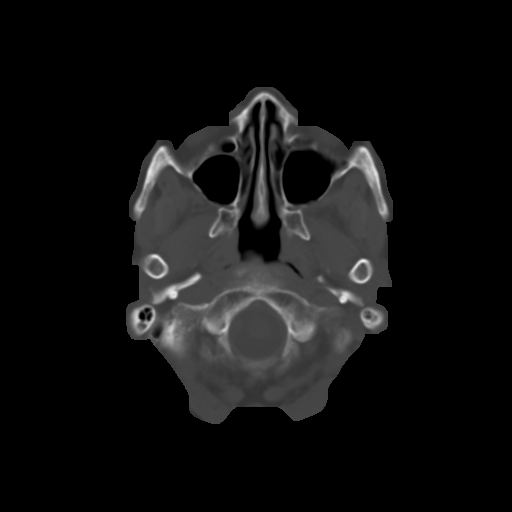
[im 7/34  brain]
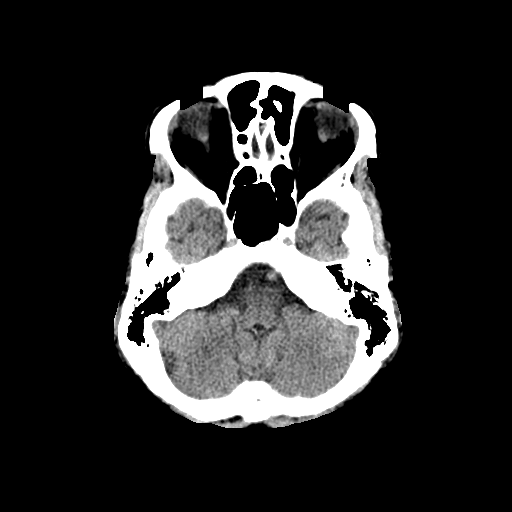
[im 11/34  brain]
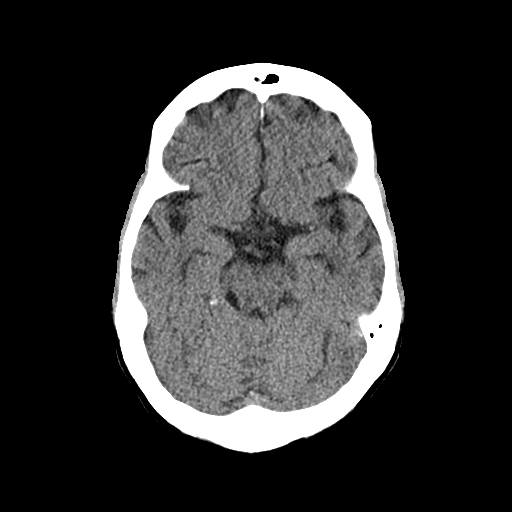
[im 15/34  brain]
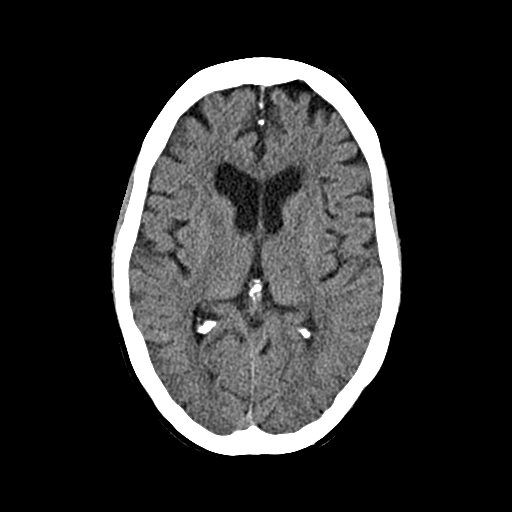
[im 19/34  brain]
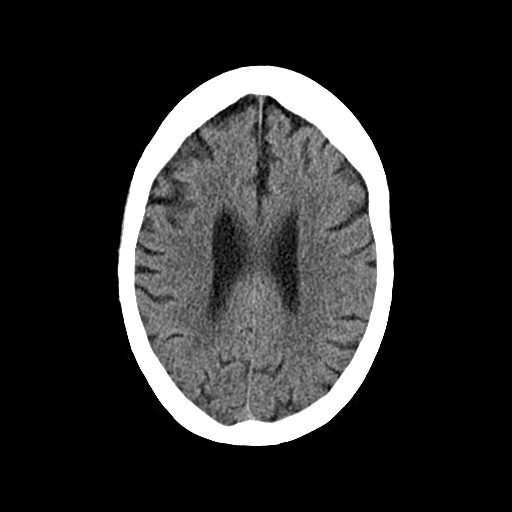
[im 19/34  bone]
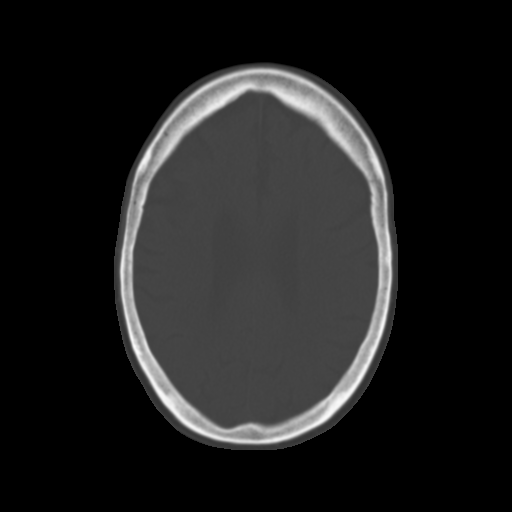
[im 23/34  brain]
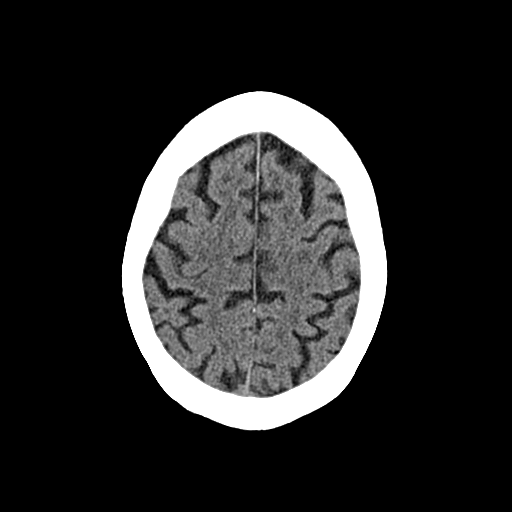
[im 27/34  brain]
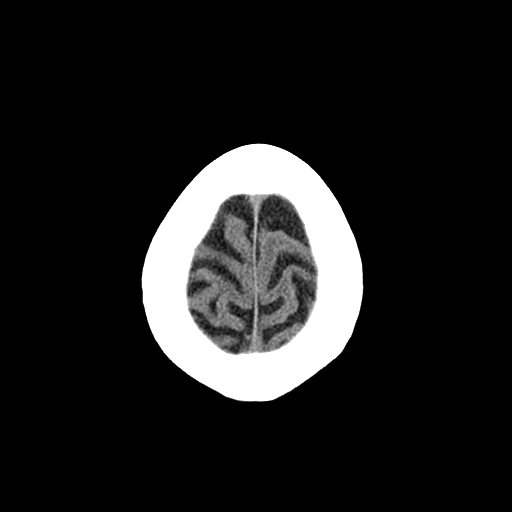
[im 31/34  brain]
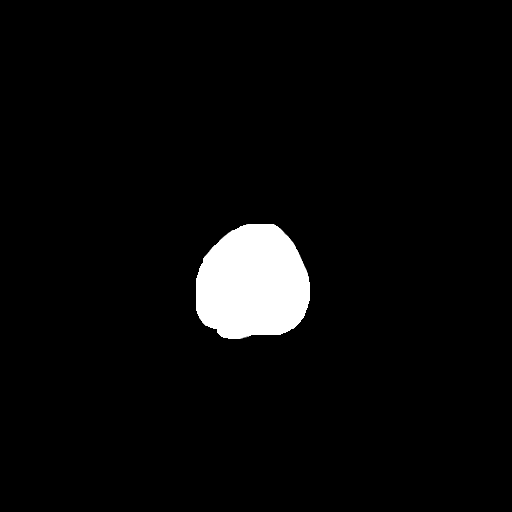

[Series 4: cor head wo · coronal · 0.33mm/px · 3 of 73 slices shown]
[im 25/73  brain]
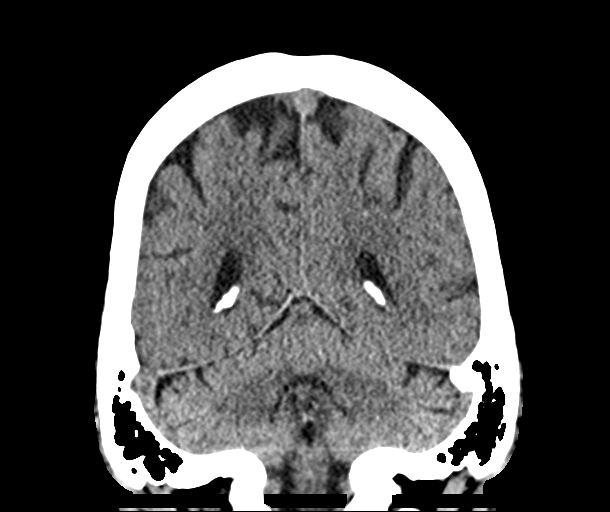
[im 33/73  brain]
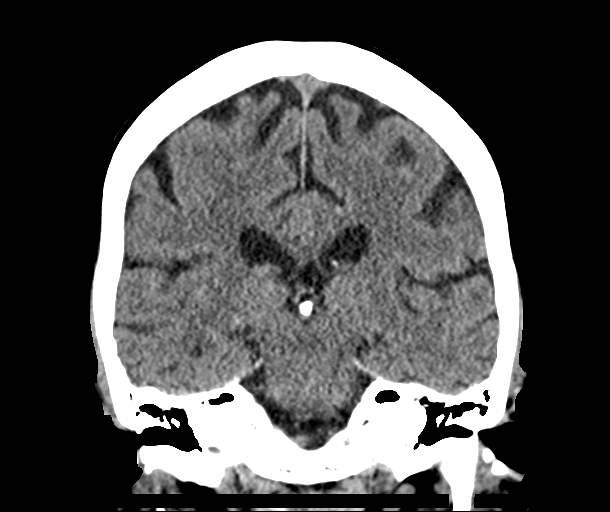
[im 41/73  brain]
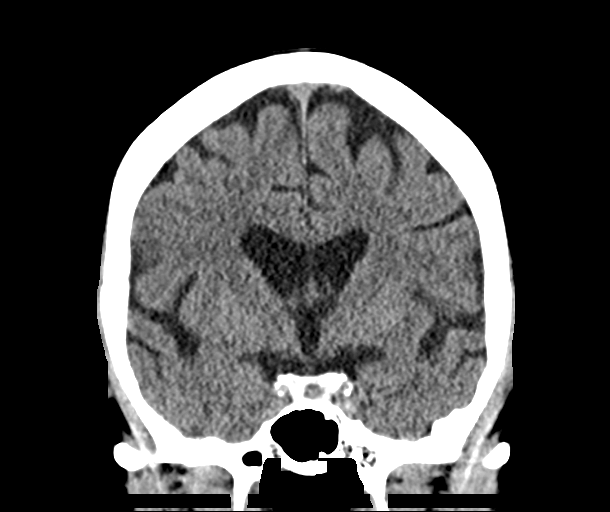

[Series 5: sag head wo · sagittal · 0.33mm/px · 3 of 62 slices shown]
[im 21/62  brain]
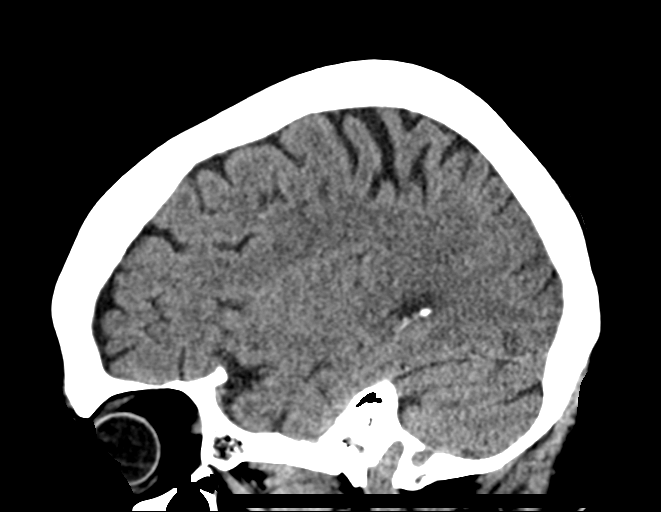
[im 31/62  brain]
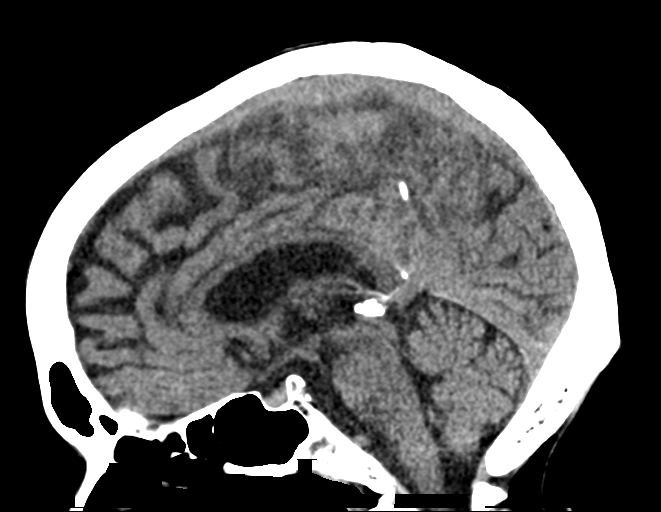
[im 41/62  brain]
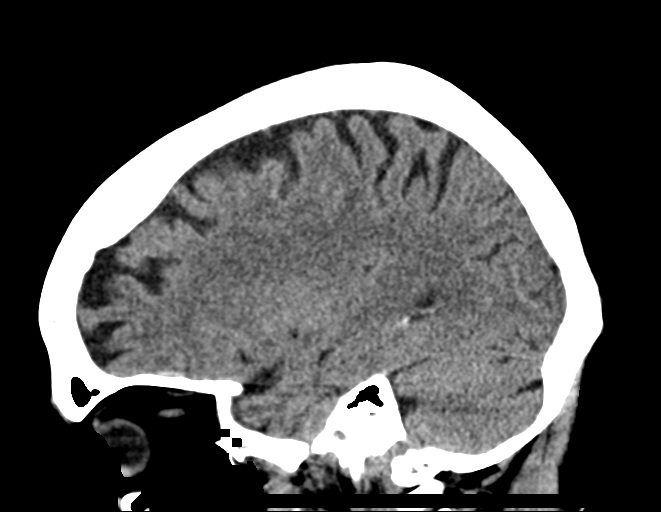

[14 of 47 positions shown; findings below may reference images not displayed]

FINDINGS: CT HEAD FINDINGS

BRAIN:
BRAIN
Cerebral ventricle sizes are concordant with the degree of cerebral
volume loss. Patchy and confluent areas of decreased attenuation are
noted throughout the deep and periventricular white matter of the
cerebral hemispheres bilaterally, compatible with chronic
microvascular ischemic disease.

No evidence of large-territorial acute infarction. No parenchymal
hemorrhage. No mass lesion. No extra-axial collection.

No mass effect or midline shift. No hydrocephalus. Basilar cisterns
are patent.

Vascular: No hyperdense vessel. Atherosclerotic calcifications are
present within the cavernous internal carotid arteries.

Skull: No acute fracture or focal lesion.

Other: None.

CT MAXILLOFACIAL FINDINGS

Osseous: No fracture or mandibular dislocation. No destructive
process.

Sinuses/Orbits: Paranasal sinuses and mastoid air cells are clear.
The orbits are unremarkable.

Soft tissues: Negative.

Other: Visualized upper cervical spine demonstrates grade 2
anterolisthesis of C3 on C4 and grade 1 anterolisthesis C4 on C5.
Mild retrolisthesis C5 on C6. Grade 1 anterolisthesis of C7 on T1.
Degenerative changes of the visualized cervical spine is noted.
IMPRESSION: 1. No acute intracranial abnormality.
2. No acutely displaced facial fracture.
3. Partially visualized cervical spine demonstrates
spondylolisthesis.

## 2023-10-17 LAB — HM DEXA SCAN

## 2023-11-04 NOTE — Progress Notes (Unsigned)
   Rubin Payor, PhD, LAT, ATC acting as a scribe for Clementeen Graham, MD.  SARAROSE CARN is a 77 y.o. female who presents to [facility] at [facility] today for osteoporosis management. Hx of a L1 compression fx and kyphoplasty on 05/15/23.  DEXA scan (date, T-score): 10/17/23: Spine= -1.9, L-FN= -2.8, R-FN= -2.4 Prior treatment: *** History of Hip, Spine, or Wrist Fx: yes Heart disease or stroke: *** Cancer: none Kidney Disease: *** Gastric/Peptic Ulcer: *** Gastric bypass surgery: *** Severe GERD: *** Hx of seizures: *** Age at Menopause: *** Calcium intake: *** Vitamin D intake: *** Hormone replacement therapy: estradiol, Smoking history: *** Alcohol: *** Exercise: *** Major dental work in past year: *** Parents with hip/spine fracture: *** Height loss: ***      Pertinent review of systems: ***  Relevant historical information: ***   Exam:  There were no vitals taken for this visit. General: Well Developed, well nourished, and in no acute distress.   MSK: ***    Lab and Radiology Results No results found for this or any previous visit (from the past 72 hour(s)). No results found.     Assessment and Plan: 77 y.o. female with ***   PDMP not reviewed this encounter. No orders of the defined types were placed in this encounter.  No orders of the defined types were placed in this encounter.    Discussed warning signs or symptoms. Please see discharge instructions. Patient expresses understanding.   ***

## 2023-11-05 ENCOUNTER — Ambulatory Visit: Payer: Medicare Other | Admitting: Family Medicine

## 2023-11-05 ENCOUNTER — Encounter: Payer: Self-pay | Admitting: Family Medicine

## 2023-11-05 VITALS — BP 114/82 | HR 62 | Ht 62.0 in | Wt 125.0 lb

## 2023-11-05 DIAGNOSIS — Z87310 Personal history of (healed) osteoporosis fracture: Secondary | ICD-10-CM | POA: Diagnosis not present

## 2023-11-05 DIAGNOSIS — M810 Age-related osteoporosis without current pathological fracture: Secondary | ICD-10-CM

## 2023-11-05 LAB — COMPREHENSIVE METABOLIC PANEL
ALT: 34 U/L (ref 0–35)
AST: 36 U/L (ref 0–37)
Albumin: 4 g/dL (ref 3.5–5.2)
Alkaline Phosphatase: 70 U/L (ref 39–117)
BUN: 25 mg/dL — ABNORMAL HIGH (ref 6–23)
CO2: 28 meq/L (ref 19–32)
Calcium: 9.3 mg/dL (ref 8.4–10.5)
Chloride: 106 meq/L (ref 96–112)
Creatinine, Ser: 0.73 mg/dL (ref 0.40–1.20)
GFR: 80.31 mL/min (ref >60.00)
Glucose, Bld: 90 mg/dL (ref 70–99)
Potassium: 4.5 meq/L (ref 3.5–5.1)
Sodium: 142 meq/L (ref 135–145)
Total Bilirubin: 0.4 mg/dL (ref 0.2–1.2)
Total Protein: 5.8 g/dL — ABNORMAL LOW (ref 6.0–8.3)

## 2023-11-05 LAB — MAGNESIUM: Magnesium: 2 mg/dL (ref 1.5–2.5)

## 2023-11-05 LAB — PHOSPHORUS: Phosphorus: 4 mg/dL (ref 2.3–4.6)

## 2023-11-05 LAB — VITAMIN D 25 HYDROXY (VIT D DEFICIENCY, FRACTURES): VITD: 32.17 ng/mL (ref 30.00–100.00)

## 2023-11-05 NOTE — Patient Instructions (Signed)
Thank you for coming in today.   Please get labs today before you leave   We will work to get Prolia authorized

## 2023-11-07 ENCOUNTER — Telehealth: Payer: Self-pay

## 2023-11-07 NOTE — Telephone Encounter (Signed)
-----   Message from Adron Bene sent at 11/05/2023 10:47 AM EST ----- Regarding: OP Please work to [employer]

## 2023-11-08 NOTE — Progress Notes (Signed)
Labs are okay for Prolia.  Vitamin D is a touch low.  Consider taking 2000 to 5000 units of vitamin D over-the-counter daily.

## 2023-11-11 NOTE — Telephone Encounter (Signed)
Prior Authorization NOT required for PROLIA  BUY AND BILL      Primary MEDICARE  COVERAGE DETAILS: For the Primary MD Purchase access option, benefits are subject to a $240 deductible ($240 met) and 20% co-insurance for the administration and cost of Prolia.We have provided in network benefits only.   Secondary: [employer]na Medicare Supp Plan G  COVERAGE DETAILS: For the Secondary MD Purchase access option, this plan is a Medicare Supplement Plan G and it covers the Medicare Part B co-insurance and 100% of the excess charges. This plan does not cover the Medicare Part B deductible.

## 2023-11-11 NOTE — Telephone Encounter (Signed)
Pt ready for scheduling on or after 11/12/2023  Out-of-pocket cost due at time of visit: $346  Primary: Medicare Prolia co-insurance: 20% (approximately $320.99) Admin fee co-insurance: 20% (approximately $25)  Deductible: $240 of $240 met  Prior Auth NOT required  Secondary: [employer]na Medicare Supplement Plan G Prolia co-insurance: Covers Medicare Part B co-insurance Admin fee co-insurance: Covers Medicare Part B co-insurance  Deductible: does NOT cover Medicare deductible  Prior Auth: NOT required PA# Valid:   ** This summary of benefits is an estimation of the patient's out-of-pocket cost. Exact cost may vary based on individual plan coverage.

## 2023-11-12 NOTE — Telephone Encounter (Signed)
Left message for patient to call back to schedule.  °

## 2023-11-12 NOTE — Telephone Encounter (Signed)
 Scheduled 11/20/2023

## 2023-11-20 ENCOUNTER — Ambulatory Visit: Payer: Medicare Other

## 2023-11-20 DIAGNOSIS — M810 Age-related osteoporosis without current pathological fracture: Secondary | ICD-10-CM | POA: Diagnosis not present

## 2023-11-20 MED ORDER — DENOSUMAB 60 MG/ML ~~LOC~~ SOSY
60.0000 mg | PREFILLED_SYRINGE | Freq: Once | SUBCUTANEOUS | Status: AC
  Administered 2023-11-20: 60 mg via SUBCUTANEOUS

## 2023-11-20 NOTE — Progress Notes (Signed)
Patient received Prolia injection in Right arm SubQ per Dr. Denyse Amass. Patient tolerated injection well

## 2023-11-25 NOTE — Telephone Encounter (Signed)
Last Prolia inj 11/20/23 Next Prolia inj due 05/21/24

## 2024-05-05 NOTE — Telephone Encounter (Signed)
 Prolia  VOB initiated via [email or web address]  Next Prolia  inj DUE: 05/21/24

## 2024-05-06 NOTE — Telephone Encounter (Signed)
 Medical Buy and Raenette Bumps  Patient is ready for scheduling on or after 05/21/24  Out-of-pocket cost due at time of visit: $0  Primary: [place]  Medicare Prolia  co-insurance: 20% (approximately $331.87) Admin fee co-insurance: 20% (approximately $25)  Deductible: does not apply  Prior Auth: NOT required  Secondary: Aetna Medicare Supplement Prolia  co-insurance: Covers Medicare Part B co-insurance Admin fee co-insurance: Covers Medicare Part B co-insurance  Deductible: does NOT cover Medicare Deductible of which $257 of $257 has been met  Prior Auth: NOT required PA#  Valid:    ** This summary of benefits is an estimation of the patient's out-of-pocket cost. Exact cost may vary based on individual plan coverage.

## 2024-05-06 NOTE — Telephone Encounter (Signed)
 Medical Buy and Annette Stable - Prior Authorization NOT required for [employer]

## 2024-05-21 ENCOUNTER — Ambulatory Visit: Payer: Medicare Other

## 2024-05-26 ENCOUNTER — Ambulatory Visit (INDEPENDENT_AMBULATORY_CARE_PROVIDER_SITE_OTHER)

## 2024-05-26 DIAGNOSIS — M810 Age-related osteoporosis without current pathological fracture: Secondary | ICD-10-CM | POA: Diagnosis not present

## 2024-05-26 DIAGNOSIS — Z87310 Personal history of (healed) osteoporosis fracture: Secondary | ICD-10-CM

## 2024-05-26 MED ORDER — DENOSUMAB 60 MG/ML ~~LOC~~ SOSY
60.0000 mg | PREFILLED_SYRINGE | Freq: Once | SUBCUTANEOUS | Status: AC
  Administered 2024-05-26: 60 mg via SUBCUTANEOUS

## 2024-05-26 NOTE — Progress Notes (Signed)
 Patient received PROLIA  injection today, SQ, right arm, tolerated well.   Medical Buy and [facility] / Pharmacy Benefit  MFG: [employer] LOT#: [id number] EXP: [id number] NDC: [id number]    Medical Buy and Bill   Patient is ready for scheduling on or after 05/21/24   Out-of-pocket cost due at time of visit: $0   Primary: [place]  Medicare Prolia  co-insurance: 20% (approximately $331.87) Admin fee co-insurance: 20% (approximately $25)   Deductible: does not apply   Prior Auth: NOT required   Secondary: Aetna Medicare Supplement Prolia  co-insurance: Covers Medicare Part B co-insurance Admin fee co-insurance: Covers Medicare Part B co-insurance   Deductible: does NOT cover Medicare Deductible of which $257 of $257 has been met   Prior Auth: NOT required PA#  Valid:     ** This summary of benefits is an estimation of the patient's out-of-pocket cost. Exact cost may vary based on individual plan coverage.

## 2024-05-26 NOTE — Telephone Encounter (Signed)
 Last Prolia inj 05/26/24 Next Prolia inj due 11/26/24

## 2024-10-20 NOTE — Telephone Encounter (Signed)
 Prolia  VOB initiated via [email or web address]  Next Prolia  inj DUE: 11/26/24

## 2024-10-21 NOTE — Telephone Encounter (Signed)
 Medical Buy and Bill  Prior Authorization NOT required for PROLIA   Primary: Medicare Secondary: Aetna Medicare Supplement

## 2024-10-21 NOTE — Telephone Encounter (Signed)
 Patient is scheduled 12/26/23.

## 2024-10-21 NOTE — Telephone Encounter (Signed)
 Medical Buy and [employer]ell  Patient is ready for scheduling on or after 11/26/24  Out-of-pocket cost due at time of visit: $0  Primary: [place]  Medicare Prolia  co-insurance: 20% (approximately $352.56) Admin fee co-insurance: 20% (approximately $25)  Deductible: $257 of $257 met  Prior Auth: NOT required  Secondary: Aetna Medicare Supplement Plan G Prolia  co-insurance: Covers Medicare Part B co-insurance Admin fee co-insurance: Covers Medicare Part B co-insurance  Deductible: does NOT cover Medicare deductible of which $257 of $257 has been met  Prior Auth: NOT required PA# Valid:   ** This summary of benefits is an estimation of the patient's out-of-pocket cost. Exact cost may vary based on individual plan coverage.

## 2024-11-22 ENCOUNTER — Emergency Department (HOSPITAL_BASED_OUTPATIENT_CLINIC_OR_DEPARTMENT_OTHER)

## 2024-11-22 ENCOUNTER — Encounter (HOSPITAL_BASED_OUTPATIENT_CLINIC_OR_DEPARTMENT_OTHER): Payer: Self-pay

## 2024-11-22 ENCOUNTER — Other Ambulatory Visit: Payer: Self-pay

## 2024-11-22 ENCOUNTER — Emergency Department (HOSPITAL_BASED_OUTPATIENT_CLINIC_OR_DEPARTMENT_OTHER)
Admission: EM | Admit: 2024-11-22 | Discharge: 2024-11-22 | Disposition: A | Discharge status: Home / Self Care | Attending: Emergency Medicine | Admitting: Emergency Medicine

## 2024-11-22 DIAGNOSIS — J0100 Acute maxillary sinusitis, unspecified: Secondary | ICD-10-CM | POA: Insufficient documentation

## 2024-11-22 DIAGNOSIS — I10 Essential (primary) hypertension: Secondary | ICD-10-CM | POA: Insufficient documentation

## 2024-11-22 DIAGNOSIS — R519 Headache, unspecified: Secondary | ICD-10-CM

## 2024-11-22 LAB — COMPREHENSIVE METABOLIC PANEL WITH GFR
ALT: 34 U/L (ref 0–44)
AST: 40 U/L (ref 15–41)
Albumin: 4.1 g/dL (ref 3.5–5.0)
Alkaline Phosphatase: 73 U/L (ref 38–126)
Anion gap: 11 (ref 5–15)
BUN: 17 mg/dL (ref 8–23)
CO2: 26 mmol/L (ref 22–32)
Calcium: 9.3 mg/dL (ref 8.9–10.3)
Chloride: 105 mmol/L (ref 98–111)
Creatinine, Ser: 0.61 mg/dL (ref 0.44–1.00)
GFR, Estimated: >60 mL/min
Glucose, Bld: 92 mg/dL (ref 70–99)
Potassium: 4.3 mmol/L (ref 3.5–5.1)
Sodium: 141 mmol/L (ref 135–145)
Total Bilirubin: 0.2 mg/dL (ref 0.0–1.2)
Total Protein: 6.3 g/dL — ABNORMAL LOW (ref 6.5–8.1)

## 2024-11-22 LAB — CBC WITH DIFFERENTIAL/PLATELET
Abs Immature Granulocytes: 0.07 K/uL (ref 0.00–0.07)
Basophils Absolute: 0.1 K/uL (ref 0.0–0.1)
Basophils Relative: 1 %
Eosinophils Absolute: 0.1 K/uL (ref 0.0–0.5)
Eosinophils Relative: 1 %
HCT: 36.8 % (ref 36.0–46.0)
Hemoglobin: 12.1 g/dL (ref 12.0–15.0)
Immature Granulocytes: 1 %
Lymphocytes Relative: 22 %
Lymphs Abs: 2.1 K/uL (ref 0.7–4.0)
MCH: 31.8 pg (ref 26.0–34.0)
MCHC: 32.9 g/dL (ref 30.0–36.0)
MCV: 96.8 fL (ref 80.0–100.0)
Monocytes Absolute: 0.9 K/uL (ref 0.1–1.0)
Monocytes Relative: 9 %
Neutro Abs: 6.5 K/uL (ref 1.7–7.7)
Neutrophils Relative %: 66 %
Platelets: 351 K/uL (ref 150–400)
RBC: 3.8 MIL/uL — ABNORMAL LOW (ref 3.87–5.11)
RDW: 13.7 % (ref 11.5–15.5)
WBC: 9.8 K/uL (ref 4.0–10.5)
nRBC: 0 % (ref 0.0–0.2)

## 2024-11-22 LAB — RESP PANEL BY RT-PCR (RSV, FLU A&B, COVID)  RVPGX2
Influenza A by PCR: NEGATIVE
Influenza B by PCR: NEGATIVE
Resp Syncytial Virus by PCR: NEGATIVE
SARS Coronavirus 2 by RT PCR: NEGATIVE

## 2024-11-22 MED ORDER — CEFDINIR 300 MG PO CAPS: 300.0000 mg | ORAL_CAPSULE | Freq: Two times a day (BID) | ORAL | 0 refills | Status: AC

## 2024-11-22 NOTE — Discharge Instructions (Signed)
 See your Physician for recheck of your blood pressure.  Return if any problems.

## 2024-11-22 NOTE — ED Provider Notes (Signed)
 " [place] EMERGENCY DEPARTMENT AT [facility] Provider Note   CSN: [id number] Arrival date & time: 11/22/24  1520     Patient presents with: Hypertension   Erin Carroll is a 78 y.o. female.   Patient complains of a headache, sinus congestion sinus infection and elevated blood pressure.  Patient recently had surgery on her eyelids.  Patient reports the surgery was to lift her eyelids patient has a history of epiphoria.  Patient reports having bilateral lower eyelid retraction.  Patient reports that she was diagnosed with a sinus infection 4 days ago and has been on Augmentin.  Patient complains of feeling weak feeling fatigued.  Patient states that she has a headache on the right side.  Patient still has facial swelling and facial bruising from the surgery.  Patient was seen at urgent care and advised to come to the emergency department for evaluation.  Patient reports that her blood pressure has been high recently.  Patient states her blood pressure was elevated at home today she has not had high blood pressure in the past.   Hypertension       Prior to Admission medications  Medication Sig Start Date End Date Taking? Authorizing Provider  cefdinir  (OMNICEF ) 300 MG capsule Take 1 capsule (300 mg total) by mouth 2 (two) times daily. 11/22/24  Yes Savannaha Stonerock K, PA-C  celecoxib  (CELEBREX ) 200 MG capsule Take 200 mg by mouth daily. 11/04/14   [provider]  estradiol (ESTRACE) 0.1 MG/GM vaginal cream INSERT ONE-HALF APPLICATORSFUL TWICE A WEEK BY VAGINAL ROUTE 08/25/22   [provider]  SYNTHROID  50 MCG tablet Take 50 mcg by mouth. 08/22/23   [provider]    Allergies: Patient has no known allergies.    Review of Systems  All other systems reviewed and are negative.   Updated Vital Signs BP (!) 147/89   Pulse (!) 59   Temp 97.9 F (36.6 C) (Oral)   Resp 19   SpO2 99%   Physical Exam Vitals and nursing note reviewed.   Constitutional:      Appearance: She is well-developed.  HENT:     Head: Normocephalic.     Comments: Bruising lower face, healing surgical incision with sutures in place bilateral forehead temporal area, no sign of infection no tenderness no redness    Right Ear: Tympanic membrane normal.     Left Ear: Tympanic membrane normal.     Nose: Nose normal.     Mouth/Throat:     Mouth: Mucous membranes are moist.  Eyes:     Extraocular Movements: Extraocular movements intact.     Conjunctiva/sclera: Conjunctivae normal.     Pupils: Pupils are equal, round, and reactive to light.  Cardiovascular:     Rate and Rhythm: Normal rate.  Pulmonary:     Effort: Pulmonary effort is normal.  Abdominal:     General: There is no distension.  Musculoskeletal:        General: Normal range of motion.     Cervical back: Normal range of motion.  Skin:    General: Skin is warm.  Neurological:     General: No focal deficit present.     Mental Status: She is alert and oriented to person, place, and time.  Psychiatric:        Mood and Affect: Mood normal.     (all labs ordered are listed, but only abnormal results are displayed) Labs Reviewed  CBC WITH DIFFERENTIAL/PLATELET - Abnormal; Notable for  the following components:      Result Value   RBC 3.80 (*)    All other components within normal limits  COMPREHENSIVE METABOLIC PANEL WITH GFR - Abnormal; Notable for the following components:   Total Protein 6.3 (*)    All other components within normal limits  RESP PANEL BY RT-PCR (RSV, FLU A&B, COVID)  [id number]    EKG: None  Radiology: CT Maxillofacial Wo Contrast Result Date: 11/22/2024 CLINICAL DATA:  Provided history: Head trauma, GCS=15, severe headache (Ped 2-17y) Patient reports blurry vision, headache since Friday. Sinus pressure. Patient reports eye surgery 10 days ago. EXAM: CT MAXILLOFACIAL WITHOUT CONTRAST TECHNIQUE: Multidetector CT imaging of the maxillofacial structures was  performed. Multiplanar CT image reconstructions were also generated. RADIATION DOSE REDUCTION: This exam was performed according to the departmental dose-optimization program which includes automated exposure control, adjustment of the mA and/or kV according to patient size and/or use of iterative reconstruction technique. COMPARISON:  Face CT 05/01/2022 FINDINGS: Osseous: No fracture or mandibular dislocation. No destructive process. Degenerative change of the left temporomandibular joint there is an impacted left upper tooth. The nasal septum is midline Orbits: Negative. No traumatic or inflammatory finding. Sinuses: No acute fracture. Bilateral maxillary sinus mucosal thickening with moderate fluid level in the right maxillary sinus. No hemosinus. Partial opacification of scattered ethmoid air cells. No mastoid effusion. Soft tissues: Negative. Limited intracranial: Assessed on concurrent head CT, reported separately. IMPRESSION: 1. No acute facial bone fracture. 2. Bilateral maxillary sinus mucosal thickening with moderate fluid level in the right maxillary sinus, can be seen with acute sinusitis. Electronically Signed   By: Andrea Gasman M.D.   On: 11/22/2024 19:37   CT Head Wo Contrast Result Date: 11/22/2024 CLINICAL DATA:  Provided history: Headache, new onset (Age >= 51y) Patient reports blurry vision, headache since Friday. Patient reports eye surgery 10 days ago. EXAM: CT HEAD WITHOUT CONTRAST TECHNIQUE: Contiguous axial images were obtained from the base of the skull through the vertex without intravenous contrast. RADIATION DOSE REDUCTION: This exam was performed according to the departmental dose-optimization program which includes automated exposure control, adjustment of the mA and/or kV according to patient size and/or use of iterative reconstruction technique. COMPARISON:  Head CT 05/01/2022 FINDINGS: Brain: No intracranial hemorrhage, mass effect, or midline shift. Age related atrophy. No  hydrocephalus. The basilar cisterns are patent. Mild periventricular and deep white matter hypodensity typical of chronic small vessel ischemia. No evidence of territorial infarct or acute ischemia. No extra-axial or intracranial fluid collection. Vascular: Atherosclerosis of skullbase vasculature without hyperdense vessel or abnormal calcification. Skull: No fracture or focal lesion. Sinuses/Orbits: Assessed on concurrent face CT, reported separately. Other: None. IMPRESSION: 1. No acute intracranial abnormality. 2. 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 Electronically Signed   By: Andrea Gasman M.D.   On: 11/22/2024 19:32   DG Chest 2 View Result Date: 11/22/2024 EXAM: 2 VIEW(S) XRAY OF THE CHEST 11/22/2024 04:43:00 PM COMPARISON: 01/04/2022 CLINICAL HISTORY: uri cough FINDINGS: LUNGS AND PLEURA: Hyperinflated lungs. No focal pulmonary opacity. No pleural effusion. No pneumothorax. HEART AND MEDIASTINUM: Tortuous, ectatic aorta. No acute abnormality of the cardiac and mediastinal silhouettes. BONES AND SOFT TISSUES: Thoracic osteophytosis. Osteopenia. No acute osseous abnormality. IMPRESSION: 1. No acute cardiopulmonary abnormality. Electronically signed by: Rogelia Myers MD 11/22/2024 04:51 PM EST RP Workstation: [id number]     Procedures   Medications Ordered in the ED - No data to display  Medical Decision Making Patient reports she recently has had high blood pressure.  Patient complains of a headache and sinus pain she is currently on Augmentin  Amount and/or Complexity of Data Reviewed Labs: ordered. Decision-making details documented in ED Course.    Details: Labs ordered reviewed and interpreted influenza COVID and RSV are negative.  CBC shows normal white blood cell count chemistries are normal Radiology: ordered and independent interpretation performed. Decision-making details documented in ED Course.    Details: CT head shows no  acute findings CT maxillary shows bilateral maxillary sinus thickening with moderate fluid levels in the right maxillary sinus  Risk Prescription drug management. Risk Details: Patient this pain correlates with the right sided maxillary sinus fluid.  Patient's blood pressure has normalized while in the emergency department.  Patient is advised to schedule to see her primary care physician for recheck of her blood pressure.  Patient is advised to keep a diary of her blood pressure for her primary care physician.  Patient is changed to Omnicef  to see if this expedites treatment of her sinus infection.  Patient is discharged in stable condition        Final diagnoses:  Acute maxillary sinusitis, recurrence not specified  Nonintractable headache, unspecified chronicity pattern, unspecified headache type  Hypertension, unspecified type    ED Discharge Orders          Ordered    cefdinir  (OMNICEF ) 300 MG capsule  2 times daily        11/22/24 2000           An After Visit Summary was printed and given to the patient.     Flint Sonny POUR, PA-C 11/22/24 2017    Elnor Jayson LABOR, DO 11/30/24 1544  "

## 2024-11-22 NOTE — ED Triage Notes (Addendum)
 Sent by [facility] Elevated BP, fatigue, blurry vision, headache since Friday.   Also reports cough, congestion sinus pressure since Thursday.    Recent eye surgery 10 days ago.

## 2024-12-01 ENCOUNTER — Ambulatory Visit

## 2024-12-01 DIAGNOSIS — M810 Age-related osteoporosis without current pathological fracture: Secondary | ICD-10-CM

## 2024-12-01 MED ORDER — DENOSUMAB 60 MG/ML ~~LOC~~ SOSY
60.0000 mg | PREFILLED_SYRINGE | Freq: Once | SUBCUTANEOUS | Status: AC
  Administered 2024-12-01: 60 mg via SUBCUTANEOUS

## 2024-12-01 NOTE — Progress Notes (Cosign Needed)
 Patient received Prolia  injection in right arm today. Patient tolerated well.

## 2024-12-01 NOTE — Telephone Encounter (Signed)
 Last Prolia  inj 12/01/24 Next Prolia  inj due 06/02/25
